# Patient Record
Sex: Male | Born: 1975 | Race: White | Hispanic: No | Smoking: Heavy tobacco smoker
Health system: Southern US, Community
[De-identification: ages and names within clinical notes are randomized; demographics above are authoritative.]

## PROBLEM LIST (undated history)

## (undated) DIAGNOSIS — M545 Low back pain, unspecified: Secondary | ICD-10-CM

## (undated) DIAGNOSIS — G43909 Migraine, unspecified, not intractable, without status migrainosus: Secondary | ICD-10-CM

## (undated) DIAGNOSIS — F419 Anxiety disorder, unspecified: Secondary | ICD-10-CM

## (undated) DIAGNOSIS — F329 Major depressive disorder, single episode, unspecified: Secondary | ICD-10-CM

## (undated) DIAGNOSIS — E785 Hyperlipidemia, unspecified: Secondary | ICD-10-CM

## (undated) DIAGNOSIS — F32A Depression, unspecified: Secondary | ICD-10-CM

## (undated) DIAGNOSIS — I10 Essential (primary) hypertension: Secondary | ICD-10-CM

## (undated) DIAGNOSIS — G8929 Other chronic pain: Secondary | ICD-10-CM

## (undated) DIAGNOSIS — Z720 Tobacco use: Secondary | ICD-10-CM

## (undated) DIAGNOSIS — I2510 Atherosclerotic heart disease of native coronary artery without angina pectoris: Secondary | ICD-10-CM

## (undated) DIAGNOSIS — K219 Gastro-esophageal reflux disease without esophagitis: Secondary | ICD-10-CM

## (undated) HISTORY — DX: Anxiety disorder, unspecified: F41.9

## (undated) HISTORY — PX: MYRINGOTOMY WITH TUBE PLACEMENT: SHX5663

---

## 1998-01-01 ENCOUNTER — Emergency Department (HOSPITAL_COMMUNITY): Admission: EM | Admit: 1998-01-01 | Discharge: 1998-01-01 | Payer: Self-pay | Admitting: Emergency Medicine

## 2000-04-03 ENCOUNTER — Emergency Department (HOSPITAL_COMMUNITY): Admission: EM | Admit: 2000-04-03 | Discharge: 2000-04-03 | Payer: Self-pay | Admitting: Emergency Medicine

## 2008-02-14 ENCOUNTER — Emergency Department (HOSPITAL_COMMUNITY): Admission: EM | Admit: 2008-02-14 | Discharge: 2008-02-14 | Payer: Self-pay | Admitting: Emergency Medicine

## 2010-04-25 LAB — DIFFERENTIAL
Basophils Absolute: 0 10*3/uL (ref 0.0–0.1)
Basophils Relative: 0 % (ref 0–1)
Eosinophils Absolute: 0.1 10*3/uL (ref 0.0–0.7)
Eosinophils Relative: 1 % (ref 0–5)
Lymphocytes Relative: 35 % (ref 12–46)

## 2010-04-25 LAB — CBC
HCT: 51.2 % (ref 39.0–52.0)
MCHC: 33.6 g/dL (ref 30.0–36.0)
Platelets: 210 10*3/uL (ref 150–400)
RDW: 12 % (ref 11.5–15.5)

## 2010-04-25 LAB — POCT CARDIAC MARKERS
CKMB, poc: <1 ng/mL — ABNORMAL LOW (ref 1.0–8.0)
Troponin i, poc: <0.05 ng/mL (ref 0.00–0.09)
Troponin i, poc: <0.05 ng/mL (ref 0.00–0.09)

## 2012-07-10 ENCOUNTER — Encounter (HOSPITAL_COMMUNITY): Payer: Self-pay

## 2012-07-10 ENCOUNTER — Emergency Department (HOSPITAL_COMMUNITY)
Admission: EM | Admit: 2012-07-10 | Discharge: 2012-07-10 | Disposition: A | Discharge status: Home / Self Care | Payer: Self-pay | Attending: Emergency Medicine | Admitting: Emergency Medicine

## 2012-07-10 ENCOUNTER — Emergency Department (HOSPITAL_COMMUNITY): Payer: Self-pay

## 2012-07-10 DIAGNOSIS — G8929 Other chronic pain: Secondary | ICD-10-CM | POA: Insufficient documentation

## 2012-07-10 DIAGNOSIS — R002 Palpitations: Secondary | ICD-10-CM | POA: Insufficient documentation

## 2012-07-10 DIAGNOSIS — I10 Essential (primary) hypertension: Secondary | ICD-10-CM | POA: Insufficient documentation

## 2012-07-10 DIAGNOSIS — R51 Headache: Secondary | ICD-10-CM | POA: Insufficient documentation

## 2012-07-10 DIAGNOSIS — F17200 Nicotine dependence, unspecified, uncomplicated: Secondary | ICD-10-CM | POA: Insufficient documentation

## 2012-07-10 HISTORY — DX: Essential (primary) hypertension: I10

## 2012-07-10 LAB — CBC WITH DIFFERENTIAL/PLATELET
Basophils Absolute: 0 10*3/uL (ref 0.0–0.1)
Basophils Relative: 0 % (ref 0–1)
Eosinophils Absolute: 0.1 10*3/uL (ref 0.0–0.7)
HCT: 46.9 % (ref 39.0–52.0)
Hemoglobin: 16.8 g/dL (ref 13.0–17.0)
Lymphocytes Relative: 37 % (ref 12–46)
Lymphs Abs: 2.9 10*3/uL (ref 0.7–4.0)
Monocytes Absolute: 0.8 10*3/uL (ref 0.1–1.0)
Monocytes Relative: 11 % (ref 3–12)
Neutro Abs: 4 10*3/uL (ref 1.7–7.7)
Neutrophils Relative %: 51 % (ref 43–77)
RBC: 5.44 MIL/uL (ref 4.22–5.81)
WBC: 7.9 10*3/uL (ref 4.0–10.5)

## 2012-07-10 LAB — URINALYSIS, ROUTINE W REFLEX MICROSCOPIC
Glucose, UA: NEGATIVE mg/dL
Ketones, ur: NEGATIVE mg/dL
Leukocytes, UA: NEGATIVE
Protein, ur: NEGATIVE mg/dL

## 2012-07-10 LAB — BASIC METABOLIC PANEL
Calcium: 9.6 mg/dL (ref 8.4–10.5)
GFR calc non Af Amer: >90 mL/min (ref >90)
Glucose, Bld: 118 mg/dL — ABNORMAL HIGH (ref 70–99)
Sodium: 136 mEq/L (ref 135–145)

## 2012-07-10 LAB — TROPONIN I: Troponin I: <0.3 ng/mL (ref <0.30)

## 2012-07-10 MED ORDER — ACETAMINOPHEN 325 MG PO TABS
650.0000 mg | ORAL_TABLET | Freq: Once | ORAL | Status: AC
  Administered 2012-07-10: 650 mg via ORAL
  Filled 2012-07-10: qty 2

## 2012-07-10 MED ORDER — METOPROLOL TARTRATE 25 MG PO TABS
25.0000 mg | ORAL_TABLET | Freq: Two times a day (BID) | ORAL | Status: DC
  Administered 2012-07-10: 25 mg via ORAL
  Filled 2012-07-10: qty 1

## 2012-07-10 MED ORDER — LORAZEPAM 2 MG/ML IJ SOLN
0.5000 mg | Freq: Once | INTRAMUSCULAR | Status: AC
  Administered 2012-07-10: 0.5 mg via INTRAVENOUS
  Filled 2012-07-10: qty 1

## 2012-07-10 MED ORDER — METOPROLOL TARTRATE 25 MG PO TABS: 25.0000 mg | ORAL_TABLET | Freq: Two times a day (BID) | ORAL | Status: DC

## 2012-07-10 NOTE — ED Provider Notes (Signed)
History    CSN: [id number] Arrival date & time 07/10/12  0428  First MD Initiated Contact with Patient 07/10/12 [phone]     Chief Complaint  Patient presents with  . Hypertension  . Headache   (Consider location/radiation/quality/duration/timing/severity/associated sxs/prior Treatment) HPI 37 yo male presents to the ER with complaint of headache, palpitations.  Pt has had chronic headaches over the last several months, current headache ongoing for 1 week.  Headache is posterior occiput and upper neck.  Pt has been taking 1-3 goody powders and/or tylenol, ibuprofen daily for months for the headache.  Tonight as he was getting ready for bed he felt his heart racing and could not get comfortable in the bed due to headache.  Pt began to google his symptoms and became concerned that he may have an aneurysm.  Pt has h/o HTN, does not take lisinopril as prescribed.  Took his blood pressure on his home machine but it read EEEE.  Pt called 911 due to feeling dizzy, sob.  He received 324 aspirin from EMS, declined ntg.  Pt denies chest pain, only some arm tingling, dizziness, and palpitations.  Pt smokes 3 ppd, had had 2 cigarettes tonight due to stress/anxiety.  Pt reports his father died at [facility]: [facility] due to heart issues, feels it was due to medical error.  Pt reports his headache for a week has completely resolved.  He is still feeling palpitations.  Left arm and chest numbness comes and goes with position on the bed.  Pt seems very anxious.  Past Medical History  Diagnosis Date  . Hypertension    History reviewed. No pertinent past surgical history. No family history on file. History  Substance Use Topics  . Smoking status: Heavy Tobacco Smoker -- 3.00 packs/day    Types: Cigarettes  . Smokeless tobacco: Not on file  . Alcohol Use: 0.6 oz/week    1 Shots of liquor per week     Comment: tonight    Review of Systems  All other systems reviewed and are negative.    Allergies  Review of patient's  allergies indicates no known allergies.  Home Medications   Current Outpatient Rx  Name  Route  Sig  Dispense  Refill  . Aspirin-Acetaminophen-Caffeine (GOODY HEADACHE PO)   Oral   Take 1 Package by mouth every 8 (eight) hours as needed (headache or pain).          BP 165/111  Pulse 107  Temp(Src) 99.4 F (37.4 C) (Oral)  Resp 18  SpO2 98% Physical Exam  Nursing note and vitals reviewed. Constitutional: He is oriented to person, place, and time. He appears well-developed and well-nourished. He appears distressed.  HENT:  Head: Normocephalic and atraumatic.  Nose: Nose normal.  Mouth/Throat: Oropharynx is clear and moist.  Eyes: Conjunctivae and EOM are normal. Pupils are equal, round, and reactive to light.  Neck: Normal range of motion. Neck supple. No JVD present. No tracheal deviation present. No thyromegaly present.  Cardiovascular: Normal rate, regular rhythm, normal heart sounds and intact distal pulses.  Exam reveals no gallop and no friction rub.   No murmur heard. Pulmonary/Chest: Effort normal. No stridor. No respiratory distress. He has wheezes (very slight end expiratory wheeze). He has no rales. He exhibits no tenderness.  Abdominal: Soft. Bowel sounds are normal. He exhibits no distension and no mass. There is no tenderness. There is no rebound and no guarding.  Musculoskeletal: Normal range of motion. He exhibits no edema and no tenderness.  Lymphadenopathy:    He has no cervical adenopathy.  Neurological: He is alert and oriented to person, place, and time. He has normal reflexes. No cranial nerve deficit. He exhibits normal muscle tone. Coordination normal.  Skin: Skin is dry. No rash noted. No erythema. No pallor.  Psychiatric: His behavior is normal. Judgment and thought content normal.  anxious    ED Course  Procedures (including critical care time) Labs Reviewed  BASIC METABOLIC PANEL - Abnormal; Notable for the following:    Glucose, Bld 118 (*)     All other components within normal limits  CBC WITH DIFFERENTIAL  TROPONIN I  URINALYSIS, ROUTINE W REFLEX MICROSCOPIC  D-DIMER, QUANTITATIVE    Date: 07/10/2012  Rate: 106  Rhythm: sinus tachycardia  QRS Axis: normal  Intervals: normal  ST/T Wave abnormalities: normal  Conduction Disutrbances:none  Narrative Interpretation:   Old EKG Reviewed: none available   Dg Chest 2 View  07/10/2012   *RADIOLOGY REPORT*  Clinical Data: Chest pain.  Left arm pain.  Numbness.  Headache.  CHEST - 2 VIEW  Comparison: None.  Findings: Basilar atelectasis.  No airspace disease.  No effusion. There is no focal consolidation.  Trachea midline.  Mediastinal contours are within normal limits. Monitoring leads are projected over the chest.  Monitoring buttons projects over the chest as well.  IMPRESSION: No acute cardiopulmonary disease.   Original Report Authenticated By: Andreas Newport, M.D.   1. Chronic headache   2. Hypertension     MDM  37 yo male with chronic headaches, tension vs rebound.  Do not feel sxs are c/w aneurysmal type headache.  Palpitations tonight, anxiety most likely.  Will add on ddimer, but feel pt is low risk for PE.  Do not feel sxs are due to atypical ACS.     Olivia Mackie, MD 07/10/12 1800

## 2012-07-10 NOTE — ED Notes (Signed)
Pt st's he is feeling much better 

## 2012-07-10 NOTE — ED Notes (Signed)
D/c I/v 

## 2012-07-10 NOTE — ED Notes (Signed)
Pt to ed from home. Pt sts he was getting ready for bed and felt like his hr was increased.  C/o left arm numbness, headache.  enroute hr 110-114.  Declined nitro but did receive 324mg  asa.

## 2012-07-18 ENCOUNTER — Ambulatory Visit: Payer: Self-pay | Attending: Family Medicine | Admitting: Family Medicine

## 2012-07-18 VITALS — BP 146/101 | HR 105 | Temp 98.8°F | Resp 18 | Ht 69.5 in | Wt 278.0 lb

## 2012-07-18 DIAGNOSIS — K219 Gastro-esophageal reflux disease without esophagitis: Secondary | ICD-10-CM

## 2012-07-18 DIAGNOSIS — I10 Essential (primary) hypertension: Secondary | ICD-10-CM | POA: Insufficient documentation

## 2012-07-18 DIAGNOSIS — F411 Generalized anxiety disorder: Secondary | ICD-10-CM | POA: Insufficient documentation

## 2012-07-18 DIAGNOSIS — G4700 Insomnia, unspecified: Secondary | ICD-10-CM

## 2012-07-18 MED ORDER — LORAZEPAM 0.5 MG PO TABS: 0.5000 mg | ORAL_TABLET | Freq: Three times a day (TID) | ORAL | Status: DC | PRN

## 2012-07-18 MED ORDER — OMEPRAZOLE 20 MG PO CPDR: 20.0000 mg | DELAYED_RELEASE_CAPSULE | Freq: Every day | ORAL | Status: DC

## 2012-07-18 MED ORDER — LISINOPRIL-HYDROCHLOROTHIAZIDE 20-12.5 MG PO TABS: 1.0000 | ORAL_TABLET | Freq: Every day | ORAL | Status: DC

## 2012-07-18 MED ORDER — SERTRALINE HCL 50 MG PO TABS: 50.0000 mg | ORAL_TABLET | Freq: Every day | ORAL | Status: DC

## 2012-07-18 MED ORDER — BUSPIRONE HCL 7.5 MG PO TABS: 7.5000 mg | ORAL_TABLET | Freq: Two times a day (BID) | ORAL | Status: DC

## 2012-07-18 NOTE — Progress Notes (Signed)
Patient presents for hospital follow up for hypertension and anxiety; states he was at [facility] ED 1 week ago after panic attack that woke him up from his sleep; states he had another panic attack 4 days ago and went to [facility] ED with same symptoms. Patient states that he can't sleep and feels guilty about his father dying a few years ago and thinks he is going to die just like his dad.  Patient was given lopressor at [facility] ED; pt states he stopped taking lopressor because it made him feel like he was going to die with headaches.

## 2012-07-18 NOTE — Progress Notes (Signed)
Patient ID: Jason Morris, male   DOB: 11-Jun-1975, 37 y.o.   MRN: [id number]  CC: ER follow up   HPI: Pt reports panic and anxiety.  The patient has had panic episodes in the past.  He had one recently where he was started on medication for palpitations.  He's been taking metoprolol 25 mg twice a day.  He stopped taking the medication because he said it gave him significant side effects and worsening anxiety.  He stopped taking the medication yesterday.  He is now reporting that he is having palpitations.  He reports that his anxiety has been severe.  He also reports that he's having acid reflux symptoms.  He has burning in the center of his chest.  This is usually related to spicy foods.  No Known Allergies Past Medical History  Diagnosis Date  . Hypertension   . Anxiety    Current Outpatient Prescriptions on File Prior to Visit  Medication Sig Dispense Refill  . Aspirin-Acetaminophen-Caffeine (GOODY HEADACHE PO) Take 1 Package by mouth every 8 (eight) hours as needed (headache or pain).       No current facility-administered medications on file prior to visit.   History reviewed. No pertinent family history. History   Social History  . Marital Status: Married    Spouse Name: N/A    Number of Children: N/A  . Years of Education: N/A   Occupational History  . Not on file.   Social History Main Topics  . Smoking status: Heavy Tobacco Smoker -- 3.00 packs/day    Types: Cigarettes  . Smokeless tobacco: Not on file  . Alcohol Use: 0.6 oz/week    1 Shots of liquor per week     Comment: tonight  . Drug Use: Not on file  . Sexually Active: Not on file   Other Topics Concern  . Not on file   Social History Narrative  . No narrative on file    Review of Systems  Constitutional: Negative for fever, chills, diaphoresis, activity change, appetite change and fatigue.  HENT: Negative for ear pain, nosebleeds, congestion, facial swelling, rhinorrhea, neck pain, neck stiffness and ear  discharge.   Eyes: Negative for pain, discharge, redness, itching and visual disturbance.  Respiratory: Negative for cough, choking, chest tightness, shortness of breath, wheezing and stridor.   Cardiovascular: Negative for chest pain, palpitations and leg swelling.  Gastrointestinal: Negative for abdominal distention.  Genitourinary: Negative for dysuria, urgency, frequency, hematuria, flank pain, decreased urine volume, difficulty urinating and dyspareunia.  Musculoskeletal: Negative for back pain, joint swelling, arthralgias and gait problem.  Neurological: Negative for dizziness, tremors, seizures, syncope, facial asymmetry, speech difficulty, weakness, light-headedness, numbness and headaches.  Hematological: Negative for adenopathy. Does not bruise/bleed easily.  Psychiatric/Behavioral: significant anxiety noted  Objective:   Filed Vitals:   07/18/12 1531  BP: 146/101  Pulse: 105  Temp: 98.8 F (37.1 C)  Resp: 18    Physical Exam  Constitutional: Appears anxiouswell-developed and well-nourished. No distress.  HENT: Normocephalic. External right and left ear normal. Oropharynx is clear and moist.  Eyes: Conjunctivae and EOM are normal. PERRLA, no scleral icterus.  Neck: Normal ROM. Neck supple. No JVD. No tracheal deviation. No thyromegaly.  CVS: RRR, S1/S2 +, no murmurs, no gallops, no carotid bruit.  Pulmonary: Effort and breath sounds normal, no stridor, rhonchi, wheezes, rales.  Abdominal: Soft. BS +,  no distension, tenderness, rebound or guarding.  Musculoskeletal: Normal range of motion. No edema and no tenderness.  Lymphadenopathy: No lymphadenopathy  noted, cervical, inguinal. Neuro: Alert. Normal reflexes, muscle tone coordination. No cranial nerve deficit. Skin: Skin is warm and dry. No rash noted. Not diaphoretic. No erythema. No pallor.  Psychiatric: anxious young male, judgment, thought content normal.   Lab Results  Component Value Date   WBC 7.9 07/10/2012    HGB 16.8 07/10/2012   HCT 46.9 07/10/2012   MCV 86.2 07/10/2012   PLT 193 07/10/2012   Lab Results  Component Value Date   CREATININE 0.96 07/10/2012   BUN 7 07/10/2012   NA 136 07/10/2012   K 4.0 07/10/2012   CL 102 07/10/2012   CO2 23 07/10/2012    No results found for this basename: HGBA1C   Lipid Panel  No results found for this basename: chol, trig, hdl, cholhdl, vldl, ldlcalc     Assessment and plan:   Patient Active Problem List   Diagnosis Date Noted  . GERD (gastroesophageal reflux disease) 07/18/2012  . Unspecified essential hypertension 07/18/2012  . GAD (generalized anxiety disorder) 07/18/2012  . Insomnia 07/18/2012   This patient has untreated anxiety disorder.  He is very skeptical about treating his anxiety disorder at this time.  He is willing to try something to get some relief.  He has cut down on smoking cigarettes significantly.  We'll try sertraline 25 mg by mouth daily for 3 days increasing to 50 mg by mouth daily.  In addition have added buspirone 7.5 mg by mouth twice a day.  I also gave him a prescription for emergency lorazepam to use as needed for severe anxiety.  I gave him 20 tablets to use to try and avoid him having to go into the hospital for a panic episode.  In addition, I added omeprazole 20 mg by mouth daily for acid reflux treatment.  We also gave him a different blood pressure medication.  He will try Zestoretic 20/12.5 one by mouth daily.  He reports  he is taking this in the past and tolerated it for his hypertension.  Close followup with the patient recommended.I would like for him to return to clinic in the next few weeks for recheck.  the patient verbalized understanding  RTC scheduled  The patient was given clear instructions to go to ER or return to medical center if symptoms don't improve, worsen or new problems develop.  The patient verbalized understanding.  The patient was told to call to get any lab results if not heard anything in the next week.     Rodney Langton, MD, CDE, FAAFP [facility] [place], [place]

## 2012-07-18 NOTE — Patient Instructions (Addendum)
Diet for Gastroesophageal Reflux Disease, Adult Reflux (acid reflux) is when acid from your stomach flows up into the esophagus. When acid comes in contact with the esophagus, the acid causes irritation and soreness (inflammation) in the esophagus. When reflux happens often or so severely that it causes damage to the esophagus, it is called gastroesophageal reflux disease (GERD). Nutrition therapy can help ease the discomfort of GERD. FOODS OR DRINKS TO AVOID OR LIMIT  Smoking or chewing tobacco. Nicotine is one of the most potent stimulants to acid production in the gastrointestinal tract.  Caffeinated and decaffeinated coffee and black tea.  Regular or low-calorie carbonated beverages or energy drinks (caffeine-free carbonated beverages are allowed).   Strong spices, such as black pepper, white pepper, red pepper, cayenne, curry powder, and chili powder.  Peppermint or spearmint.  Chocolate.  High-fat foods, including meats and fried foods. Extra added fats including oils, butter, salad dressings, and nuts. Limit these to less than 8 tsp per day.  Fruits and vegetables if they are not tolerated, such as citrus fruits or tomatoes.  Alcohol.  Any food that seems to aggravate your condition. If you have questions regarding your diet, call your caregiver or a registered dietitian. OTHER THINGS THAT MAY HELP GERD INCLUDE:   Eating your meals slowly, in a relaxed setting.  Eating 5 to 6 small meals per day instead of 3 large meals.  Eliminating food for a period of time if it causes distress.  Not lying down until 3 hours after eating a meal.  Keeping the head of your bed raised 6 to 9 inches (15 to 23 cm) by using a foam wedge or blocks under the legs of the bed. Lying flat may make symptoms worse.  Being physically active. Weight loss may be helpful in reducing reflux in overweight or obese adults.  Wear loose fitting clothing EXAMPLE MEAL PLAN This meal plan is approximately  2,000 calories based on https://www.bernard.org/ meal planning guidelines. Breakfast   cup cooked oatmeal.  1 cup strawberries.  1 cup low-fat milk.  1 oz almonds. Snack  1 cup cucumber slices.  6 oz yogurt (made from low-fat or fat-free milk). Lunch  2 slice whole-wheat bread.  2 oz sliced [place].  2 tsp mayonnaise.  1 cup blueberries.  1 cup snap peas. Snack  6 whole-wheat crackers.  1 oz string cheese. Dinner   cup brown rice.  1 cup mixed veggies.  1 tsp olive oil.  3 oz grilled fish. Document Released: 12/25/2004 Document Revised: 03/19/2011 Document Reviewed: 11/10/2010 [facility] Patient Information 2014 [place], [place]. Hypertension As your heart beats, it forces blood through your arteries. This force is your blood pressure. If the pressure is too high, it is called hypertension (HTN) or high blood pressure. HTN is dangerous because you may have it and not know it. High blood pressure may mean that your heart has to work harder to pump blood. Your arteries may be narrow or stiff. The extra work puts you at risk for heart disease, stroke, and other problems.  Blood pressure consists of two numbers, a higher number over a lower, 110/72, for example. It is stated as "110 over 72." The ideal is below 120 for the top number (systolic) and under 80 for the bottom (diastolic). Write down your blood pressure today. You should pay close attention to your blood pressure if you have certain conditions such as:  Heart failure.  Prior heart attack.  Diabetes  Chronic kidney disease.  Prior stroke.  Multiple risk factors for heart disease. To see if you have HTN, your blood pressure should be measured while you are seated with your arm held at the level of the heart. It should be measured at least twice. A one-time elevated blood pressure reading (especially in the Emergency Department) does not mean that you need treatment. There may be conditions in which the blood  pressure is different between your right and left arms. It is important to see your caregiver soon for a recheck. Most people have essential hypertension which means that there is not a specific cause. This type of high blood pressure may be lowered by changing lifestyle factors such as:  Stress.  Smoking.  Lack of exercise.  Excessive weight.  Drug/tobacco/alcohol use.  Eating less salt. Most people do not have symptoms from high blood pressure until it has caused damage to the body. Effective treatment can often prevent, delay or reduce that damage. TREATMENT  When a cause has been identified, treatment for high blood pressure is directed at the cause. There are a large number of medications to treat HTN. These fall into several categories, and your caregiver will help you select the medicines that are best for you. Medications may have side effects. You should review side effects with your caregiver. If your blood pressure stays high after you have made lifestyle changes or started on medicines,   Your medication(s) may need to be changed.  Other problems may need to be addressed.  Be certain you understand your prescriptions, and know how and when to take your medicine.  Be sure to follow up with your caregiver within the time frame advised (usually within two weeks) to have your blood pressure rechecked and to review your medications.  If you are taking more than one medicine to lower your blood pressure, make sure you know how and at what times they should be taken. Taking two medicines at the same time can result in blood pressure that is too low. SEEK IMMEDIATE MEDICAL CARE IF:  You develop a severe headache, blurred or changing vision, or confusion.  You have unusual weakness or numbness, or a faint feeling.  You have severe chest or abdominal pain, vomiting, or breathing problems. MAKE SURE YOU:   Understand these instructions.  Will watch your condition.  Will get  help right away if you are not doing well or get worse. Document Released: 12/25/2004 Document Revised: 03/19/2011 Document Reviewed: 08/15/2007 [facility] Patient Information 2014 [place], [place]. Anxiety and Panic Attacks Your caregiver has informed you that you are having an anxiety or panic attack. There may be many forms of this. Most of the time these attacks come suddenly and without warning. They come at any time of day, including periods of sleep, and at any time of life. They may be strong and unexplained. Although panic attacks are very scary, they are physically harmless. Sometimes the cause of your anxiety is not known. Anxiety is a protective mechanism of the body in its fight or flight mechanism. Most of these perceived danger situations are actually nonphysical situations (such as anxiety over losing a job). CAUSES  The causes of an anxiety or panic attack are many. Panic attacks may occur in otherwise healthy people given a certain set of circumstances. There may be a genetic cause for panic attacks. Some medications may also have anxiety as a side effect. SYMPTOMS  Some of the most common feelings are:  Intense terror.  Dizziness, feeling faint.  Hot and cold  flashes.  Fear of going crazy.  Feelings that nothing is real.  Sweating.  Shaking.  Chest pain or a fast heartbeat (palpitations).  Smothering, choking sensations.  Feelings of impending doom and that death is near.  Tingling of extremities, this may be from over-breathing.  Altered reality (derealization).  Being detached from yourself (depersonalization). Several symptoms can be present to make up anxiety or panic attacks. DIAGNOSIS  The evaluation by your caregiver will depend on the type of symptoms you are experiencing. The diagnosis of anxiety or panic attack is made when no physical illness can be determined to be a cause of the symptoms. TREATMENT  Treatment to prevent anxiety and panic attacks may  include:  Avoidance of circumstances that cause anxiety.  Reassurance and relaxation.  Regular exercise.  Relaxation therapies, such as yoga.  Psychotherapy with a psychiatrist or therapist.  Avoidance of caffeine, alcohol and illegal drugs.  Prescribed medication. SEEK IMMEDIATE MEDICAL CARE IF:   You experience panic attack symptoms that are different than your usual symptoms.  You have any worsening or concerning symptoms. Document Released: 12/25/2004 Document Revised: 03/19/2011 Document Reviewed: 04/28/2009 [facility] Patient Information 2014 [place], [place].

## 2012-07-23 ENCOUNTER — Ambulatory Visit: Payer: Self-pay

## 2012-07-25 ENCOUNTER — Emergency Department (HOSPITAL_COMMUNITY)
Admission: EM | Admit: 2012-07-25 | Discharge: 2012-07-25 | Disposition: A | Discharge status: Home / Self Care | Payer: Self-pay | Attending: Emergency Medicine | Admitting: Emergency Medicine

## 2012-07-25 ENCOUNTER — Emergency Department (HOSPITAL_COMMUNITY): Payer: Self-pay

## 2012-07-25 ENCOUNTER — Encounter (HOSPITAL_COMMUNITY): Payer: Self-pay | Admitting: Family Medicine

## 2012-07-25 DIAGNOSIS — I10 Essential (primary) hypertension: Secondary | ICD-10-CM | POA: Insufficient documentation

## 2012-07-25 DIAGNOSIS — R079 Chest pain, unspecified: Secondary | ICD-10-CM

## 2012-07-25 DIAGNOSIS — F411 Generalized anxiety disorder: Secondary | ICD-10-CM | POA: Insufficient documentation

## 2012-07-25 DIAGNOSIS — R0789 Other chest pain: Secondary | ICD-10-CM | POA: Insufficient documentation

## 2012-07-25 DIAGNOSIS — Z79899 Other long term (current) drug therapy: Secondary | ICD-10-CM | POA: Insufficient documentation

## 2012-07-25 DIAGNOSIS — R51 Headache: Secondary | ICD-10-CM | POA: Insufficient documentation

## 2012-07-25 DIAGNOSIS — F419 Anxiety disorder, unspecified: Secondary | ICD-10-CM

## 2012-07-25 DIAGNOSIS — R110 Nausea: Secondary | ICD-10-CM | POA: Insufficient documentation

## 2012-07-25 DIAGNOSIS — F17200 Nicotine dependence, unspecified, uncomplicated: Secondary | ICD-10-CM | POA: Insufficient documentation

## 2012-07-25 LAB — BASIC METABOLIC PANEL
BUN: 8 mg/dL (ref 6–23)
Calcium: 9.8 mg/dL (ref 8.4–10.5)
Chloride: 97 mEq/L (ref 96–112)
Creatinine, Ser: 0.98 mg/dL (ref 0.50–1.35)
GFR calc Af Amer: >90 mL/min (ref >90)
GFR calc non Af Amer: >90 mL/min (ref >90)

## 2012-07-25 LAB — CBC
HCT: 46 % (ref 39.0–52.0)
MCHC: 36.5 g/dL — ABNORMAL HIGH (ref 30.0–36.0)
MCV: 85.2 fL (ref 78.0–100.0)
Platelets: 242 10*3/uL (ref 150–400)
RDW: 11.8 % (ref 11.5–15.5)
WBC: 10.9 10*3/uL — ABNORMAL HIGH (ref 4.0–10.5)

## 2012-07-25 LAB — POCT I-STAT TROPONIN I: Troponin i, poc: 0.01 ng/mL (ref 0.00–0.08)

## 2012-07-25 MED ORDER — GI COCKTAIL ~~LOC~~
30.0000 mL | Freq: Once | ORAL | Status: AC
  Administered 2012-07-25: 30 mL via ORAL
  Filled 2012-07-25: qty 30

## 2012-07-25 NOTE — ED Notes (Signed)
Pt c/o weakness x1 day, pt reports the weakness started after becoming "anxious" while inside a crowded [employer], pt states he has had several similar episodes over the last 2 weeks and symptoms resolved with Ativan. Pt also c/o intermittent chest wall pain x2 weeks and a posterior headache x 1 month. Pt reports his chest pain resolves w/antacid medications

## 2012-07-25 NOTE — ED Provider Notes (Signed)
History    CSN: [id number] Arrival date & time 07/25/12  1251  First MD Initiated Contact with Patient 07/25/12 1756     Chief Complaint  Patient presents with  . Weakness   (Consider location/radiation/quality/duration/timing/severity/associated sxs/prior Treatment) HPI Comments: Patient presents with multiple complaints. Patient complains of occipital headache that he has had intermittently for the past several weeks. This has not been associated with vision change but the patient has had intermittent left-sided upper extremity and lower extremity weakness that has been improved with Ativan. He is uncertain but thinks it may be related to panic attacks. Patient also complains of intermittent, left-sided, burning chest pain that does not radiate. Patient has a history of hypertension and strong family history of coronary artery disease with several first and second degree male relatives who had heart attacks in their 15s. He denies nausea or shortness of breath. Patient has had recent ED visit for panic attack and palpitations. Patient saw internal medicine physician for followup and was started on Zoloft and that his blood pressure medication changed. He is afraid that he has a brain tumor, brain aneurysm, and is having a heart attack. The onset of this condition was acute. The course is intermittent. Aggravating factors: none. Alleviating factors: benzo.    Patient is a 37 y.o. male presenting with weakness. The history is provided by the patient and medical records.  Weakness Associated symptoms include chest pain, headaches, nausea and weakness. Pertinent negatives include no abdominal pain, congestion, coughing, diaphoresis, fever, neck pain, numbness, rash or vomiting.   Past Medical History  Diagnosis Date  . Hypertension   . Anxiety    History reviewed. No pertinent past surgical history. History reviewed. No pertinent family history. History  Substance Use Topics  . Smoking  status: Heavy Tobacco Smoker -- 3.00 packs/day    Types: Cigarettes  . Smokeless tobacco: Not on file  . Alcohol Use: 0.6 oz/week    1 Shots of liquor per week     Comment: tonight    Review of Systems  Constitutional: Negative for fever and diaphoresis.  HENT: Negative for congestion, rhinorrhea, neck pain, neck stiffness, dental problem and sinus pressure.   Eyes: Negative for photophobia, discharge, redness and visual disturbance.  Respiratory: Negative for cough and shortness of breath.   Cardiovascular: Positive for chest pain. Negative for palpitations and leg swelling.  Gastrointestinal: Positive for nausea. Negative for vomiting and abdominal pain.  Genitourinary: Negative for dysuria.  Musculoskeletal: Negative for back pain and gait problem.  Skin: Negative for rash.  Neurological: Positive for weakness and headaches. Negative for syncope, speech difficulty, light-headedness and numbness.  Psychiatric/Behavioral: Negative for confusion. The patient is nervous/anxious.     Allergies  Lopressor  Home Medications   Current Outpatient Rx  Name  Route  Sig  Dispense  Refill  . Aspirin-Acetaminophen-Caffeine (GOODY HEADACHE PO)   Oral   Take 1 Package by mouth every 8 (eight) hours as needed (headache or pain).         . busPIRone (BUSPAR) 7.5 MG tablet   Oral   Take 1 tablet (7.5 mg total) by mouth 2 (two) times daily.   60 tablet   1   . lisinopril-hydrochlorothiazide (ZESTORETIC) 20-12.5 MG per tablet   Oral   Take 1 tablet by mouth daily.   30 tablet   3   . LORazepam (ATIVAN) 0.5 MG tablet   Oral   Take 1 tablet (0.5 mg total) by mouth every 8 (eight) hours  as needed for anxiety.   20 tablet   0   . omeprazole (PRILOSEC) 20 MG capsule   Oral   Take 1 capsule (20 mg total) by mouth daily.   30 capsule   3   . ranitidine (ZANTAC) 150 MG tablet   Oral   Take 150 mg by mouth 2 (two) times daily as needed for heartburn.         . sertraline  (ZOLOFT) 50 MG tablet   Oral   Take 1 tablet (50 mg total) by mouth daily. Take 1/2 tab po daily for 3 days, then take 1 tab po daily   30 tablet   2    BP 136/84  Pulse 95  Temp(Src) 98.5 F (36.9 C) (Oral)  Resp 18  SpO2 97% Physical Exam  Nursing note and vitals reviewed. Constitutional: He is oriented to person, place, and time. He appears well-developed and well-nourished.  HENT:  Head: Normocephalic and atraumatic.  Right Ear: Tympanic membrane, external ear and ear canal normal.  Left Ear: Tympanic membrane, external ear and ear canal normal.  Nose: Nose normal.  Mouth/Throat: Uvula is midline, oropharynx is clear and moist and mucous membranes are normal. Mucous membranes are not dry.  Eyes: Conjunctivae, EOM and lids are normal. Pupils are equal, round, and reactive to light.  Neck: Trachea normal and normal range of motion. Neck supple. Normal carotid pulses and no JVD present. No muscular tenderness present. Carotid bruit is not present. No tracheal deviation present.  Cardiovascular: Normal rate, regular rhythm, S1 normal, S2 normal, normal heart sounds and intact distal pulses.  Exam reveals no distant heart sounds and no decreased pulses.   No murmur heard. Pulmonary/Chest: Effort normal and breath sounds normal. No respiratory distress. He has no wheezes. He exhibits no tenderness.  Abdominal: Soft. Normal aorta and bowel sounds are normal. There is no tenderness. There is no rebound and no guarding.  Musculoskeletal: Normal range of motion. He exhibits no edema.       Cervical back: He exhibits normal range of motion, no tenderness and no bony tenderness.  Neurological: He is alert and oriented to person, place, and time. He has normal strength and normal reflexes. No cranial nerve deficit or sensory deficit. He exhibits normal muscle tone. He displays a negative Romberg sign. Coordination and gait normal. GCS eye subscore is 4. GCS verbal subscore is 5. GCS motor  subscore is 6.  Skin: Skin is warm and dry. He is not diaphoretic. No cyanosis. No pallor.  Psychiatric: His mood appears anxious.    ED Course  Procedures (including critical care time) Labs Reviewed  CBC  BASIC METABOLIC PANEL   Ct Head Wo Contrast  07/25/2012   *RADIOLOGY REPORT*  Clinical Data: Headache, weakness, anxious  CT HEAD WITHOUT CONTRAST  Technique:  Contiguous axial images were obtained from the base of the skull through the vertex without contrast.  Comparison: None.  Findings: No evidence of parenchymal hemorrhage or extra-axial fluid collection. No mass lesion, mass effect, or midline shift.  No CT evidence of acute infarction.  Cerebral volume is age appropriate.  No ventriculomegaly.  The visualized paranasal sinuses are essentially clear. The mastoid air cells are unopacified.  No evidence of calvarial fracture.  IMPRESSION: Normal head CT.   Original Report Authenticated By: Charline Bills, M.D.   1. Anxiety   2. Chest pain   3. Headache     7:54 PM Patient seen and examined. Work-up initiated. Medications ordered. Patient  is very anxious. Discussed risks and benefits of head CT including radiation, possible need for further testing if negative. He wants a head CT.    Vital signs reviewed and are as follows: Filed Vitals:   07/25/12 1805  BP: 136/84  Pulse: 95  Temp:   Resp: 18    Date: 07/25/2012  Rate: 99  Rhythm: normal sinus rhythm  QRS Axis: normal  Intervals: normal  ST/T Wave abnormalities: nonspecific T wave changes  Conduction Disutrbances:none  Narrative Interpretation:   Old EKG Reviewed: changes noted, new borderline t-wave changes since 07/10/12  EKG d/w Dr. Rosalia Hammers.   8:19 PM CT negative, patient informed. He appears relieved. Awaiting blood test results. Patient reports much improvement in chest pain no GI cocktail.  Handoff to Dammen PA-C at shift change.   Plan: F/u on blood tests. If neg, d/c to home with PCP f/u.    MDM  Patient  with multiple complaints. Patient does have atypical chest pain and I suspect GERD/anxiety. Borderline EKG changes today. Chest pain was improved in emergency department with GI cocktail. Given family history, outpatient PCP followup is indicated. Patient does have PCP to follow up with. I do not suspect ACS as cause of his atypical pain. He is to continue Prilosec.  Patient previously had a d-dimer which was negative. Troponin today is pending.  Headache and weakness. CT was performed at patient request. This was negative the patient seems relieved. Do not suspect serious intracranial etiology such as stroke as symptoms were relieved with Ativan. Suspect anxiety as underlying cause. Again, agree with Zoloft and PCP followup. He may need MRI or neurology followup if headaches persist. Do not suspect meningitis.   Renne Crigler, PA-C 07/25/12 2024

## 2012-07-25 NOTE — ED Notes (Signed)
Pt dc'd home w/all belongings, alert and ambulatory upon dc, no new rx prescribed, pt driven home by family, pt verbalizes understanding of dc instructions

## 2012-07-25 NOTE — ED Notes (Signed)
Pt states he is scared to stay at home alone, states "I don't want to die in my sleep."

## 2012-07-25 NOTE — ED Provider Notes (Signed)
Jason Morris 8:00PM Pt discussed in sign out.  Pt with multiple complaints.  Unremarkable vital signs.  No concerning exam findings.  Head CT and CXR unremarkable.  Basic labs pending.  Labs unremarkable. Patient is reassured with the findings. He will plan to followup with PCP for continued evaluation and treatment.  Angus Seller, PA-C 07/26/12 [phone]

## 2012-07-25 NOTE — ED Notes (Addendum)
Per pt sts yesterday he was at [facility] and there were a lot of people and it made him anxious. sts since he has had left sided weakness. sts the weakness has some what resolved and when he walks he is okay. sts hasn't taken BP meds today. sts Ativan helps. sts he has also been having a lot of HA.

## 2012-07-26 NOTE — ED Provider Notes (Signed)
I have reviewed the report and personally reviewed the above radiology studies.    Rihana Kiddy S Lillyonna Armstead, MD 07/26/12 1508 

## 2012-07-26 NOTE — ED Provider Notes (Signed)
Medical screening examination/treatment/procedure(s) were performed by non-physician practitioner and as supervising physician I was immediately available for consultation/collaboration.    Nelia Shi, MD 07/26/12 1520

## 2012-07-28 ENCOUNTER — Inpatient Hospital Stay: Payer: Self-pay

## 2012-08-08 ENCOUNTER — Encounter (HOSPITAL_COMMUNITY): Payer: Self-pay | Admitting: Radiology

## 2012-08-08 ENCOUNTER — Ambulatory Visit: Payer: Self-pay | Attending: Family Medicine | Admitting: Internal Medicine

## 2012-08-08 ENCOUNTER — Ambulatory Visit: Payer: Self-pay | Attending: Family Medicine

## 2012-08-08 ENCOUNTER — Emergency Department (HOSPITAL_COMMUNITY): Payer: Self-pay

## 2012-08-08 ENCOUNTER — Encounter: Payer: Self-pay | Admitting: Family Medicine

## 2012-08-08 ENCOUNTER — Observation Stay (HOSPITAL_COMMUNITY)
Admission: EM | Admit: 2012-08-08 | Discharge: 2012-08-09 | Disposition: A | Discharge status: Home / Self Care | Payer: MEDICAID | Attending: Internal Medicine | Admitting: Internal Medicine

## 2012-08-08 VITALS — BP 141/88 | HR 88 | Temp 97.6°F | Resp 16 | Wt 265.6 lb

## 2012-08-08 DIAGNOSIS — F411 Generalized anxiety disorder: Secondary | ICD-10-CM | POA: Insufficient documentation

## 2012-08-08 DIAGNOSIS — I2584 Coronary atherosclerosis due to calcified coronary lesion: Secondary | ICD-10-CM | POA: Insufficient documentation

## 2012-08-08 DIAGNOSIS — R079 Chest pain, unspecified: Secondary | ICD-10-CM

## 2012-08-08 DIAGNOSIS — Z8249 Family history of ischemic heart disease and other diseases of the circulatory system: Secondary | ICD-10-CM | POA: Insufficient documentation

## 2012-08-08 DIAGNOSIS — I2510 Atherosclerotic heart disease of native coronary artery without angina pectoris: Secondary | ICD-10-CM | POA: Insufficient documentation

## 2012-08-08 DIAGNOSIS — M549 Dorsalgia, unspecified: Secondary | ICD-10-CM | POA: Insufficient documentation

## 2012-08-08 DIAGNOSIS — G8929 Other chronic pain: Secondary | ICD-10-CM | POA: Insufficient documentation

## 2012-08-08 DIAGNOSIS — Z23 Encounter for immunization: Secondary | ICD-10-CM | POA: Insufficient documentation

## 2012-08-08 DIAGNOSIS — F17200 Nicotine dependence, unspecified, uncomplicated: Secondary | ICD-10-CM | POA: Insufficient documentation

## 2012-08-08 DIAGNOSIS — I10 Essential (primary) hypertension: Secondary | ICD-10-CM | POA: Insufficient documentation

## 2012-08-08 DIAGNOSIS — I2541 Coronary artery aneurysm: Secondary | ICD-10-CM | POA: Insufficient documentation

## 2012-08-08 HISTORY — DX: Depression, unspecified: F32.A

## 2012-08-08 HISTORY — DX: Migraine, unspecified, not intractable, without status migrainosus: G43.909

## 2012-08-08 HISTORY — DX: Gastro-esophageal reflux disease without esophagitis: K21.9

## 2012-08-08 HISTORY — DX: Low back pain, unspecified: M54.50

## 2012-08-08 HISTORY — DX: Hyperlipidemia, unspecified: E78.5

## 2012-08-08 HISTORY — DX: Other chronic pain: G89.29

## 2012-08-08 HISTORY — DX: Major depressive disorder, single episode, unspecified: F32.9

## 2012-08-08 HISTORY — DX: Low back pain: M54.5

## 2012-08-08 HISTORY — DX: Atherosclerotic heart disease of native coronary artery without angina pectoris: I25.10

## 2012-08-08 LAB — POCT I-STAT TROPONIN I: Troponin i, poc: 0 ng/mL (ref 0.00–0.08)

## 2012-08-08 LAB — RAPID URINE DRUG SCREEN, HOSP PERFORMED
Barbiturates: POSITIVE — AB
Benzodiazepines: NOT DETECTED

## 2012-08-08 LAB — CBC
MCH: 30.8 pg (ref 26.0–34.0)
MCHC: 35.5 g/dL (ref 30.0–36.0)
MCV: 86.6 fL (ref 78.0–100.0)
Platelets: 213 10*3/uL (ref 150–400)
RBC: 4.91 MIL/uL (ref 4.22–5.81)
RDW: 12.1 % (ref 11.5–15.5)

## 2012-08-08 LAB — BASIC METABOLIC PANEL
CO2: 27 mEq/L (ref 19–32)
Calcium: 9.9 mg/dL (ref 8.4–10.5)
Creatinine, Ser: 1.02 mg/dL (ref 0.50–1.35)
GFR calc non Af Amer: >90 mL/min (ref >90)
Sodium: 135 mEq/L (ref 135–145)

## 2012-08-08 LAB — TROPONIN I: Troponin I: <0.3 ng/mL (ref <0.30)

## 2012-08-08 MED ORDER — IOHEXOL 350 MG/ML SOLN: 100.0000 mL | Freq: Once | INTRAVENOUS | Status: AC | PRN

## 2012-08-08 MED ORDER — LORAZEPAM 1 MG PO TABS: 1.0000 mg | ORAL_TABLET | Freq: Two times a day (BID) | ORAL | Status: DC

## 2012-08-08 MED ORDER — LORAZEPAM 1 MG PO TABS
1.0000 mg | ORAL_TABLET | Freq: Two times a day (BID) | ORAL | Status: DC
  Administered 2012-08-08: 1 mg via ORAL
  Filled 2012-08-08: qty 1

## 2012-08-08 MED ORDER — GI COCKTAIL ~~LOC~~
30.0000 mL | Freq: Once | ORAL | Status: AC
  Administered 2012-08-08: 30 mL via ORAL
  Filled 2012-08-08: qty 30

## 2012-08-08 MED ORDER — NICOTINE 21 MG/24HR TD PT24
21.0000 mg | MEDICATED_PATCH | Freq: Once | TRANSDERMAL | Status: DC
  Administered 2012-08-08: 21 mg via TRANSDERMAL
  Filled 2012-08-08 (×2): qty 1

## 2012-08-08 MED ORDER — LORAZEPAM 1 MG PO TABS
0.5000 mg | ORAL_TABLET | Freq: Once | ORAL | Status: AC
  Administered 2012-08-08: 0.5 mg via ORAL
  Filled 2012-08-08 (×2): qty 1

## 2012-08-08 MED ORDER — IOHEXOL 350 MG/ML SOLN
100.0000 mL | Freq: Once | INTRAVENOUS | Status: AC | PRN
  Administered 2012-08-08: 100 mL via INTRAVENOUS

## 2012-08-08 NOTE — Progress Notes (Signed)
Patient ID: Jason Morris, male   DOB: June 30, 1975, 37 y.o.   MRN: [id number]  CC:  HPI:  37 year old male who presents to the clinic for evaluation of his chest pain. He states that the chest pain has been there for 2 or 3 days, he describes as pain between her shoulder blades with radiation into the left arm, worse with exertion. The patient was recently in the ER on 7/18 and was diagnosed with panic attacks. The patient has several family members with coronary artery disease and heart attacks in their 38s. The patient has never had a stress test done. EKG in the clinic appears to be normal He denies to any ongoing drug use He states that he recently lost his job because of his health issues On 7/18 he had a normal EKG, negative d-dimer, negative troponin, CT of the head which was negative    Allergies  Allergen Reactions  . Lopressor (Metoprolol Tartrate) Other (See Comments)    Felt like he was going to die , could not breath   Past Medical History  Diagnosis Date  . Hypertension   . Anxiety    Current Outpatient Prescriptions on File Prior to Visit  Medication Sig Dispense Refill  . Aspirin-Acetaminophen-Caffeine (GOODY HEADACHE PO) Take 1 Package by mouth every 8 (eight) hours as needed (headache or pain).      . busPIRone (BUSPAR) 7.5 MG tablet Take 1 tablet (7.5 mg total) by mouth 2 (two) times daily.  60 tablet  1  . lisinopril-hydrochlorothiazide (ZESTORETIC) 20-12.5 MG per tablet Take 1 tablet by mouth daily.  30 tablet  3  . LORazepam (ATIVAN) 0.5 MG tablet Take 1 tablet (0.5 mg total) by mouth every 8 (eight) hours as needed for anxiety.  20 tablet  0  . omeprazole (PRILOSEC) 20 MG capsule Take 1 capsule (20 mg total) by mouth daily.  30 capsule  3  . ranitidine (ZANTAC) 150 MG tablet Take 150 mg by mouth 2 (two) times daily as needed for heartburn.      . sertraline (ZOLOFT) 50 MG tablet Take 1 tablet (50 mg total) by mouth daily. Take 1/2 tab po daily for 3 days, then take  1 tab po daily  30 tablet  2   No current facility-administered medications on file prior to visit.   History reviewed. No pertinent family history. History   Social History  . Marital Status: Divorced    Spouse Name: N/A    Number of Children: N/A  . Years of Education: N/A   Occupational History  . Not on file.   Social History Main Topics  . Smoking status: Heavy Tobacco Smoker -- 3.00 packs/day    Types: Cigarettes  . Smokeless tobacco: Not on file  . Alcohol Use: 0.6 oz/week    1 Shots of liquor per week     Comment: tonight  . Drug Use: Not on file  . Sexually Active: Not on file   Other Topics Concern  . Not on file   Social History Narrative  . No narrative on file    Review of Systems  Constitutional: Negative for fever, chills, diaphoresis, activity change, appetite change and fatigue.  HENT: Negative for ear pain, nosebleeds, congestion, facial swelling, rhinorrhea, neck pain, neck stiffness and ear discharge.   Eyes: Negative for pain, discharge, redness, itching and visual disturbance.  Respiratory: Negative for cough, choking, chest tightness, shortness of breath, wheezing and stridor.   Cardiovascular: Negative for chest pain, palpitations and  leg swelling.  Gastrointestinal: Negative for abdominal distention.  Genitourinary: Negative for dysuria, urgency, frequency, hematuria, flank pain, decreased urine volume, difficulty urinating and dyspareunia.  Musculoskeletal: Negative for back pain, joint swelling, arthralgias and gait problem.  Neurological: Negative for dizziness, tremors, seizures, syncope, facial asymmetry, speech difficulty, weakness, light-headedness, numbness and headaches.  Hematological: Negative for adenopathy. Does not bruise/bleed easily.  Psychiatric/Behavioral: Negative for hallucinations, behavioral problems, confusion, dysphoric mood, decreased concentration and agitation.    Objective:   Filed Vitals:   08/08/12 1325  BP:  141/88  Pulse: 88  Temp: 97.6 F (36.4 C)  Resp: 16    Physical Exam  Constitutional: Appears well-developed and well-nourished. No distress.  HENT: Normocephalic. External right and left ear normal. Oropharynx is clear and moist.  Eyes: Conjunctivae and EOM are normal. PERRLA, no scleral icterus.  Neck: Normal ROM. Neck supple. No JVD. No tracheal deviation. No thyromegaly.  CVS: RRR, S1/S2 +, no murmurs, no gallops, no carotid bruit.  Pulmonary: Effort and breath sounds normal, no stridor, rhonchi, wheezes, rales.  Abdominal: Soft. BS +,  no distension, tenderness, rebound or guarding.  Musculoskeletal: Normal range of motion. No edema and no tenderness.  Lymphadenopathy: No lymphadenopathy noted, cervical, inguinal. Neuro: Alert. Normal reflexes, muscle tone coordination. No cranial nerve deficit. Skin: Skin is warm and dry. No rash noted. Not diaphoretic. No erythema. No pallor.  Psychiatric: Normal mood and affect. Behavior, judgment, thought content normal.   Lab Results  Component Value Date   WBC 10.9* 07/25/2012   HGB 16.8 07/25/2012   HCT 46.0 07/25/2012   MCV 85.2 07/25/2012   PLT 242 07/25/2012   Lab Results  Component Value Date   CREATININE 0.98 07/25/2012   BUN 8 07/25/2012   NA 134* 07/25/2012   K 3.5 07/25/2012   CL 97 07/25/2012   CO2 27 07/25/2012    No results found for this basename: HGBA1C   Lipid Panel  No results found for this basename: chol, trig, hdl, cholhdl, vldl, ldlcalc       Assessment and plan:   Patient Active Problem List   Diagnosis Date Noted  . GERD (gastroesophageal reflux disease) 07/18/2012  . Unspecified essential hypertension 07/18/2012  . GAD (generalized anxiety disorder) 07/18/2012  . 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

## 2012-08-08 NOTE — ED Notes (Signed)
Pt states that he was diagnosed with HTN and told "not to quit smoking yet because they thought I was having a nervous breakdown.

## 2012-08-08 NOTE — ED Provider Notes (Signed)
CSN: [id number]     Arrival date & time 08/08/12  1416 History     First MD Initiated Contact with Patient 08/08/12 1503     Chief Complaint  Patient presents with  . Chest Pain   (Consider location/radiation/quality/duration/timing/severity/associated sxs/prior Treatment) HPI Complaints of chest pain present for the past 5 days, increasing in intensity, radiates to the back, describes it as a pole shooting straight through to his back, and constricting, worse on sitting and standing, better on lying flat on his back, assoc with palp, and diaphoresis, sense of something bad about to happen. Two days ago, he had an episode of dizziness with the chest pain. Symptoms feel better when he takes ativan. Px also endoses having panic attacks and anxiety, spasms on the left and right part of his temples, with pain near the angle of his left jaw. He previously smoked 3-4 packs a day, now down to 1/2 a pack, hx of HTN takes- HCT, lopressor and lisinopril, denies use of drugs. No cough or leg swelling, no fever, no hx of trauma to chest region. He said his father started having heart problems when he was about 38 years, and finally passed away at 30, also paternal grandfather and greatgrandfather had passed away at the same age. He is very scared he has started having the same cardiac issues as he is about the same age his father started having complaints and he thinks about it all the time.   Past Medical History  Diagnosis Date  . Hypertension   . Anxiety    No past surgical history on file. No family history on file. History  Substance Use Topics  . Smoking status: Heavy Tobacco Smoker -- 3.00 packs/day    Types: Cigarettes  . Smokeless tobacco: Not on file  . Alcohol Use: 0.6 oz/week    1 Shots of liquor per week     Comment: tonight    Review of Systems No pertinent findings on ROS.   Allergies  Lopressor  Home Medications   Current Outpatient Rx  Name  Route  Sig  Dispense  Refill   . lisinopril-hydrochlorothiazide (PRINZIDE,ZESTORETIC) 20-12.5 MG per tablet   Oral   Take 0.5 tablets by mouth 2 (two) times daily.         Marland Kitchen LORazepam (ATIVAN) 0.5 MG tablet   Oral   Take 1 tablet (0.5 mg total) by mouth every 8 (eight) hours as needed for anxiety.   20 tablet   0   . omeprazole (PRILOSEC) 20 MG capsule   Oral   Take 1 capsule (20 mg total) by mouth daily.   30 capsule   3   . sertraline (ZOLOFT) 50 MG tablet   Oral   Take 12.5 mg by mouth daily.          BP 107/69  Pulse 76  Temp(Src) 98.9 F (37.2 C) (Oral)  Resp 20  SpO2 100% Physical Exam Gen- alert, co-op, not in any distress,well built. Cardiac- RRR, no Murmurs, rubs or gallops, chest pain not reproducible on palpation. Resp -Clear to auscultation.  Abd- soft, non tender, no masses or organomegaly, NL bowel sounds Extremities- pulse 2+ symm, normal strenght. Neuro- OTPP, no obvious Cr N abnormalities.  ED Course   Procedures (including critical care time)  Labs Reviewed  CBC  BASIC METABOLIC PANEL  POCT I-STAT TROPONIN I   Dg Chest Port 1 View  08/08/2012   *RADIOLOGY REPORT*  Clinical Data: Chest pain  CHEST - 2  VIEW  Comparison:  07/25/2012  Findings:  The heart size and mediastinal contours are within normal limits.  Both lungs are clear.  The visualized skeletal structures are unremarkable.  IMPRESSION: No active cardiopulmonary disease.   Original Report Authenticated By: Judie Petit. Miles Costain, M.D.   No diagnosis found.  MDM   Chest pain- R/o ACS, aortic dissection- as he says the his pain radiates to his back and he has a hx of hypertension. Chest pain is most likely due to anxiety, px has a hx of GAD, panic attacks. - POC troponin- 1 Negative- 0.00. - EKG stat- rate- 84 bpm, regular, sinus rhythm. - BMP -Oral ativan- 1mg  BID. - chest Xray- no acute cardiopulmonary process, not concerning for aortic dissection. - Chest CT angiogram, R/o PE and Aortic dissection. - Urine drug screen -  Follow up with his PCP, and Psych for treatment treatment of anxiety symptoms. - Follow up stress test.  Kennis Carina, MD 08/08/12 2016

## 2012-08-08 NOTE — ED Notes (Signed)
[facility] with [facility] pain mid shoulder radiates to chest X 4 days. Pt denies SHOB, N, V dizziness, pt states that it "feels like gerd"  Pt has 3254 mg of asa.  20lock in lac. Better when lays down.

## 2012-08-08 NOTE — Progress Notes (Signed)
Patient presents with pain between his shoulder blades that radiates Down his left arm Has pain in the center of his chest-past 4 days Denies any injury

## 2012-08-08 NOTE — ED Provider Notes (Signed)
Jason Morris S 8:00 PM patient discussed in sign out with Jason Morris. Patient presenting from PCP office with continued chest pain for past 5 days. Patient does have significant family history for CAD. PCP note had recommended lab testing, CT scan to rule out PE and admission for further chest pain rule out. Patient currently awaiting a CT scan. Initial troponin negative. Abnormal EKG without any acute changes.  CT results are back and I spoke with the radiologist. No signs for PE however patient has very significant CAD throughout. No signs of complete blockage. We'll plan for hospital admission for continued chest pain and evaluation.  Spoke with Jason Morris with [facility] hospitalist.  He request I consult cardiology for admission.  Spoke Jason Morris with cardiology.  He will see pt and admit under Jason Morris to tele bed.     Jason Seller, PA-C 08/08/12 2246

## 2012-08-08 NOTE — ED Notes (Signed)
Spoke to mini lab regarding status of troponin. Per mini lab. Original troponin was collected and sent to main lab but not processed. Mini lab will recollect specimen d/t collection times.

## 2012-08-09 ENCOUNTER — Encounter (HOSPITAL_COMMUNITY): Payer: Self-pay | Admitting: General Practice

## 2012-08-09 ENCOUNTER — Other Ambulatory Visit: Payer: Self-pay | Admitting: Physician Assistant

## 2012-08-09 DIAGNOSIS — I2510 Atherosclerotic heart disease of native coronary artery without angina pectoris: Secondary | ICD-10-CM

## 2012-08-09 DIAGNOSIS — R079 Chest pain, unspecified: Secondary | ICD-10-CM

## 2012-08-09 DIAGNOSIS — I519 Heart disease, unspecified: Secondary | ICD-10-CM

## 2012-08-09 LAB — LIPID PANEL
Cholesterol: 222 mg/dL — ABNORMAL HIGH (ref 0–200)
Total CHOL/HDL Ratio: 9.3 RATIO
Triglycerides: 172 mg/dL — ABNORMAL HIGH (ref <150)
VLDL: 34 mg/dL (ref 0–40)

## 2012-08-09 LAB — HEMOGLOBIN A1C: Mean Plasma Glucose: 123 mg/dL — ABNORMAL HIGH (ref <117)

## 2012-08-09 MED ORDER — ATORVASTATIN CALCIUM 80 MG PO TABS: 80.0000 mg | ORAL_TABLET | Freq: Every day | ORAL | Status: DC

## 2012-08-09 MED ORDER — ASPIRIN 81 MG PO CHEW: 81.0000 mg | CHEWABLE_TABLET | Freq: Every day | ORAL | Status: DC

## 2012-08-09 MED ORDER — ASPIRIN 81 MG PO CHEW
81.0000 mg | CHEWABLE_TABLET | Freq: Every day | ORAL | Status: DC
  Administered 2012-08-09: 81 mg via ORAL
  Filled 2012-08-09: qty 1

## 2012-08-09 MED ORDER — ACETAMINOPHEN 325 MG PO TABS: 650.0000 mg | ORAL_TABLET | Freq: Four times a day (QID) | ORAL | Status: DC | PRN

## 2012-08-09 MED ORDER — ONDANSETRON HCL 4 MG/2ML IJ SOLN: 4.0000 mg | Freq: Four times a day (QID) | INTRAMUSCULAR | Status: DC | PRN

## 2012-08-09 MED ORDER — PNEUMOCOCCAL VAC POLYVALENT 25 MCG/0.5ML IJ INJ
0.5000 mL | INJECTION | Freq: Once | INTRAMUSCULAR | Status: AC
  Administered 2012-08-09: 0.5 mL via INTRAMUSCULAR
  Filled 2012-08-09: qty 0.5

## 2012-08-09 MED ORDER — ATORVASTATIN CALCIUM 80 MG PO TABS
80.0000 mg | ORAL_TABLET | Freq: Every day | ORAL | Status: DC
  Filled 2012-08-09: qty 1

## 2012-08-09 MED ORDER — NICOTINE 21 MG/24HR TD PT24: 1.0000 | MEDICATED_PATCH | TRANSDERMAL | Status: DC

## 2012-08-09 MED ORDER — PANTOPRAZOLE SODIUM 40 MG PO TBEC
40.0000 mg | DELAYED_RELEASE_TABLET | Freq: Two times a day (BID) | ORAL | Status: DC
  Administered 2012-08-09 (×2): 40 mg via ORAL
  Filled 2012-08-09 (×2): qty 1

## 2012-08-09 MED ORDER — GI COCKTAIL ~~LOC~~
30.0000 mL | Freq: Four times a day (QID) | ORAL | Status: DC | PRN
  Filled 2012-08-09: qty 30

## 2012-08-09 MED ORDER — KETOROLAC TROMETHAMINE 30 MG/ML IJ SOLN
30.0000 mg | Freq: Once | INTRAMUSCULAR | Status: DC
  Filled 2012-08-09: qty 1

## 2012-08-09 MED ORDER — TRAMADOL HCL 50 MG PO TABS: 50.0000 mg | ORAL_TABLET | Freq: Four times a day (QID) | ORAL | Status: DC | PRN

## 2012-08-09 MED ORDER — KETOROLAC TROMETHAMINE 30 MG/ML IJ SOLN
30.0000 mg | Freq: Four times a day (QID) | INTRAMUSCULAR | Status: DC | PRN
  Filled 2012-08-09: qty 1

## 2012-08-09 MED ORDER — ACETAMINOPHEN 325 MG PO TABS: 650.0000 mg | ORAL_TABLET | ORAL | Status: DC | PRN

## 2012-08-09 MED ORDER — LACTATED RINGERS IV SOLN
INTRAVENOUS | Status: DC
  Administered 2012-08-09 (×2): via INTRAVENOUS

## 2012-08-09 MED ORDER — NITROGLYCERIN 0.4 MG SL SUBL: 0.4000 mg | SUBLINGUAL_TABLET | SUBLINGUAL | Status: DC | PRN

## 2012-08-09 NOTE — ED Provider Notes (Signed)
Medical screening examination/treatment/procedure(s) were performed by non-physician practitioner and as supervising physician I was immediately available for consultation/collaboration.  Geoffery Lyons, MD 08/09/12 [phone]

## 2012-08-09 NOTE — ED Provider Notes (Signed)
I saw and evaluated the patient, reviewed the resident's note and I agree with the findings and plan.   .Face to face Exam:  General:  Awake HEENT:  Atraumatic Resp:  Normal effort Abd:  Nondistended Neuro:No focal weakness  Nelia Shi, MD 08/09/12 1120

## 2012-08-09 NOTE — Progress Notes (Signed)
  Echocardiogram 2D Echocardiogram has been performed.  Georgian Co 08/09/2012, 12:29 PM

## 2012-08-09 NOTE — Discharge Summary (Signed)
Discharge Summary   Patient ID: Jason Morris, MRN: [id number], DOB/AGE: [birth year] 37 y.o.  Admit date: 08/08/2012 Discharge date: 08/09/2012   Primary Care Physician:  No PCP Per Patient   Primary Cardiologist:  Dr. Olga Millers    Reason for Admission:  Chest Pain   Primary Discharge Diagnoses:  1. Chest Pain without objective evidence of ischemia 2. CAD - coronary Ca noted on Chest CTA 3. Hypertension 4. Anxiety  5. Tobacco Abuse 6. FHx of CAD   Wt Readings from Last 3 Encounters:  08/09/12 264 lb 12.4 oz (120.1 kg)  08/08/12 265 lb 9.6 oz (120.475 kg)  07/18/12 278 lb (126.1 kg)    Secondary Discharge Diagnoses:   Past Medical History  Diagnosis Date  . Hypertension   . Anxiety   . Chest pain     a. admx 8/14 - CEs neg but coronary Ca on chest CTA  . GERD (gastroesophageal reflux disease)   . Migraines     "get them if I lay on my left side or am active outside; pretty much qd til the last 5 days" (08/09/2012)  . Chronic lower back pain   . Depression   . CAD (coronary artery disease)     a. Chest CTA 8/14 with coronary Ca and RCA aneurysmal dilatation  . HLD (hyperlipidemia)       Allergies:    Allergies  Allergen Reactions  . Lopressor (Metoprolol Tartrate) Shortness Of Breath    Felt like he was going to die , could not breath & syncope      Procedures Performed This Admission:    None   Hospital Course:  Jason Morris is a 37 y.o. male with a history of anxiety, tobacco abuse and family history of CAD as well as a recent diagnosis of HTN who presented to the emergency room with complaints of chest and back pain. Symptoms were quite atypical and are extensively outlined in the H&P. Patient noted symptoms started in his thoracic area and were worse with neck movement and relieved with lying flat. He also notes a numbness in his elbow.  Chest CTA in the emergency room demonstrated no evidence of pulmonary embolism. However, there was diffuse  calcification noted in the RCA and LAD as well as RCA aneurysmal dilatation.  Patient was observed overnight. Cardiac markers remained normal. EKG was without acute change. Patient was evaluated on the morning of discharge but Crenshaw. He was felt stable for discharge to home. He will need outpatient coronary CTA for further evaluation. He was started on statin therapy and will need follow up in 6 weeks. He was counseled on tobacco cessation.   Discharge Vitals:   Blood pressure 139/81, pulse 73, temperature 97.8 F (36.6 C), temperature source Oral, resp. rate 18, height 5' 11.5" (1.816 m), weight 264 lb 12.4 oz (120.1 kg), SpO2 99.00%.   Labs:   Recent Labs  08/08/12 1455  WBC 9.2  HGB 15.1  HCT 42.5  MCV 86.6  PLT 213     Recent Labs  08/08/12 1455  NA 135  K 4.2  CL 99  CO2 27  BUN 11  CREATININE 1.02  CALCIUM 9.9     Recent Labs  08/08/12 2225 08/09/12 0129 08/09/12 1100  TROPONINI <0.30 <0.30 <0.30    Lab Results  Component Value Date   CHOL 222* 08/09/2012   HDL 24* 08/09/2012   LDLCALC 164* 08/09/2012   TRIG 172* 08/09/2012    Diagnostic Procedures and Studies:  Ct Angio Chest W/cm &/or Wo Cm  08/08/2012      IMPRESSION:  1.  Negative for pulmonary embolism. 2. Advanced coronary artery atherosclerosis and diffuse aneurysmal enlargement of the RCA (up to a centimeter diameter).  Findings suggest previous arteritis, such as Kawasaki's disease.  Gated cardiac CT recommended.  3. Bronchitic changes with air trapping.   Original Report Authenticated By: Tiburcio Pea   Dg Chest Port 1 View  08/08/2012    IMPRESSION: No active cardiopulmonary disease.   Original Report Authenticated By: Judie Petit. Miles Costain, M.D.    Disposition:   Pt is being discharged home today in good condition.  Follow-up Plans & Appointments      Follow-up Information   Follow up with No PCP Per Patient.   Contact information:   [address] [place] [address] [phone]       Follow  up with [facility] [address] (office will call to arrange a cardiac CT scan to look at your arteries in your heart)    Contact information:   [address] [place] [address] [phone]      Follow up with Olga Millers, MD In 6 weeks. (office will call to arrange followup with Dr. Jens Som)    Contact information:   [address] [place], [address]                         [address] [place] [address] [phone]       Discharge Medications    Medication List         aspirin 81 MG chewable tablet  Chew 1 tablet (81 mg total) by mouth daily.     atorvastatin 80 MG tablet  Commonly known as:  LIPITOR  Take 1 tablet (80 mg total) by mouth daily at 6 PM.     lisinopril-hydrochlorothiazide 20-12.5 MG per tablet  Commonly known as:  PRINZIDE,ZESTORETIC  Take 0.5 tablets by mouth 2 (two) times daily.     LORazepam 0.5 MG tablet  Commonly known as:  ATIVAN  Take 1 tablet (0.5 mg total) by mouth every 8 (eight) hours as needed for anxiety.     nicotine 21 mg/24hr patch  Commonly known as:  NICODERM CQ - dosed in mg/24 hours  Place 1 patch onto the skin daily.     nitroGLYCERIN 0.4 MG SL tablet  Commonly known as:  NITROSTAT  Place 1 tablet (0.4 mg total) under the tongue every 5 (five) minutes as needed for chest pain.     omeprazole 20 MG capsule  Commonly known as:  PRILOSEC  Take 1 capsule (20 mg total) by mouth daily.     sertraline 50 MG tablet  Commonly known as:  ZOLOFT  Take 12.5 mg by mouth daily.         Outstanding Labs/Studies  1. Coronary CTA Check Lipids and LFTs in 6 weeks.    Duration of Discharge Encounter: Greater than 30 minutes including physician and PA time.  Signed, Tereso Newcomer, PA-C   08/09/2012 2:50 PM    [address] [place], [place] [address] [phone]

## 2012-08-09 NOTE — Discharge Summary (Signed)
See progress notes Jason Morris  

## 2012-08-09 NOTE — Progress Notes (Signed)
Nutrition Brief Note  Patient identified on the Malnutrition Screening Tool (MST) Report  Body mass index is 36.42 kg/(m^2). Patient meets criteria for class II obesity based on current BMI.   Current diet order is heart healthy, patient is consuming approximately 75% of meals at this time. Labs and medications reviewed. Met with pt who reports following DASH diet at home and eating 2-3 meals/day. Pt reports 40 pound weight loss in the past lunch from eating healthier, avoiding truck stop food, and cutting back on smoking. Pt eating excellent during admission. Denies having any diet educational needs.   No nutrition interventions warranted at this time. If nutrition issues arise, please consult RD.   Levon Hedger MS, RD, LDN [phone] Weekend/After Hours Pager

## 2012-08-09 NOTE — H&P (Signed)
PCP:   No PCP Per Patient   Chief Complaint: Chest pain, back pain   HPI: This is a 37 year old Caucasian male with the past medical history of recently diagnosed hypertension and tobacco abuse as well as extensive family history of early cardiovascular disease who presented to the emergency room with the complaints of chest pain and back pain. Apparently patient was going to what he told "followup Dr." Is a followup for his recently diagnosed hypertension. That's was diagnosed about a month ago when he presented to the emergency department with blood pressure 200/100 mmHg, as well as symptoms that were perceived to be a panic attack. Given 4 patient reported chest pain and back pain and was sent to the emergency room for further evaluation. Per patient about 5 days prior to presentation she started having back pain in the thoracic region, this pain was 5/10 however it was exacerbated to 10 out of 10 in severity if he banded he is back. Pain was associated with numbness that was going down to his elbow. Pain was also worse with moving his neck. 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 Laying flat relieved the pain. Also started having pain in his substernal area also around 5 days ago. He felt that his chest pain was worth after her food intake and got better after drinking water. However GI cocktail given in the emergency department didn't quite relieved the chest pain. Interestingly patient reported that the chest pain was worse with rubbing his chest. As well as needed palpating his left parasternal area. Patient gave somewhat confusing history in terms of his current exercise tolerance, she reported that on one hand he can walk briskly and none of those chest or back pains that described above are bothering him. However he also mentioned that he is more out of shape and gets winded when he walks fast.   In the emergency department patient underwent CTA to rule out pulmonary  embolism which didn't show any active pulmonary embolism or lung abnormalities, however it showed significant coronary artery calcification for 37 year old male as well as what looked like aneurysmal dilation of the right coronary artery. The study was not gated for coronary artery assessment.  Giving multiple complaints and confusing history it was felt that patient would benefit from overnight observation with possible CTA of his coronary arteries in the morning after rehydration versus possible left heart catheterization as an outpatient.  Review of Systems:  The patient denies anorexia, fever, weight loss,, vision loss, decreased hearing, hoarseness, chest pain, syncope, peripheral edema, balance deficits, hemoptysis, abdominal pain, melena, hematochezia, hematuria, incontinence, genital sores, muscle weakness, suspicious skin lesions, transient blindness, difficulty walking, depression, unusual weight change, abnormal bleeding, enlarged lymph nodes, angioedema, and breast masses.  Past Medical History: Past Medical History  Diagnosis Date  . Hypertension   . Anxiety    Medications: Prior to Admission medications   Medication Sig Start Date End Date Taking? Authorizing Provider  lisinopril-hydrochlorothiazide (PRINZIDE,ZESTORETIC) 20-12.5 MG per tablet Take 0.5 tablets by mouth 2 (two) times daily. 07/18/12  Yes Cl[place]ford Cyndie Mull, MD  LORazepam (ATIVAN) 0.5 MG tablet Take 1 tablet (0.5 mg total) by mouth every 8 (eight) hours as needed for anxiety. 07/18/12  Yes Clanford Cyndie Mull, MD  omeprazole (PRILOSEC) 20 MG capsule Take 1 capsule (20 mg total) by mouth daily. 07/18/12  Yes Clanford Cyndie Mull, MD  sertraline (ZOLOFT) 50 MG tablet Take 12.5 mg by mouth daily. 07/18/12  Yes Clanford  Cyndie Mull, MD  LORazepam (ATIVAN) 1 MG tablet Take 1 tablet (1 mg total) by mouth 2 (two) times daily. 08/08/12   Kennis Carina, MD    Allergies:   Allergies  Allergen Reactions  . Lopressor (Metoprolol  Tartrate) Shortness Of Breath    Felt like he was going to die , could not breath & syncope    Social History:  reports that he has been smoking Cigarettes.  He has been smoking about 3.00 packs per day. He does not have any smokeless tobacco history on file. He reports that he drinks about 0.6 ounces of alcohol per week. Denied drug abuse. 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 Per patient's report Physical Exam: Filed Vitals:   08/08/12 2245 08/08/12 2300 08/08/12 2315 08/09/12 0000  BP: 120/61 110/66 121/71 133/84  Pulse: 74 76 68 79  Temp:    98.3 F (36.8 C)  TempSrc:    Oral  Resp: 16 12 14 20   Weight:    120.1 kg (264 lb 12.4 oz)  SpO2: 100% 97% 97% 100%   General:  Well appearing. No respiratory difficulty HEENT: normal Neck: supple. no JVD. Carotids 2+ bilat; no bruits. No lymphadenopathy or thryomegaly appreciated. Cor: PMI nondisplaced. Regular rate & rhythm. No rubs, gallops or murmurs. Lungs: clear Abdomen: soft, nontender, nondistended. No hepatosplenomegaly. No bruits or masses. Good bowel sounds. Extremities: no cyanosis, clubbing, rash, edema Neuro: alert & oriented x 3, cranial nerves grossly intact. moves all 4 extremities w/o difficulty. Affect pleasant.  Labs on Admission:   Recent Labs  08/08/12 1455  NA 135  K 4.2  CL 99  CO2 27  GLUCOSE 94  BUN 11  CREATININE 1.02  CALCIUM 9.9   Recent Labs  08/08/12 1455  WBC 9.2  HGB 15.1  HCT 42.5  MCV 86.6  PLT 213    Recent Labs  08/08/12 2225  TROPONINI <0.30    Radiological Exams on Admission: Dg Chest 2 View  07/25/2012   *RADIOLOGY REPORT*  Clinical Data: Chest pain  CHEST - 2 VIEW  Comparison: 07/10/2012  Findings: Low lung volumes.  No focal consolidation.  No pleural effusion or pneumothorax.  The heart is normal in size.  Mild degenerative changes of the visualized thoracolumbar spine.   IMPRESSION: No evidence of acute cardiopulmonary disease.   Original Report Authenticated By: Charline Bills, M.D.   Dg Chest 2 View  07/10/2012   *RADIOLOGY REPORT*  Clinical Data: Chest pain.  Left arm pain.  Numbness.  Headache.  CHEST - 2 VIEW  Comparison: None.  Findings: Basilar atelectasis.  No airspace disease.  No effusion. There is no focal consolidation.  Trachea midline.  Mediastinal contours are within normal limits. Monitoring leads are projected over the chest.  Monitoring buttons projects over the chest as well.  IMPRESSION: No acute cardiopulmonary disease.   Original Report Authenticated By: Andreas Newport, M.D.   Ct Head Wo Contrast  07/25/2012   *RADIOLOGY REPORT*  Clinical Data: Headache, weakness, anxious  CT HEAD WITHOUT CONTRAST  Technique:  Contiguous axial images were obtained from the base of the skull through the vertex without contrast.  Comparison: None.  Findings: No evidence of parenchymal hemorrhage or extra-axial fluid collection. No mass lesion, mass effect, or midline shift.  No CT evidence of acute infarction.  Cerebral volume is age appropriate.  No ventriculomegaly.  The visualized paranasal  sinuses are essentially clear. The mastoid air cells are unopacified.  No evidence of calvarial fracture.  IMPRESSION: Normal head CT.   Original Report Authenticated By: Charline Bills, M.D.   Ct Angio Chest W/cm &/or Wo Cm  08/08/2012   *RADIOLOGY REPORT*  Clinical Data: Chest pain for 5 days.  CT ANGIOGRAPHY CHEST  Technique:  Multidetector CT imaging of the chest using the standard protocol during bolus administration of intravenous contrast. Multiplanar reconstructed images including MIPs were obtained and reviewed to evaluate the vascular anatomy.  Contrast: OMNIPAQUE IOHEXOL 350 MG/ML SOLN  Comparison: None.  Findings:  THORACIC INLET/BODY WALL:  Minimal calcification in the lower right thyroid gland, considered incidental.  MEDIASTINUM:  Heart size is normal.  No  pericardial effusion.  Unexpected coronary arterial calcification, present throughout the RCA and the proximal LAD.  Although limited by motion, the RCA appears enlarged, particularly in the lower atrioventricular groove, measuring up to centimeter.  No pulmonary arterial filling defect identified.  No adenopathy.  LUNG WINDOWS:  Patchy ground-glass attenuation with notable lucency in the lateral segment right middle lobe, likely air trapping.  Mild scattered atelectasis and diffuse bronchial wall thick  UPPER ABDOMEN:  No acute findings.  OSSEOUS:  No acute fracture.  No suspicious lytic or blastic lesions.  These results were called by telephone on 08/08/2012 at 21:45 to [facility], who verbally acknowledged these results.  IMPRESSION:  1.  Negative for pulmonary embolism. 2. Advanced coronary artery atherosclerosis and diffuse aneurysmal enlargement of the RCA (up to a centimeter diameter).  Findings suggest previous arteritis, such as Kawasaki's disease.  Gated cardiac CT recommended.  3. Bronchitic changes with air trapping.   Original Report Authenticated By: Tiburcio Pea   Dg Chest Port 1 View  08/08/2012   *RADIOLOGY REPORT*  Clinical Data: Chest pain  CHEST - 2 VIEW  Comparison:  07/25/2012  Findings:  The heart size and mediastinal contours are within normal limits.  Both lungs are clear.  The visualized skeletal structures are unremarkable.  IMPRESSION: No active cardiopulmonary disease.   Original Report Authenticated By: Judie Petit. Shick, M.D.    EKG sinus rhythm, 80 beats per minute, normal QT and PR intervals, normal axis,    Assessment/Plan  this is a 37 year old Caucasian male who presented with multiple complaints including chest pain and back pain. He was accidentally found significant coronary artery calcification as well as RCA aneurysmal dilation on the CTA performed to rule out pulmonary embolism. Given conflicting story the patient was providing as well as clearly abnormal coronary  arteries 37 year old gentleman he was brought in for observation   Problem list: 1. coronary artery aneurysm and calcifications 2. chest pain:  3. back pain  Problem list and plan: 1. coronary artery aneurysm and calcifications Those are unlikely culprit for patient's chest pain however given his extensive family history this needs to be addressed with either coronary CT angiography or invasive left heart cath and coronary angiography. At this point of time the patient doesn't appear to be interested in invasive coronary evaluation.  We'll plan to rehydrate him and readdress the question in the morning with either unit CTA of the coronary arteries at best physical over the weekend were discharging him if his troponins are negative and bring him back for possible left heart catheterization versus CTA - aspirin 81 mg daily and Lipitor 80 mg daily - We'll test hemoglobin A1c, lipid profile and TSH  2. chest pain:  Will cycle troponins Since unlikely secondary to  myocardial ischemia, most likely musculoskeletal versus GERD We'll obtain echocardiogram given patient's history of hypertension Will provide PPI, GI cocktail, Toradol, Tylenol and tramadol for pain  3. back pain IV Toradol, Tylenol, tramadol as needed   Cuma Polyakov 08/09/2012, 12:28 AM

## 2012-08-09 NOTE — Progress Notes (Signed)
   Subjective:  Denies CP or dyspnea   Objective:  Filed Vitals:   08/08/12 2315 08/09/12 0000 08/09/12 0449 08/09/12 0914  BP: 121/71 133/84 108/56   Pulse: 68 79 65   Temp:  98.3 F (36.8 C) 97.9 F (36.6 C)   TempSrc:  Oral Oral   Resp: 14 20 18    Height:    5' 11.5" (1.816 m)  Weight:  264 lb 12.4 oz (120.1 kg)    SpO2: 97% 100% 100%     Intake/Output from previous day: No intake or output data in the 24 hours ending 08/09/12 1024  Physical Exam: Physical exam: Well-developed well-nourished in no acute distress.  Skin is warm and dry.  HEENT is normal.  Neck is supple.  Chest is clear to auscultation with normal expansion.  Cardiovascular exam is regular rate and rhythm.  Abdominal exam nontender or distended. No masses palpated. Extremities show no edema. neuro grossly intact    Lab Results: Basic Metabolic Panel:  Recent Labs  16/10/96 1455  NA 135  K 4.2  CL 99  CO2 27  GLUCOSE 94  BUN 11  CREATININE 1.02  CALCIUM 9.9   CBC:  Recent Labs  08/08/12 1455  WBC 9.2  HGB 15.1  HCT 42.5  MCV 86.6  PLT 213   Cardiac Enzymes:  Recent Labs  08/08/12 2225 08/09/12 0129  TROPONINI <0.30 <0.30     Assessment/Plan:  1 chest pain-symptoms extremely atypical. Initial electrocardiogram with no ST changes. Enzymes negative. Coronary calcium noted on CT scan with aneurysmal dilatation of RCA. Plan outpatient cardiac CT to further assess. 2 coronary calcium noted on CT-continue aspirin. Continue Lipitor. Check lipids and liver in 6 weeks. 3 hyperlipidemia-continue statin. 4 tobacco abuse-patient counseled on discontinuing. Plan discharge and followup enzymes negative with outpatient CT scan as described above and followup with me in 6 weeks. Jason Morris 08/09/2012, 10:24 AM

## 2012-08-13 ENCOUNTER — Ambulatory Visit (HOSPITAL_COMMUNITY)
Admission: RE | Admit: 2012-08-13 | Discharge: 2012-08-13 | Disposition: A | Discharge status: Home / Self Care | Payer: Self-pay | Source: Ambulatory Visit | Attending: Physician Assistant | Admitting: Physician Assistant

## 2012-08-13 ENCOUNTER — Telehealth: Payer: Self-pay | Admitting: Physician Assistant

## 2012-08-13 ENCOUNTER — Encounter (HOSPITAL_COMMUNITY): Payer: Self-pay

## 2012-08-13 DIAGNOSIS — R079 Chest pain, unspecified: Secondary | ICD-10-CM | POA: Insufficient documentation

## 2012-08-13 DIAGNOSIS — I2510 Atherosclerotic heart disease of native coronary artery without angina pectoris: Secondary | ICD-10-CM

## 2012-08-13 DIAGNOSIS — I6529 Occlusion and stenosis of unspecified carotid artery: Secondary | ICD-10-CM | POA: Insufficient documentation

## 2012-08-13 MED ORDER — NITROGLYCERIN 0.4 MG SL SUBL
0.4000 mg | SUBLINGUAL_TABLET | SUBLINGUAL | Status: DC | PRN
  Administered 2012-08-13: 0.4 mg via SUBLINGUAL
  Filled 2012-08-13: qty 25

## 2012-08-13 MED ORDER — METOPROLOL TARTRATE 1 MG/ML IV SOLN
INTRAVENOUS | Status: AC
  Filled 2012-08-13: qty 15

## 2012-08-13 MED ORDER — IOHEXOL 350 MG/ML SOLN
100.0000 mL | Freq: Once | INTRAVENOUS | Status: AC | PRN
  Administered 2012-08-13: 100 mL via INTRAVENOUS

## 2012-08-13 MED ORDER — METOPROLOL TARTRATE 1 MG/ML IV SOLN
5.0000 mg | INTRAVENOUS | Status: DC | PRN
  Administered 2012-08-13: 2.5 mg via INTRAVENOUS
  Administered 2012-08-13 (×2): 5 mg via INTRAVENOUS
  Administered 2012-08-13: 2.5 mg via INTRAVENOUS
  Filled 2012-08-13: qty 5

## 2012-08-13 MED ORDER — NITROGLYCERIN 0.4 MG SL SUBL
SUBLINGUAL_TABLET | SUBLINGUAL | Status: AC
  Filled 2012-08-13: qty 25

## 2012-08-13 NOTE — Telephone Encounter (Signed)
He will be seeing Dr. Jens Som it appears in followup.  He has an aneurysmal RCA with proximal moderate stenosis, up to 70% by CT.

## 2012-08-13 NOTE — Telephone Encounter (Signed)
Pt called after-hours for cardiac clearance based on his cardiac CT results - wanted to know if he could go back to work tonight. This has not been read yet. 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 I do not feel comfortable clearing him yet for work without having this result especially from DOT standpoint. Pt otherwise feels well. Advised him to contact office in AM to see if they could expedite result being called to him. Will forward to Nishan/[place]. Kinjal Neitzke PA-C

## 2012-08-14 NOTE — Telephone Encounter (Signed)
Ok for work. Make sure he has fuov Olga Millers

## 2012-08-14 NOTE — Telephone Encounter (Signed)
Spoke with pt, Aware of dr crenshaw's recommendations.  Follow up scheduled  

## 2012-08-16 ENCOUNTER — Telehealth: Payer: Self-pay | Admitting: Physician Assistant

## 2012-08-16 NOTE — Telephone Encounter (Signed)
Pt called over the weekend with diffuse itchy rash that developed overnight. He spent yesterday outdoors and went swimming. He also felt bad after eating chinese food last night. Had CT angio on 8/6 but d/w Dr. Johney Frame - we feel he is quite a few days out for the contrast to be the culprit. Has some head pressure but no headache, confusion, fever, throat tickling/swelling, lip swelling, SOB or difficulty breathing. Otherwise feels fine. Gave pt 2 options: 1) go to urgent care for eval in person to be on safe side or 2) more conservatively, treat with benadryl 25-50mg  q6hr prn rash, pepcid 40mg  daily until rash resolves, and hold lisinopril/HCTZ in case this is culprit. He elected #2. We discussed warning signs for him to call 911/medical attention and I also advised him to proceed to urgent care in AM if rash is not improved to eval for other causes in case this has to do with outdoor exposure rather than allergic reaction. I advised him to follow BP at home and call if greater than 130/80 given discontinuation of lisinopril/HCTZ. I also left message on scheduling voicemail for pt to seen in office by PA sooner than 09/10/12 appt in case med changes need to be discussed in person. The patient verbalized understanding and gratitude. Walburga Hudman PA-C

## 2012-08-17 ENCOUNTER — Emergency Department (HOSPITAL_COMMUNITY)
Admission: EM | Admit: 2012-08-17 | Discharge: 2012-08-17 | Disposition: A | Discharge status: Home / Self Care | Payer: Self-pay | Source: Home / Self Care | Attending: Emergency Medicine | Admitting: Emergency Medicine

## 2012-08-17 ENCOUNTER — Encounter (HOSPITAL_COMMUNITY): Payer: Self-pay | Admitting: *Deleted

## 2012-08-17 ENCOUNTER — Telehealth: Payer: Self-pay | Admitting: Physician Assistant

## 2012-08-17 DIAGNOSIS — L508 Other urticaria: Secondary | ICD-10-CM

## 2012-08-17 MED ORDER — PREDNISONE 20 MG PO TABS: ORAL_TABLET | ORAL | Status: DC

## 2012-08-17 MED ORDER — HYDROXYZINE HCL 10 MG PO TABS: 10.0000 mg | ORAL_TABLET | Freq: Three times a day (TID) | ORAL | Status: DC | PRN

## 2012-08-17 NOTE — ED Provider Notes (Signed)
CSN: [id number]     Arrival date & time 08/17/12  1423 History     None    Chief Complaint  Patient presents with  . Urticaria    HPI  Pt is a 37 yo M presenting with 2 day history of rash. Patient states that prior to 2 days ago he had no rash or concerns. He has been exposed to many new medications recently. Started Lipitor one week ago and rash started 3 days after that. Stopped taking Lipitor and Lisinopril yesterday. Also had CT scan with contrast last week. [place], no new sunscreens or lotions. No new food exposures Started on bottom of feet with the itching (no rash there) then woke up the next morning with diffuse rash on arms, neck, back and chest.  Never had hives before. No lip swelling, no problems breathing, no throat closing up. Tried Benadryl which helps some.    Past Medical History  Diagnosis Date  . Hypertension   . Anxiety   . Chest pain     a. admx 8/14 - CEs neg but coronary Ca on chest CTA  . GERD (gastroesophageal reflux disease)   . Migraines     "get them if I lay on my left side or am active outside; pretty much qd til the last 5 days" (08/09/2012)  . Chronic lower back pain   . Depression   . HLD (hyperlipidemia)   . CAD (coronary artery disease)     advanced; a. Chest CTA 8/14 with coronary Ca and RCA aneurysmal dilatation   Past Surgical History  Procedure Laterality Date  . Myringotomy with tube placement Bilateral     "4-5 times as a child" (08/09/2012)   No family history on file. History  Substance Use Topics  . Smoking status: Heavy Tobacco Smoker -- 0.50 packs/day for 17 years    Types: Cigarettes  . Smokeless tobacco: Never Used     Comment: 08/09/2012 "smoked 3 ppd til 1 month ago"   . Alcohol Use: 0.0 oz/week     Comment: 08/09/2012 "had a mixed drink once in the past year"    Review of Systems  Constitutional: Negative for fever, activity change and fatigue.  HENT: Negative for facial swelling.   Eyes: Negative for visual  disturbance.  Respiratory: Negative for cough, chest tightness and shortness of breath.   Cardiovascular: Negative for chest pain.  Gastrointestinal: Negative for abdominal pain.  Musculoskeletal: Positive for back pain. Negative for joint swelling and arthralgias.  Skin: Positive for rash.  Neurological: Negative for headaches.  All other systems reviewed and are negative.    Allergies  Lopressor  Home Medications   Current Outpatient Rx  Name  Route  Sig  Dispense  Refill  . aspirin 81 MG chewable tablet   Oral   Chew 1 tablet (81 mg total) by mouth daily.         Marland Kitchen lisinopril-hydrochlorothiazide (PRINZIDE,ZESTORETIC) 20-12.5 MG per tablet   Oral   Take 0.5 tablets by mouth 2 (two) times daily.         . hydrOXYzine (ATARAX/VISTARIL) 10 MG tablet   Oral   Take 1 tablet (10 mg total) by mouth 3 (three) times daily as needed for itching.   30 tablet   0   . LORazepam (ATIVAN) 0.5 MG tablet   Oral   Take 1 tablet (0.5 mg total) by mouth every 8 (eight) hours as needed for anxiety.   20 tablet   0   .  nicotine (NICODERM CQ - DOSED IN MG/24 HOURS) 21 mg/24hr patch   Transdermal   Place 1 patch onto the skin daily.   28 patch   0   . nitroGLYCERIN (NITROSTAT) 0.4 MG SL tablet   Sublingual   Place 1 tablet (0.4 mg total) under the tongue every 5 (five) minutes as needed for chest pain.   25 tablet   11   . omeprazole (PRILOSEC) 20 MG capsule   Oral   Take 1 capsule (20 mg total) by mouth daily.   30 capsule   3   . predniSONE (DELTASONE) 20 MG tablet      Take 3 tabs for 2 days, 2 tabs for 2 days, 1 tab for 2 days, then 1/2 tab for 4 days   14 tablet   0   . sertraline (ZOLOFT) 50 MG tablet   Oral   Take 12.5 mg by mouth daily.          BP 152/98  Pulse 112  Temp(Src) 98 F (36.7 C) (Oral)  Resp 20  SpO2 100% Physical Exam  Constitutional: He is oriented to person, place, and time. He appears well-developed and well-nourished. No distress.   HENT:  Head: Normocephalic and atraumatic.  Eyes: Pupils are equal, round, and reactive to light.  Neck: Normal range of motion.  Cardiovascular: Regular rhythm and normal heart sounds.   Tachycardic  Pulmonary/Chest: Effort normal and breath sounds normal.  Abdominal: Soft. There is no tenderness.  Musculoskeletal: Normal range of motion.  Neurological: He is alert and oriented to person, place, and time.  Skin: Skin is dry. Rash noted.  Diffuse erythematous raised macules of various sizes on arms, chest, neck and back. Very few on face. No rash on palms or soles. Patient is constantly scratching skin. No open or oozing lesions  Psychiatric: He has a normal mood and affect.    ED Course   Procedures (including critical care time)  Labs Reviewed - No data to display No results found. 1. Urticaria, acute     MDM  Patient with diffuse acute urticaria without signs of angioedema. Will treat as outpatient with steroid taper (Prednisone 60mg  x2d, 40mg  x2, 20mg  x2 then 10mg  x4days for total of 10 day) as well as Atarax prn itching. Given reasons to return to [facility] or ED including difficulty breathing, lip swelling, tongue swelling or problems swallowing.  Patient d/c in stable condition.  Hilarie Fredrickson, MD 08/17/12 [phone]

## 2012-08-17 NOTE — Telephone Encounter (Signed)
Pt called answering service back again this afternoon. Rash got worse last night. He's not sure if benadryl is helping. No SOB, swelling or chest tightness. I don't think there is much else I can offer by way of evaluation over the phone. I reiterated my advice given yesterday which was if rash does not improve, he should proceed to urgent care for further evaluation. He verbalized understanding and gratitude. Renatta Shrieves PA-C

## 2012-08-17 NOTE — ED Provider Notes (Signed)
Medical screening examination/treatment/procedure(s) were performed by non-physician practitioner and as supervising physician I was immediately available for consultation/collaboration.  Raynald Blend, MD 08/17/12 [phone]

## 2012-08-17 NOTE — ED Notes (Signed)
Took 50mg  Benadryl PO @ 0800.

## 2012-08-17 NOTE — Telephone Encounter (Addendum)
Pt called after hours again on Sunday 08/17/12. See prior 2 phone notes from this weekend. Pt went to urgent care at my advice. BP was up at 152/98 but usually runs 140/80, HR 112 but he feels jittery. Urgent care felt he was stable to go home but they wonder if lipitor is the cause. He will need an appointment to discuss these issues in person as this rash has been difficult to manage over the phone after-hours. He is allergic to metoprolol as well. I called office yesterday to leave message see if we could move appt up. I advised pt to avoid lipitor for tonight, continue to hold lisinopril and follow BP, and to call office tomorrow during office hours to further discuss plan for f/u. He verbalized understanding and gratitude.  Dayna Dunn PA-C

## 2012-08-17 NOTE — ED Notes (Signed)
Reports starting with pruritis to soles of feet on Friday, which progressed into urticaria.  Has taken Benadryl at home without significant relief.  Reports having had a CT scan 4 days ago for chest/back pain; had started Lipitor 1 wk ago and lisinopril-HCTZ 1 month ago.  Stopped taking both meds yesterday.

## 2012-08-18 ENCOUNTER — Telehealth: Payer: Self-pay | Admitting: Cardiology

## 2012-08-18 MED ORDER — AMLODIPINE BESYLATE 5 MG PO TABS: 5.0000 mg | ORAL_TABLET | Freq: Every day | ORAL | Status: DC

## 2012-08-18 NOTE — Telephone Encounter (Signed)
New Prob     Pt states he has broken out in hives lately, yet he has already been taken off all his medication. Pt is concerned and would like to speak to the nurse.

## 2012-08-18 NOTE — Telephone Encounter (Signed)
Spoke with pt, he is currently off his bp meds due to rash. His bp is running 140's/high 80's. He wants to know what else he can try for his bp. Unable to take lopressor. Will forward for dr Jens Som review

## 2012-08-18 NOTE — Telephone Encounter (Signed)
norvasc 5 mg daily Jason Morris  

## 2012-08-18 NOTE — Telephone Encounter (Signed)
Spoke with pt, Aware of dr crenshaw's recommendations.  °

## 2012-08-25 ENCOUNTER — Telehealth: Payer: Self-pay | Admitting: Cardiology

## 2012-08-25 NOTE — Telephone Encounter (Signed)
New Problem   Bp not coming down from 160/78.Marland Kitchen Pt states its been like that for 5 years. The question is ... Is this normal please advise.

## 2012-08-26 NOTE — Telephone Encounter (Signed)
Spoke with pt, he is concerned because he is having a tightness in his neck and throat. He reports the area is swollen and hurts worse when he turns his head. Pt is concerned it is related to his carotid artery. Reassurance given to the pt, if related to carotid the symptoms would be different. Also the pts bp is running 150's/80's, he has been on the amlodipine for 2 days. He is trying to watch his diet. He has a follow up scheduled next week. He will track his bp and bring those readings to his appt. He will call with further questions or concerns prior to being seen.

## 2012-08-26 NOTE — Telephone Encounter (Signed)
Follow up ° ° °Pt returning your call °

## 2012-08-28 ENCOUNTER — Telehealth: Payer: Self-pay | Admitting: Cardiology

## 2012-08-28 NOTE — Telephone Encounter (Signed)
New prob  Pt is needing some clarification regarding his records he got from ER and what he is being told when he comes into the office. He said he also has major problems in his lungs and he said nothing was ever mention regarding his lungs before.

## 2012-08-28 NOTE — Telephone Encounter (Addendum)
Spoke with pt, he had to go back to the ER yesterday. He was coughing up blood and his WBC's were high. They stated he had an infection. They also talked about his CT scan and that there was a problem with his lungs. Looking over the report I assured the pt there were no masses or suspicious nodules. He has changes in the lungs that are probably related to his smoking. All the other questions the pt had were answered. He is aware of his appt next week to discuss all his finding with dr Jens Som.

## 2012-09-03 ENCOUNTER — Encounter: Payer: Self-pay | Admitting: Cardiology

## 2012-09-03 ENCOUNTER — Ambulatory Visit (INDEPENDENT_AMBULATORY_CARE_PROVIDER_SITE_OTHER): Payer: Self-pay | Admitting: Cardiology

## 2012-09-03 VITALS — BP 160/102 | HR 100 | Ht 71.0 in | Wt 264.0 lb

## 2012-09-03 DIAGNOSIS — F419 Anxiety disorder, unspecified: Secondary | ICD-10-CM

## 2012-09-03 DIAGNOSIS — R079 Chest pain, unspecified: Secondary | ICD-10-CM

## 2012-09-03 DIAGNOSIS — F411 Generalized anxiety disorder: Secondary | ICD-10-CM

## 2012-09-03 DIAGNOSIS — I10 Essential (primary) hypertension: Secondary | ICD-10-CM

## 2012-09-03 DIAGNOSIS — E1159 Type 2 diabetes mellitus with other circulatory complications: Secondary | ICD-10-CM | POA: Insufficient documentation

## 2012-09-03 LAB — METANEPHRINES, URINE, 24 HOUR

## 2012-09-03 LAB — CATECHOLAMINES, FRACTIONATED, URINE, 24 HOUR

## 2012-09-03 MED ORDER — ROSUVASTATIN CALCIUM 40 MG PO TABS: 40.0000 mg | ORAL_TABLET | Freq: Every day | ORAL | Status: DC

## 2012-09-03 MED ORDER — AMLODIPINE BESYLATE 5 MG PO TABS: 5.0000 mg | ORAL_TABLET | Freq: Every day | ORAL | Status: DC

## 2012-09-03 MED ORDER — ASPIRIN EC 81 MG PO TBEC: 81.0000 mg | DELAYED_RELEASE_TABLET | Freq: Every day | ORAL | Status: DC

## 2012-09-03 NOTE — Assessment & Plan Note (Signed)
Patient's blood pressure is elevated. He is extremely anxious in the office today and this may be contributing. I will schedule renal Dopplers and screen for pheochromocytoma with urine for metanephrines and catecholamines. Check sedimentation rate. Add Norvasc 5 mg daily and increase as needed.

## 2012-09-03 NOTE — Progress Notes (Signed)
   HPI: 37 yo male for fu of CAD. Admitted to [facility] 8/14 with CP. Enzymes negative. 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 Cardiac CT 8/14 showed aneurysmal dilatation of RCA, 2 serial 70 percent lesions in prox RCA and 50 PDA. CA score 3557. Patient developed a rash following DC and lisinopril and lipitor DCed. He stopped Norvasc because he thought it was decreasing his heart rate. He has been treated with steroids and antibiotics for urticaria and rash. Patient does have intermittent chest pain and. Locations on his chest. The increase with bending over or moving his neck. He does not have exertional chest pain. He denies dyspnea on exertion, orthopnea, pedal edema or syncope. He describes intermittent pain in his neck and left upper extremity.   Current Outpatient Prescriptions  Medication Sig Dispense Refill  . doxycycline (VIBRAMYCIN) 100 MG capsule Take 100 mg by mouth 2 (two) times daily.      . [DISCONTINUED] atorvastatin (LIPITOR) 80 MG tablet Take 1 tablet (80 mg total) by mouth daily at 6 PM.  30 tablet  5   No current facility-administered medications for this visit.     Past Medical History  Diagnosis Date  . Hypertension   . Anxiety   . Chest pain     a. admx 8/14 - CEs neg but coronary Ca on chest CTA  . GERD (gastroesophageal reflux disease)   . Migraines     "get them if I lay on my left side or am active outside; pretty much qd til the last 5 days" (08/09/2012)  . Chronic lower back pain   . Depression   . HLD (hyperlipidemia)   . CAD (coronary artery disease)     advanced; a. Chest CTA 8/14 with coronary Ca and RCA aneurysmal dilatation    Past Surgical History  Procedure Laterality Date  . Myringotomy with tube placement Bilateral     "4-5 times as a child" (08/09/2012)    History   Social History  . Marital Status: Divorced    Spouse Name: N/A    Number of Children: N/A  . Years of Education:  N/A   Occupational History  . Not on file.   Social History Main Topics  . Smoking status: Heavy Tobacco Smoker -- 0.50 packs/day for 17 years    Types: Cigarettes  . Smokeless tobacco: Never Used     Comment: 08/09/2012 "smoked 3 ppd til 1 month ago"   . Alcohol Use: 0.0 oz/week     Comment: 08/09/2012 "had a mixed drink once in the past year"  . Drug Use: No  . Sexual Activity: Yes   Other Topics Concern  . Not on file   Social History Narrative  . No narrative on file    ROS: no fevers or chills, hemoptysis, dysphasia, odynophagia, melena, hematochezia, dysuria, hematuria, rash, seizure activity, orthopnea, PND, pedal edema, claudication. Remaining systems are negative.  Physical Exam: Well-developed well-nourished, extremely anxious in no acute distress.  Skin is warm and dry.  HEENT is normal.  Neck is supple.  Chest is clear to auscultation with normal expansion.  Cardiovascular exam is regular rate and rhythm.  Abdominal exam nontender or distended. No masses palpated. Extremities show no edema. neuro grossly intact

## 2012-09-03 NOTE — Assessment & Plan Note (Signed)
Add aspirin and Crestor 40 mg daily. Check lipids and liver in 6 weeks.

## 2012-09-03 NOTE — Patient Instructions (Addendum)
Your physician recommends that you schedule a follow-up appointment in: 6-8 WEEKS WITH DR CRENSHAW IN [place]  START ASPIRIN 81 MG ONCE DAILY  START CRESTOR 40 MG ONCE DAILY  Your physician recommends that you return for lab work in: 6 WEEKS IN [place]=DO NOT EAT PRIOR TO LABS  START AMLODIPINE 5 MG ONCE DAILY  Your physician has requested that you have en exercise stress myoview. For further information please visit [email or web address] Please follow instruction sheet, as given.   Your physician has requested that you have a renal artery duplex. During this test, an ultrasound is used to evaluate blood flow to the kidneys. Allow one hour for this exam. Do not eat after midnight the day before and avoid carbonated beverages. Take your medications as you usually do.   Your physician recommends that you HAVE LAB WORK TODAY

## 2012-09-03 NOTE — Assessment & Plan Note (Signed)
Will refer to primary care

## 2012-09-03 NOTE — Assessment & Plan Note (Signed)
Symptoms are not consistent with coronary disease. He does have moderate disease noted on his cardiac CT. Continue medications for coronary disease. Schedule Myoview to see if RCA disease is significant.

## 2012-09-10 ENCOUNTER — Other Ambulatory Visit: Payer: Self-pay | Admitting: Cardiology

## 2012-09-10 ENCOUNTER — Ambulatory Visit: Payer: Self-pay | Admitting: Cardiology

## 2012-09-13 LAB — CATECHOLAMINES, FRACTIONATED, URINE, 24 HOUR
Calculated Total (E+NE): 44 mcg/24 h (ref 26–121)
Creatinine, Urine mg/day-CATEUR: 1.04 g/(24.h) (ref 0.63–2.50)
Dopamine, 24 hr Urine: 186 mcg/24 h (ref 52–480)
Norepinephrine, 24 hr Ur: 44 mcg/24 h (ref 15–100)

## 2012-09-15 ENCOUNTER — Telehealth: Payer: Self-pay | Admitting: Cardiology

## 2012-09-15 NOTE — Telephone Encounter (Signed)
Pt read that pomegranate juice may lower cholesterol in 2 weeks.  He is asking Dr Ludwig Clarks opinion about using pomegranate juice to lower cholesterol. I will forward to Dr Jens Som.

## 2012-09-15 NOTE — Telephone Encounter (Signed)
Called patient, aware that there is no data supporting this juice to lower cholesterol levels per Dr. Jens Som.

## 2012-09-15 NOTE — Telephone Encounter (Signed)
No data for juice Jason Morris

## 2012-09-15 NOTE — Telephone Encounter (Signed)
New Problem  Pt calling about 24 hr urine results//

## 2012-09-15 NOTE — Telephone Encounter (Signed)
Spoke with patient about 24 hour urine results.  Pt states he does not have insurance right now but hopes to have it in about 1 month. Pt scheduled for myoview and renal artery doppler 09/22/12. Pt states he thinks he has figured out his chest pain, he feels it is related to disc problem in his neck or back. He states turning his head to the right seems to help. He states his BP has been in the 140s/80s range since starting amlodipine. Based on 24 hour urine results, BP readings and other symptoms pt asking if he can delay myoview and renal artery doppler scheduled for 09/22/12 until he has insurance. He said he has not started crestor yet because he cannot afford it. I will forward to Dr Jens Som for review.

## 2012-09-15 NOTE — Telephone Encounter (Signed)
Ok to delay renal dopplers and myoview; dc crestor; lipitor 80 mg daily; lipids and liver in six weeks Olga Millers

## 2012-09-15 NOTE — Telephone Encounter (Signed)
Pt aware OK to delay testing. Pt states he was on several medications, including lipitor, and developed hives 4-5 days after starting meds. Lipitor and some other meds were  stopped, he was given prednisone, and symptoms resolved. Pt asking if there is a different med he can take for cholesterol given that he may be allergic to lipitor.

## 2012-09-16 ENCOUNTER — Telehealth: Payer: Self-pay | Admitting: *Deleted

## 2012-09-16 NOTE — Telephone Encounter (Signed)
I called patient with urine study results. He is scheduled for a renal artery duplex and stress test on Monday 9/15. He is without insurance right now and would like to know if Dr. Jens Som is ok if he waits to have these test done in about a month. He reports that the symptoms he is having are essentially unchanged. Symptoms include tingling to the left arm that occurs from the shoulder to the wrist, but on the underside of the arm. His skin is tender and tingling usually occurs with bending his arm. He also notices some relief with turning his head from side to side. Some complaints of pain to the mid chest, but he thinks this may be more muscular in nature. Will forward to Dr. Jens Som for recommendations. We will need to call the patient back by the end of the week due to test set for Monday.

## 2012-09-17 NOTE — Telephone Encounter (Signed)
Left pt a detail message and to call back. 

## 2012-09-17 NOTE — Telephone Encounter (Signed)
Pt aware that Dr. Jens Som okay for pt to have Renal artery duplex and Stress test in a month.

## 2012-09-17 NOTE — Telephone Encounter (Signed)
Ok to have tests one month Jason Morris

## 2012-09-18 ENCOUNTER — Telehealth: Payer: Self-pay

## 2012-09-22 ENCOUNTER — Ambulatory Visit: Payer: Self-pay | Admitting: Family Medicine

## 2012-09-22 ENCOUNTER — Encounter (HOSPITAL_COMMUNITY): Payer: Self-pay

## 2012-10-22 ENCOUNTER — Encounter: Payer: Self-pay | Admitting: Cardiology

## 2012-10-22 NOTE — Progress Notes (Signed)
HPI: fu CAD. Admitted to [facility] 8/14 with CP. Enzymes negative. 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 Cardiac CT 8/14 showed aneurysmal dilatation of RCA, 2 serial 70 percent lesions in prox RCA and 50 PDA. CA score 3557. Patient developed a rash following DC and lisinopril and lipitor DCed. He stopped Norvasc because he thought it was decreasing his heart rate. Echo 8/14 revealed normal EF, mild LAE. Laboratories in September of 2014 showed a mildly elevated normetanephrine level at 508 but normal metanephrines. Urine catecholamines were normal. Sedimentation rate 17. When I last saw him we ordered renal Dopplers and a stress Myoview. Those have yet to be completed.   Current Outpatient Prescriptions  Medication Sig Dispense Refill  . amLODipine (NORVASC) 5 MG tablet Take 1 tablet (5 mg total) by mouth daily.  30 tablet  11  . aspirin EC 81 MG tablet Take 1 tablet (81 mg total) by mouth daily.  90 tablet  3  . doxycycline (VIBRAMYCIN) 100 MG capsule Take 100 mg by mouth 2 (two) times daily.      . rosuvastatin (CRESTOR) 40 MG tablet Take 1 tablet (40 mg total) by mouth daily.  30 tablet  12  . [DISCONTINUED] atorvastatin (LIPITOR) 80 MG tablet Take 1 tablet (80 mg total) by mouth daily at 6 PM.  30 tablet  5   No current facility-administered medications for this visit.     Past Medical History  Diagnosis Date  . Hypertension   . Anxiety   . Chest pain     a. admx 8/14 - CEs neg but coronary Ca on chest CTA  . GERD (gastroesophageal reflux disease)   . Migraines     "get them if I lay on my left side or am active outside; pretty much qd til the last 5 days" (08/09/2012)  . Chronic lower back pain   . Depression   . HLD (hyperlipidemia)   . CAD (coronary artery disease)     advanced; a. Chest CTA 8/14 with coronary Ca and RCA aneurysmal dilatation    Past Surgical History  Procedure Laterality Date  .  Myringotomy with tube placement Bilateral     "4-5 times as a child" (08/09/2012)    History   Social History  . Marital Status: Divorced    Spouse Name: N/A    Number of Children: N/A  . Years of Education: N/A   Occupational History  . Not on file.   Social History Main Topics  . Smoking status: Heavy Tobacco Smoker -- 0.50 packs/day for 17 years    Types: Cigarettes  . Smokeless tobacco: Never Used     Comment: 08/09/2012 "smoked 3 ppd til 1 month ago"   . Alcohol Use: 0.0 oz/week     Comment: 08/09/2012 "had a mixed drink once in the past year"  . Drug Use: No  . Sexual Activity: Yes   Other Topics Concern  . Not on file   Social History Narrative  . No narrative on file    ROS: no fevers or chills, productive cough, hemoptysis, dysphasia, odynophagia, melena, hematochezia, dysuria, hematuria, rash, seizure activity, orthopnea, PND, pedal edema, claudication. Remaining systems are negative.  Physical Exam: Well-developed well-nourished in no acute distress.  Skin is warm and dry.  HEENT is normal.  Neck is supple.  Chest is clear to auscultation with normal expansion.  Cardiovascular exam is regular rate and rhythm.  Abdominal exam nontender  or distended. No masses palpated. Extremities show no edema. neuro grossly intact  ECG     This encounter was created in error - please disregard.

## 2012-10-27 ENCOUNTER — Encounter (HOSPITAL_COMMUNITY): Payer: Self-pay

## 2012-10-29 ENCOUNTER — Encounter: Payer: Self-pay | Admitting: Cardiology

## 2012-11-13 ENCOUNTER — Other Ambulatory Visit: Payer: Self-pay

## 2013-01-27 ENCOUNTER — Telehealth: Payer: Self-pay | Admitting: Cardiology

## 2013-01-27 MED ORDER — DILTIAZEM HCL ER COATED BEADS 180 MG PO CP24: 180.0000 mg | ORAL_CAPSULE | Freq: Every day | ORAL | Status: DC

## 2013-01-27 NOTE — Telephone Encounter (Signed)
Spoke with pt, Aware of dr Ludwig Clarkscrenshaw's recommendations. He has taken lopressor in the past and had to take steroids due to hives. Will discuss with dr Jens Somcrenshaw

## 2013-01-27 NOTE — Telephone Encounter (Signed)
New message    [place] was in hosp in [place] for [place] bp.  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  He has been released and want to talk to a nurse regarding his bp.

## 2013-01-27 NOTE — Telephone Encounter (Signed)
Spoke with pt, [place] is in [place] at present, he went to the ER to be checked out because he felt very fatigued. His bp at the ER was as high as 180/120. The workup he had at that hosp was okay by his report. He was told his bp issues were related to anxiety and he was given a script for vistaril, he has not gotten it filled. He reports his amlodipine causes him to have a headache and after taking it his bp will go up. He has not taken his meds this am and his bp at present is 168/85. He reports he is under a lot of stress. He will not be back for two weeks. Will forward for dr Jens Somcrenshaw review

## 2013-01-27 NOTE — Telephone Encounter (Signed)
Add toprol 50 mg daily Olga MillersBrian Francesa Eugenio

## 2013-01-27 NOTE — Telephone Encounter (Signed)
Dr Jens Somcrenshaw aware, pt will try diltiazem cd 180 mg once daily. Pt made aware, script sent to the pharm. He will cont to track his bp.

## 2013-02-03 ENCOUNTER — Telehealth: Payer: Self-pay | Admitting: Cardiology

## 2013-02-03 NOTE — Telephone Encounter (Signed)
Will forward for dr crenshaw review  

## 2013-02-03 NOTE — Telephone Encounter (Signed)
New message   Has some questions . Blood pressure staying high. Today  159/89, 102.  Went to hospital  2 days  Ago . 210/120 .   Should medication be changed.

## 2013-02-04 MED ORDER — DILTIAZEM HCL ER COATED BEADS 360 MG PO CP24: 360.0000 mg | ORAL_CAPSULE | Freq: Every day | ORAL | Status: DC

## 2013-02-04 NOTE — Telephone Encounter (Signed)
Detailed message left on pt voice mail. Script sent to [facility]. He will call with questions.

## 2013-02-04 NOTE — Telephone Encounter (Signed)
Change cardizem to 360 mg daily Olga MillersBrian Jazzelle Zhang

## 2013-02-10 ENCOUNTER — Telehealth: Payer: Self-pay | Admitting: Cardiology

## 2013-02-10 NOTE — Telephone Encounter (Signed)
New message    C/O blood pressure issues today  159/91 . Heart rate 76 . No chest pain, no sob , only headache. Out of anxiety medication

## 2013-02-10 NOTE — Telephone Encounter (Addendum)
Called patient and discussed BP. He is currently taking Norvasc 5mg  every day. Advised that on 1/28 Cardizem CD 360mg  was sent to his pharmacy. He is not sure if he picked this up but will check when he goes home. Advised that per Dr.Crenshaw he is to be taking Cardizem CD 360mg  every day. He is aware that Dr.Crenshaw doe not order any tranquilizers and he would need to obtain this from PCP. Made him a ROV with [facility] for 2/17. He will record his BP 2 times per day and bring in BP log to the next appointment.

## 2013-02-11 ENCOUNTER — Telehealth: Payer: Self-pay | Admitting: Cardiology

## 2013-02-11 NOTE — Telephone Encounter (Signed)
New message     bp is high 189/110.  Feeling a little dizzy.

## 2013-02-11 NOTE — Telephone Encounter (Signed)
Spoke with pt, he is only taking the diltiazem, aware he does not need to take amlodipine and diltiazem together. He is aware to take 360 mg of diltiazem. He is out of ativan and is anxious that he has nothing to help with his anxiety. Reassurance given to the pt. He saw a primary care doctor last year and will find their number and get an appointment to discuss the anxiety issues. He has a follow up scheduled with dr Jens Somcrenshaw. The dizziness he is getting occurs anytime, usually after he has been up walking. He will keep himself hydrated and call with problems prior to appt. Patient voiced understanding

## 2013-02-11 NOTE — Telephone Encounter (Signed)
New Problem:  Pt states he is still having BP concerns

## 2013-02-11 NOTE — Telephone Encounter (Signed)
New Problem:  Pt is c/o high BP.Jason Morris. States his BP is 180/95.Jason Morris. Pt would like to speak to debra.

## 2013-02-11 NOTE — Telephone Encounter (Signed)
Left message for pt to call.

## 2013-02-17 MED ORDER — CLONIDINE HCL 0.1 MG PO TABS: 0.1000 mg | ORAL_TABLET | Freq: Two times a day (BID) | ORAL | Status: DC

## 2013-02-17 NOTE — Telephone Encounter (Signed)
Spoke with pt, he seems to be doing okay. He reports he is taking the diltiazem 180 mg twice daily and it seems to be helping some. His bp is still running 180's. His bp seems to be more elevated in the evenings. He is having some legs discomfort and chest discomfort but it is related to how he slept. Discussed with dr Jens Somcrenshaw, pt was given clonidine in the ER the last time. Will try clonidine 0.1 mg BID. Pt has an appt next week with dr Jens Somcrenshaw, he will call with problems prior to his appt. Pt agreed with this plan.

## 2013-02-24 ENCOUNTER — Ambulatory Visit: Payer: Self-pay | Admitting: Cardiology

## 2013-02-25 ENCOUNTER — Telehealth: Payer: Self-pay | Admitting: Cardiology

## 2013-02-25 NOTE — Telephone Encounter (Signed)
New message     Talk to a nurse regarding upcoming appt

## 2013-02-25 NOTE — Telephone Encounter (Signed)
Spoke with pt, he stopped the diltiazem because it was causing a lot of chest pain. Since starting the clonidine he reports his bp is 134/82. His appt was rescheduled.

## 2013-02-26 ENCOUNTER — Telehealth: Payer: Self-pay | Admitting: *Deleted

## 2013-02-26 NOTE — Telephone Encounter (Signed)
Received call from patient, he is OTR truck driver, currently out of town.  C/O diarrhea, abd cramping, mid upper epigastric and mid back pain since last night approx 10pm.  Thinks he was febrile but did not have Temp checked.  EMT/ambulance came last night.  Told him normal EKG. BP 160/84, HR 125 and possibly anxiety is the cause.  He chose not to seek further medical attention at that time.   Symptoms have continued throughout the night.  Currently no sign of fever, still has all other symptoms. Discussed with Dr. Jens Somrenshaw and his primary RN who know this patient.   They instruct that he needs to obtain PCP to manage his symptoms, anxiety in particular, as they are likely not cardiac. Discussed this at length with patient.  His BP is elevated this am 164/89 but he has not taken his meds yet today.  He has had ativan in past which was effective.   Instructed him to try tylenol or ibuprofen, replenish fluids and rest.  Pt verbalizes understanding.  Gave him # for [place]CP in [place] where he sees Dr. Jens Somrenshaw.  He is to call and schedule appointment.

## 2013-03-01 ENCOUNTER — Telehealth: Payer: Self-pay | Admitting: Physician Assistant

## 2013-03-01 NOTE — Telephone Encounter (Signed)
[name] called on 2/21 and 2/22. He has multiple somatic complaints. He describes headaches, left-sided body cramping, left-sided chest pain, weakness and admits to some anxiety. His blood pressure has been up and down some. He has a recent GI illness and got off schedule with his medications because of this. He is working and getting back on but has become a large several times when he thought he might be having side effects from the medications.  Total time spent discussing this with the patient over 2 days was greater than 45 minutes. I reviewed the results of all previous cardiology testing done and reviewed his symptoms with him extensively. I attempted to reassure him that there was no obvious cardiac cause for his symptoms but advised him that if he felt that further evaluation was needed he should come to the emergency room.  Advised him I would route this message to triage so he will get a phone call tomorrow and be offered the earliest available appointment with an extender or with Dr. Jens Somrenshaw.

## 2013-03-02 ENCOUNTER — Ambulatory Visit (INDEPENDENT_AMBULATORY_CARE_PROVIDER_SITE_OTHER): Payer: Self-pay | Admitting: Nurse Practitioner

## 2013-03-02 ENCOUNTER — Encounter: Payer: Self-pay | Admitting: Nurse Practitioner

## 2013-03-02 ENCOUNTER — Encounter: Payer: Self-pay | Admitting: *Deleted

## 2013-03-02 VITALS — BP 132/90 | HR 72 | Ht 71.5 in | Wt 287.4 lb

## 2013-03-02 DIAGNOSIS — M549 Dorsalgia, unspecified: Secondary | ICD-10-CM

## 2013-03-02 DIAGNOSIS — R079 Chest pain, unspecified: Secondary | ICD-10-CM

## 2013-03-02 DIAGNOSIS — R5381 Other malaise: Secondary | ICD-10-CM

## 2013-03-02 DIAGNOSIS — R300 Dysuria: Secondary | ICD-10-CM

## 2013-03-02 DIAGNOSIS — E785 Hyperlipidemia, unspecified: Secondary | ICD-10-CM

## 2013-03-02 DIAGNOSIS — I10 Essential (primary) hypertension: Secondary | ICD-10-CM

## 2013-03-02 DIAGNOSIS — R0600 Dyspnea, unspecified: Secondary | ICD-10-CM

## 2013-03-02 DIAGNOSIS — R0609 Other forms of dyspnea: Secondary | ICD-10-CM

## 2013-03-02 DIAGNOSIS — R5383 Other fatigue: Secondary | ICD-10-CM

## 2013-03-02 DIAGNOSIS — R0989 Other specified symptoms and signs involving the circulatory and respiratory systems: Secondary | ICD-10-CM

## 2013-03-02 LAB — CBC WITH DIFFERENTIAL/PLATELET
Basophils Absolute: 0 10*3/uL (ref 0.0–0.1)
Basophils Relative: 0.3 % (ref 0.0–3.0)
Eosinophils Absolute: 0.1 10*3/uL (ref 0.0–0.7)
Eosinophils Relative: 2 % (ref 0.0–5.0)
HCT: 46 % (ref 39.0–52.0)
Hemoglobin: 15 g/dL (ref 13.0–17.0)
Lymphocytes Relative: 45.4 % (ref 12.0–46.0)
Lymphs Abs: 2.8 10*3/uL (ref 0.7–4.0)
MCHC: 32.6 g/dL (ref 30.0–36.0)
MCV: 89.1 fl (ref 78.0–100.0)
Monocytes Absolute: 0.7 10*3/uL (ref 0.1–1.0)
Monocytes Relative: 10.5 % (ref 3.0–12.0)
Neutro Abs: 2.6 10*3/uL (ref 1.4–7.7)
Neutrophils Relative %: 41.8 % — ABNORMAL LOW (ref 43.0–77.0)
Platelets: 213 10*3/uL (ref 150.0–400.0)
RBC: 5.17 Mil/uL (ref 4.22–5.81)
RDW: 12.5 % (ref 11.5–14.6)
WBC: 6.3 10*3/uL (ref 4.5–10.5)

## 2013-03-02 LAB — HEPATIC FUNCTION PANEL
ALT: 58 U/L — ABNORMAL HIGH (ref 0–53)
AST: 32 U/L (ref 0–37)
Albumin: 4 g/dL (ref 3.5–5.2)
Alkaline Phosphatase: 52 U/L (ref 39–117)
Bilirubin, Direct: 0 mg/dL (ref 0.0–0.3)
Total Bilirubin: 0.7 mg/dL (ref 0.3–1.2)
Total Protein: 7.3 g/dL (ref 6.0–8.3)

## 2013-03-02 LAB — BASIC METABOLIC PANEL
BUN: 7 mg/dL (ref 6–23)
CO2: 27 mEq/L (ref 19–32)
Calcium: 9.4 mg/dL (ref 8.4–10.5)
Chloride: 103 mEq/L (ref 96–112)
Creatinine, Ser: 1 mg/dL (ref 0.4–1.5)
GFR: 87.88 mL/min (ref >60.00)
Glucose, Bld: 93 mg/dL (ref 70–99)
Potassium: 3.7 mEq/L (ref 3.5–5.1)
Sodium: 138 mEq/L (ref 135–145)

## 2013-03-02 LAB — URINALYSIS, ROUTINE W REFLEX MICROSCOPIC
Bilirubin Urine: NEGATIVE
Hgb urine dipstick: NEGATIVE
Ketones, ur: NEGATIVE
Leukocytes, UA: NEGATIVE
Nitrite: NEGATIVE
RBC / HPF: NONE SEEN (ref 0–?)
Specific Gravity, Urine: 1.015 (ref 1.000–1.030)
Total Protein, Urine: NEGATIVE
Urine Glucose: NEGATIVE
Urobilinogen, UA: 0.2 (ref 0.0–1.0)
pH: 6.5 (ref 5.0–8.0)

## 2013-03-02 LAB — BRAIN NATRIURETIC PEPTIDE: Pro B Natriuretic peptide (BNP): 14 pg/mL (ref 0.0–100.0)

## 2013-03-02 LAB — LIPID PANEL
Cholesterol: 206 mg/dL — ABNORMAL HIGH (ref 0–200)
HDL: 25 mg/dL — ABNORMAL LOW (ref >39.00)
Total CHOL/HDL Ratio: 8
Triglycerides: 220 mg/dL — ABNORMAL HIGH (ref 0.0–149.0)
VLDL: 44 mg/dL — ABNORMAL HIGH (ref 0.0–40.0)

## 2013-03-02 LAB — LDL CHOLESTEROL, DIRECT: Direct LDL: 151.2 mg/dL

## 2013-03-02 LAB — TROPONIN I: Troponin I: <0.3 ng/mL (ref <0.30)

## 2013-03-02 LAB — TSH: TSH: 1.36 u[IU]/mL (ref 0.35–5.50)

## 2013-03-02 LAB — HEMOGLOBIN A1C: Hgb A1c MFr Bld: 6.2 % (ref 4.6–6.5)

## 2013-03-02 MED ORDER — ROSUVASTATIN CALCIUM 40 MG PO TABS
20.0000 mg | ORAL_TABLET | Freq: Every day | ORAL | Status: DC
Start: 2013-03-02 — End: 2013-10-27

## 2013-03-02 NOTE — Patient Instructions (Addendum)
Stay on your current medicines for now  I am giving you samples of the Crestor 20 mg to try one a day  Try to stop smoking  We will get your stress test (stress Myoview) and renal doppler next week  See Dr. Jens Somrenshaw back for follow up and discussion of your tests.  Call the [facility] office at [phone] if you have any questions, problems or concerns.

## 2013-03-02 NOTE — Telephone Encounter (Signed)
Discussed weekend symptoms which have persisted and present this am.  Scheduled appointment today at 10:30am.

## 2013-03-02 NOTE — Progress Notes (Signed)
Jason Morris Date of Birth: [birth year] Medical Record #[id number]  History of Present Illness: Jason Morris is seen back today for a work in visit. Seen for Dr. Jens Morris. He is a 38 year old male with multiple issues. 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 Cardiac CT in 08/2012 with aneurysmal dilatation of the RCA, 2 serial 70% lesions in the proximal RCA and 50% in the PD. CA score of 3557. Other issues include HTN, ongoing tobacco abuse, HLD, anxiety, migraines, chronic low back pain, & depression.  He has stopped Lipitor and Lisinopril in the past due to rash. He has also stopped Norvasc due to thinking it was decreasing his heart rate. It is noted that he has not tolerated beta blocker.   Last seen here in August of 2014. Myoview ordered to see if the RCA disease was significant - looks like this was never done. Never got his renal doppler. Has had numerous phone calls with numerous medicine changes - very hard to follow what he has taken and not taken, what is tolerated and what is not.   Comes in today. Here with his mom today. He is here for multiple somatic complaints. He notes that he has had the stomach but last week with multiple bouts of diarrhea, vomiting x 1, with fever and feeling bloated. Felt a burning in his chest that got better with Zantac and gas pills. Called the EMS several times - negative EKGs. BP was up at that time as well. This resolved but he has pain that moves around in his chest. Feels a cramp in his chest if he bends over. Pain in his lower back - urine is dark despite trying to drink more water. More fatigued. Some shortness of breath. Used someone's ProAir and felt "like a new man". Feels his heart pounding. NO clonidine over the past few days - not clear as to why. He is taking his Diltiazem but has had no medicines today. He notes that his symptoms get better with walking and moving around. He admits to being very anxious. Mom  says he seems to have panic spells. No PCP. Will get his insurance as of March 1st. He works as a Naval architecttruck driver. Mom notes that his diet is bad - drinks lots of 2 liter drinks at a time. Still smoking but cutting down. Never picked up the Crestor due to cost. His weight continues to climb. Never got his Myoview or renal duplex due to no insurance/cost.   Current Outpatient Prescriptions  Medication Sig Dispense Refill  . aspirin EC 81 MG tablet Take 1 tablet (81 mg total) by mouth daily.  90 tablet  3  . cloNIDine (CATAPRES) 0.1 MG tablet Take 0.1 mg by mouth as needed.      . diltiazem (CARDIZEM CD) 360 MG 24 hr capsule Take 1 capsule (360 mg total) by mouth daily.  30 capsule  12  . Garlic 1000 MG CAPS Take 1,000 mg by mouth daily.      Marland Kitchen. LORazepam (ATIVAN) 0.5 MG tablet Take 0.5 mg by mouth as needed for anxiety.      . Omega-3 Fatty Acids (FISH OIL) 1000 MG CAPS Take 600 mg by mouth 3 (three) times daily.      . rosuvastatin (CRESTOR) 40 MG tablet Take 1 tablet (40 mg total) by mouth daily.  30 tablet  12  . [DISCONTINUED] atorvastatin (LIPITOR) 80 MG tablet Take 1 tablet (80 mg total)  by mouth daily at 6 PM.  30 tablet  5   No current facility-administered medications for this visit.    Allergies  Allergen Reactions  . Lipitor [Atorvastatin] Hives  . Lopressor [Metoprolol Tartrate] Shortness Of Breath    Felt like he was going to die , could not breath & syncope    Past Medical History  Diagnosis Date  . Hypertension   . Anxiety   . Chest pain     a. admx 8/14 - CEs neg but coronary Ca on chest CTA  . GERD (gastroesophageal reflux disease)   . Migraines     "get them if I lay on my left side or am active outside; pretty much qd til the last 5 days" (08/09/2012)  . Chronic lower back pain   . Depression   . HLD (hyperlipidemia)   . CAD (coronary artery disease)     advanced; a. Chest CTA 8/14 with coronary Ca and RCA aneurysmal dilatation    Past Surgical History  Procedure  Laterality Date  . Myringotomy with tube placement Bilateral     "4-5 times as a child" (08/09/2012)    History  Smoking status  . Heavy Tobacco Smoker -- 0.50 packs/day for 17 years  . Types: Cigarettes  Smokeless tobacco  . Never Used    Comment: 08/09/2012 "smoked 3 ppd til 1 month ago"     History  Alcohol Use  . 0.0 oz/week    Comment: 08/09/2012 "had a mixed drink once in the past year"    History reviewed. No pertinent family history.  Review of Systems: The review of systems is per the HPI.  All other systems were reviewed and are negative.  Physical Exam: BP 132/90  Pulse 72  Ht 5' 11.5" (1.816 m)  Wt 287 lb 6.4 oz (130.364 kg)  BMI 39.53 kg/m2  SpO2 96% Patient is alert and in no acute distress. He is obese. Quite anxious. Skin is warm and dry. Color is normal.  HEENT is unremarkable. Normocephalic/atraumatic. PERRL. Sclera are nonicteric. Neck is supple. No masses. No JVD. Lungs are clear. Cardiac exam shows a regular rate and rhythm. Abdomen is soft. Extremities are without edema. Gait and ROM are intact. No gross neurologic deficits noted.  Wt Readings from Last 3 Encounters:  03/02/13 287 lb 6.4 oz (130.364 kg)  09/03/12 264 lb (119.75 kg)  08/09/12 264 lb 12.4 oz (120.1 kg)    LABORATORY DATA: PENDING  EKG today shows sinus rhythm - no acute changes.   Lab Results  Component Value Date   WBC 9.2 08/08/2012   HGB 15.1 08/08/2012   HCT 42.5 08/08/2012   PLT 213 08/08/2012   GLUCOSE 94 08/08/2012   CHOL 222* 08/09/2012   TRIG 172* 08/09/2012   HDL 24* 08/09/2012   LDLCALC 164* 08/09/2012   NA 135 08/08/2012   K 4.2 08/08/2012   CL 99 08/08/2012   CREATININE 1.02 08/08/2012   BUN 11 08/08/2012   CO2 27 08/08/2012   TSH 1.420 08/09/2012   HGBA1C 5.9* 08/09/2012    Echo Study Conclusions from August 2014  - Left ventricle: The cavity size was normal. Wall thickness was normal. Systolic function was normal. The estimated ejection fraction was in the range of 55% to 60%.  Wall motion was normal; there were no regional wall motion abnormalities. Left ventricular diastolic function parameters were normal. - Left atrium: The atrium was mildly dilated.  CARDIAC CT DETAILED FINDINGS:  Quality of Study: Good  Left Main: No plaque or stenosis  Left Anterior Descending: The proximal LAD was ectatic. There was calcified plaque in the proximal LAD with no more than mild stenosis. There was a moderate-sized 1st diagonal with no significant disease.  Left Circumflex: Small system. There was a small OM1 and a small OM2. There was about 50% ostial stenosis in a small branch off OM2.  Right Coronary Artery: Dominant vessel. The right coronary artery was a large, aneurysmal vessel reaching 12 mm in largest diameter. There was extensive mixed plaque throughout the RCA. There were two serial moderate stenoses in the proximal RCA. There was up to 50% stenosis in the ostial PDA.  Coronary Calcium Score: 3557 Agatston units  Other: Noncardiac findings to be reported by [facility]  IMPRESSION: 1. Ectatic LAD without significant stenosis.  2. Small LCx system.  3. Large, aneurysmal RCA reaching 12 mm in diameter. There was extensive mixed plaque throughout the RCA. There were 2 serial moderate stenoses (70% range) in the proximal RCA and the ostial PDA had up to 50% stenosis.  4. Coronary artery calcium score of 3557 Agatston Units, placing the patient in the 100th %ile for his age and gender. This suggests high risk for future cardiac event.  **END ADDENDUM** SIGNED BY: Laurey Morale Signed by Laurey Morale, MD on 08/13/2012 10:08 PM  Assessment / Plan:  1. Chest pain with known CAD - he has lots of atypical symptoms in the setting of known CAD and multiple risk factors - I was fairly blunt with him in advising that he needs to be making his health a priority - with smoking cessation, weight loss, better dietary changes, etc. Will arrange for  his stress Myoview per Dr. Ludwig Clarks original plan.   2. HTN - BP fair - looks like he is only on the CCB - will check the renal duplex. I have left him on his current regimen.   3. Ongoing tobacco abuse - discussed in depth today  4. HLD - I have given him samples of Crestor 20 mg to start on.  Will get him back to see Dr. Jens Som for discussion.   Patient is agreeable to this plan and will call if any problems develop in the interim.   Jason Macadamia, RN, ANP-C [facility] [address] [place], [place]  [address] [phone]

## 2013-03-03 ENCOUNTER — Telehealth: Payer: Self-pay | Admitting: Cardiology

## 2013-03-03 NOTE — Telephone Encounter (Signed)
New message  ° ° °Patient calling for test results.   °

## 2013-03-03 NOTE — Telephone Encounter (Signed)
Pt aware of labs  

## 2013-03-12 ENCOUNTER — Encounter (HOSPITAL_COMMUNITY): Payer: Self-pay

## 2013-03-12 ENCOUNTER — Inpatient Hospital Stay (HOSPITAL_COMMUNITY)
Admission: RE | Admit: 2013-03-12 | Discharge: 2013-03-12 | Disposition: A | Discharge status: Home / Self Care | Payer: Self-pay | Source: Ambulatory Visit | Attending: Cardiology | Admitting: Cardiology

## 2013-03-12 DIAGNOSIS — I10 Essential (primary) hypertension: Secondary | ICD-10-CM

## 2013-03-16 ENCOUNTER — Encounter: Payer: Self-pay | Admitting: Cardiology

## 2013-03-16 ENCOUNTER — Telehealth: Payer: Self-pay | Admitting: Cardiology

## 2013-03-16 NOTE — Telephone Encounter (Signed)
New Problem:  Pt is requesting a call back from the nurse. Pt states he went to the [place] office this morning... Pt did not want to reschedule his appt for today in [place].Marland Kitchen. Pt wants to speak to Dr. Para Skeansrenshaw/nurse.

## 2013-03-16 NOTE — Telephone Encounter (Signed)
Spoke with pt, he went to the wrong office this morning. 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 His app[facility] was [facility]ched[facility] after his stress testing is complete. His bp this am was 162/98 and his pulse was 90. He cont to have left sided pain that is closer to the back. He is aware of the stress testing he has in April. Pt encouraged to start exercise slowly to build his endurance. Encouraged pt to walk. Patient voiced understanding. He will call with further problems.

## 2013-03-16 NOTE — Progress Notes (Signed)
HPI: FU hypertension and CAD. [facility]14 with CP. Enzymes negative. 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 Cardiac CT 8/14 showed aneurysmal dilatation of RCA, 2 serial 70 percent lesions in prox RCA and 50 PDA. CA score 3557. Patient developed a rash following DC and lisinopril and lipitor DCed. He stopped Norvasc because he thought it was decreasing his heart rate. He has been treated with steroids and antibiotics for urticaria and rash. Previous metanephrines and catecholamines negative. He did not have renal Dopplers performed. He also did not have his nuclear study performed.   Current Outpatient Prescriptions  Medication Sig Dispense Refill  . aspirin EC 81 MG tablet Take 1 tablet (81 mg total) by mouth daily.  90 tablet  3  . cloNIDine (CATAPRES) 0.1 MG tablet Take 0.1 mg by mouth daily.       Marland Kitchen. diltiazem (CARDIZEM CD) 360 MG 24 hr capsule Take 1 capsule (360 mg total) by mouth daily.  30 capsule  12  . Garlic 1000 MG CAPS Take 1,000 mg by mouth daily.      Marland Kitchen. LORazepam (ATIVAN) 0.5 MG tablet Take 0.5 mg by mouth as needed for anxiety.      . Omega-3 Fatty Acids (FISH OIL) 1000 MG CAPS Take 600 mg by mouth 3 (three) times daily.      . rosuvastatin (CRESTOR) 40 MG tablet Take 0.5 tablets (20 mg total) by mouth daily.  30 tablet  12  . [DISCONTINUED] atorvastatin (LIPITOR) 80 MG tablet Take 1 tablet (80 mg total) by mouth daily at 6 PM.  30 tablet  5   No current facility-administered medications for this visit.     Past Medical History  Diagnosis Date  . Hypertension   . Anxiety   . Chest pain     a. admx 8/14 - CEs neg but coronary Ca on chest CTA  . GERD (gastroesophageal reflux disease)   . Migraines     "get them if I lay on my left side or am active outside; pretty much qd til the last 5 days" (08/09/2012)  . Chronic lower back pain   . Depression   . HLD (hyperlipidemia)   . CAD (coronary  artery disease)     advanced; a. Chest CTA 8/14 with coronary Ca and RCA aneurysmal dilatation    Past Surgical History  Procedure Laterality Date  . Myringotomy with tube placement Bilateral     "4-5 times as a child" (08/09/2012)    History   Social History  . Marital Status: Divorced    Spouse Name: N/A    Number of Children: N/A  . Years of Education: N/A   Occupational History  . Not on file.   Social History Main Topics  . Smoking status: Heavy Tobacco Smoker -- 0.50 packs/day for 17 years    Types: Cigarettes  . Smokeless tobacco: Never Used     Comment: 08/09/2012 "smoked 3 ppd til 1 month ago"   . Alcohol Use: 0.0 oz/week     Comment: 08/09/2012 "had a mixed drink once in the past year"  . Drug Use: No  . Sexual Activity: Yes   Other Topics Concern  . Not on file   Social History Narrative  . No narrative on file    ROS: no fevers or chills, productive cough, hemoptysis, dysphasia, odynophagia, melena, hematochezia, dysuria, hematuria, rash, seizure activity, orthopnea, PND, pedal edema, claudication. Remaining systems  are negative.  Physical Exam: Well-developed well-nourished in no acute distress.  Skin is warm and dry.  HEENT is normal.  Neck is supple.  Chest is clear to auscultation with normal expansion.  Cardiovascular exam is regular rate and rhythm.  Abdominal exam nontender or distended. No masses palpated. Extremities show no edema. neuro grossly intact  ECG     This encounter was created in error - please disregard.

## 2013-03-23 ENCOUNTER — Encounter (HOSPITAL_COMMUNITY): Payer: Self-pay

## 2013-04-20 ENCOUNTER — Ambulatory Visit: Payer: Self-pay | Admitting: Cardiology

## 2013-04-23 ENCOUNTER — Telehealth (HOSPITAL_COMMUNITY): Payer: Self-pay

## 2013-04-28 ENCOUNTER — Encounter (HOSPITAL_COMMUNITY): Payer: Self-pay

## 2013-04-28 ENCOUNTER — Inpatient Hospital Stay (HOSPITAL_COMMUNITY): Admission: RE | Admit: 2013-04-28 | Payer: Self-pay | Source: Ambulatory Visit

## 2013-04-29 ENCOUNTER — Encounter: Payer: Self-pay | Admitting: Cardiology

## 2013-04-29 NOTE — Progress Notes (Signed)
HPI: FU hypertension and CAD. Ad[facility] to [facility] 8[facility]14 with CP. Enzymes negative. 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 Cardiac CT 8/14 showed aneurysmal dilatation of RCA, 2 serial 70 percent lesions in prox RCA and 50 PDA. CA score 3557. Patient developed a rash following DC and lisinopril and lipitor DCed. He stopped Norvasc because he thought it was decreasing his heart rate. He did not tolerate a beta blocker. Renal Dopplers and nuclear study previously recommended but patient has not followed up. He has severe anxiety. Laboratories in February 2015 showed a normal troponin, normal BNP, normal TSH, normal potassium and normal hemoglobin. SGPT 58.    Current Outpatient Prescriptions  Medication Sig Dispense Refill  . aspirin EC 81 MG tablet Take 1 tablet (81 mg total) by mouth daily.  90 tablet  3  . cloNIDine (CATAPRES) 0.1 MG tablet Take 0.1 mg by mouth daily.       Marland Kitchen. diltiazem (CARDIZEM CD) 360 MG 24 hr capsule Take 1 capsule (360 mg total) by mouth daily.  30 capsule  12  . Garlic 1000 MG CAPS Take 1,000 mg by mouth daily.      Marland Kitchen. LORazepam (ATIVAN) 0.5 MG tablet Take 0.5 mg by mouth as needed for anxiety.      . Omega-3 Fatty Acids (FISH OIL) 1000 MG CAPS Take 600 mg by mouth 3 (three) times daily.      . rosuvastatin (CRESTOR) 40 MG tablet Take 0.5 tablets (20 mg total) by mouth daily.  30 tablet  12  . [DISCONTINUED] atorvastatin (LIPITOR) 80 MG tablet Take 1 tablet (80 mg total) by mouth daily at 6 PM.  30 tablet  5   No current facility-administered medications for this visit.     Past Medical History  Diagnosis Date  . Hypertension   . Anxiety   . Chest pain     a. admx 8/14 - CEs neg but coronary Ca on chest CTA  . GERD (gastroesophageal reflux disease)   . Migraines     "get them if I lay on my left side or am active outside; pretty much qd til the last 5 days" (08/09/2012)  . Chronic lower back pain     . Depression   . HLD (hyperlipidemia)   . CAD (coronary artery disease)     advanced; a. Chest CTA 8/14 with coronary Ca and RCA aneurysmal dilatation    Past Surgical History  Procedure Laterality Date  . Myringotomy with tube placement Bilateral     "4-5 times as a child" (08/09/2012)    History   Social History  . Marital Status: Divorced    Spouse Name: N/A    Number of Children: N/A  . Years of Education: N/A   Occupational History  . Not on file.   Social History Main Topics  . Smoking status: Heavy Tobacco Smoker -- 0.50 packs/day for 17 years    Types: Cigarettes  . Smokeless tobacco: Never Used     Comment: 08/09/2012 "smoked 3 ppd til 1 month ago"   . Alcohol Use: 0.0 oz/week     Comment: 08/09/2012 "had a mixed drink once in the past year"  . Drug Use: No  . Sexual Activity: Yes   Other Topics Concern  . Not on file   Social History Narrative  . No narrative on file    ROS: no fevers or chills, productive cough, hemoptysis, dysphasia, odynophagia, melena, hematochezia,  dysuria, hematuria, rash, seizure activity, orthopnea, PND, pedal edema, claudication. Remaining systems are negative.  Physical Exam: Well-developed well-nourished in no acute distress.  Skin is warm and dry.  HEENT is normal.  Neck is supple.  Chest is clear to auscultation with normal expansion.  Cardiovascular exam is regular rate and rhythm.  Abdominal exam nontender or distended. No masses palpated. Extremities show no edema. neuro grossly intact  ECG     This encounter was created in error - please disregard.

## 2013-06-17 ENCOUNTER — Encounter: Payer: Self-pay | Admitting: Cardiology

## 2013-07-23 NOTE — Telephone Encounter (Signed)
Encounter complete. 

## 2013-10-27 ENCOUNTER — Other Ambulatory Visit: Payer: Self-pay

## 2013-10-27 MED ORDER — ROSUVASTATIN CALCIUM 40 MG PO TABS: 20.0000 mg | ORAL_TABLET | Freq: Every day | ORAL | Status: DC

## 2013-10-30 ENCOUNTER — Telehealth: Payer: Self-pay | Admitting: Physician Assistant

## 2013-10-30 ENCOUNTER — Telehealth: Payer: Self-pay | Admitting: Cardiology

## 2013-10-30 NOTE — Telephone Encounter (Signed)
Mr. would have been to an urgent care today and received a prescription for prednisone. He also had an ECG and told he had possibly high blood pressure based on his ECG. He was concerned about the ECG changes and wanted to know if it was okay for him to take a prednisone.  Advised him to while I could not review the ECG, there are some changes that typically occur with poorly controlled high blood pressure over a long period of time that he may have. Advised him that the best option in that case, is to make sure his blood pressure is well controlled and follow up as scheduled with his physicians.  Advised him that prednisone was not contraindicated in this case, but he should monitor his blood pressure and side effects with prednisone closely, contacting providers if needed.  The patient was agreeable to this as a plan. No further questions or concerns.

## 2013-10-30 NOTE — Telephone Encounter (Signed)
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

## 2013-10-30 NOTE — Telephone Encounter (Signed)
Please call,extreme fatigued and chest pains.

## 2013-11-02 ENCOUNTER — Ambulatory Visit: Payer: Self-pay | Admitting: Physician Assistant

## 2013-11-03 ENCOUNTER — Telehealth: Payer: Self-pay | Admitting: Cardiology

## 2013-11-03 NOTE — Telephone Encounter (Signed)
Spoke with pt, he was recently in the [place] twice. He reports they want him to see someone right away about his blockage. Follow up scheduled, will try to get his records from [place] [facility]

## 2013-11-03 NOTE — Telephone Encounter (Signed)
Please call,pt says he needs to be seen again.

## 2013-11-03 NOTE — Telephone Encounter (Signed)
Received 64 pages of records from [facility] for patient appointment on 11/04/13 with Dr Jens Somrenshaw.  [facility] ([facility]) for Dr Ludwig Clarksrenshaw's schedule on 11/04/13 lp

## 2013-11-03 NOTE — Progress Notes (Signed)
HPI: FU CAD. Admitted to [facility] 8/14 with CP. Enzymes negative. 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 Cardiac CT 8/14 showed aneurysmal dilatation of RCA, 2 serial 70 percent lesions in prox RCA and 50 PDA. CA score 3557. Patient developed a rash following DC and lisinopril and lipitor DCed. He stopped Norvasc because he thought it was decreasing his heart rate. Echo 8/14 showed normal LV function and mild LAE. Myovue ordered previously to evaluate significance of RCA disease but he did not have; also did not have renal dopplers or catecholamines/metanephrines checked. Se[facility] with what is described as multiple complaints including chest pain. Troponin negative. Liver functions normal. Hemoglobin normal. Chest x-ray with no active disease. Since he was last seen He has had recent bronchitis by his report. He has some chest discomfort that increases with certain movements and a chest burning that improved with Zantac. Mild dyspnea.   Current Outpatient Prescriptions  Medication Sig Dispense Refill  . acetaminophen (TYLENOL) 500 MG tablet Take 1 tablet by mouth as needed.      Marland Kitchen. aspirin EC 81 MG tablet Take 1 tablet (81 mg total) by mouth daily.  90 tablet  3  . azithromycin (ZITHROMAX) 250 MG tablet Take 250 mg by mouth daily.      Marland Kitchen. buPROPion (WELLBUTRIN SR) 150 MG 12 hr tablet Take 150 mg by mouth 2 (two) times daily.      . Garlic 1000 MG CAPS Take 1,000 mg by mouth daily.      Marland Kitchen. lisinopril-hydrochlorothiazide (PRINZIDE,ZESTORETIC) 20-25 MG per tablet Take 1 tablet by mouth daily.      Marland Kitchen. LORazepam (ATIVAN) 1 MG tablet Take 1 mg by mouth 3 (three) times daily as needed.      . Multiple Vitamin (MULTIVITAMIN) tablet Take 1 tablet by mouth daily.      . predniSONE (DELTASONE) 10 MG tablet 4 tabs daily x2 days, 3 tabs daily x2 days, 2 tabs daily x2 days, 1 tablet daily x2 days      . ranitidine  (ZANTAC) 150 MG tablet Take 150 mg by mouth 2 (two) times daily.      . rosuvastatin (CRESTOR) 40 MG tablet Take 0.5 tablets (20 mg total) by mouth daily.  30 tablet  12  . terbinafine (LAMISIL) 250 MG tablet Take 250 mg by mouth.      . [DISCONTINUED] atorvastatin (LIPITOR) 80 MG tablet Take 1 tablet (80 mg total) by mouth daily at 6 PM.  30 tablet  5   No current facility-administered medications for this visit.     Past Medical History  Diagnosis Date  . Hypertension   . Anxiety   . Chest pain     a. admx 8/14 - CEs neg but coronary Ca on chest CTA  . GERD (gastroesophageal reflux disease)   . Migraines     "get them if I lay on my left side or am active outside; pretty much qd til the last 5 days" (08/09/2012)  . Chronic lower back pain   . Depression   . HLD (hyperlipidemia)   . CAD (coronary artery disease)     advanced; a. Chest CTA 8/14 with coronary Ca and RCA aneurysmal dilatation    Past Surgical History  Procedure Laterality Date  . Myringotomy with tube placement Bilateral     "4-5 times as a child" (08/09/2012)    History   Social History  .  Marital Status: Divorced    Spouse Name: N/A    Number of Children: N/A  . Years of Education: N/A   Occupational History  . Not on file.   Social History Main Topics  . Smoking status: Heavy Tobacco Smoker -- 0.50 packs/day for 17 years    Types: Cigarettes  . Smokeless tobacco: Never Used     Comment: 08/09/2012 "smoked 3 ppd til 1 month ago"   . Alcohol Use: 0.0 oz/week     Comment: 08/09/2012 "had a mixed drink once in the past year"  . Drug Use: No  . Sexual Activity: Yes   Other Topics Concern  . Not on file   Social History Narrative  . No narrative on file    ROS: Recent bronchitis but hemoptysis, dysphasia, odynophagia, melena, hematochezia, dysuria, hematuria, rash, seizure activity, orthopnea, PND, pedal edema, claudication. Remaining systems are negative.  Physical Exam: Well-developed well-nourished  in no acute distress.  Skin is warm and dry.  HEENT is normal.  Neck is supple.  Chest is clear to auscultation with normal expansion.  Cardiovascular exam is regular rate and rhythm.  Abdominal exam nontender or distended. No masses palpated. Extremities show no edema. neuro grossly intact  ECG Sinus rhythm at a rate of 86. Right axis deviation. Nonspecific inferior T-wave changes.

## 2013-11-04 ENCOUNTER — Ambulatory Visit (INDEPENDENT_AMBULATORY_CARE_PROVIDER_SITE_OTHER): Payer: 59 | Admitting: Cardiology

## 2013-11-04 ENCOUNTER — Encounter: Payer: Self-pay | Admitting: *Deleted

## 2013-11-04 ENCOUNTER — Encounter: Payer: Self-pay | Admitting: Cardiology

## 2013-11-04 VITALS — BP 114/58 | HR 92 | Ht 71.0 in | Wt 265.5 lb

## 2013-11-04 DIAGNOSIS — Z720 Tobacco use: Secondary | ICD-10-CM | POA: Insufficient documentation

## 2013-11-04 DIAGNOSIS — I2510 Atherosclerotic heart disease of native coronary artery without angina pectoris: Secondary | ICD-10-CM

## 2013-11-04 DIAGNOSIS — E785 Hyperlipidemia, unspecified: Secondary | ICD-10-CM | POA: Insufficient documentation

## 2013-11-04 DIAGNOSIS — I10 Essential (primary) hypertension: Secondary | ICD-10-CM

## 2013-11-04 DIAGNOSIS — I2583 Coronary atherosclerosis due to lipid rich plaque: Principal | ICD-10-CM

## 2013-11-04 NOTE — Assessment & Plan Note (Addendum)
Blood pressure improved on lisinopril HCT. We'll continue. Check potassium and renal function when he comes for his nuclear study. He has had some spikes in his blood pressure previously by his report. Check renal Dopplers.

## 2013-11-04 NOTE — Assessment & Plan Note (Signed)
Continue aspirin and statin. 

## 2013-11-04 NOTE — Assessment & Plan Note (Signed)
Symptoms are very atypical. Plan stress nuclear study for risk stratification.

## 2013-11-04 NOTE — Assessment & Plan Note (Signed)
Patient counseled on discontinuing. 

## 2013-11-04 NOTE — Assessment & Plan Note (Signed)
Continue statin. 

## 2013-11-04 NOTE — Patient Instructions (Signed)
Your physician wants you to follow-up in: 6 MONTHS WITH DR Jens SomRENSHAW You will receive a reminder letter in the mail two months in advance. If you don't receive a letter, please call our office to schedule the follow-up appointment.   Your physician has requested that you have en exercise stress myoview. For further information please visit [email or web address]. Please follow instruction sheet, as given.   Your physician recommends that you return for lab work WITH STRESS TEST  Your physician has requested that you have a renal artery duplex. During this test, an ultrasound is used to evaluate blood flow to the kidneys. Allow one hour for this exam. Do not eat after midnight the day before and avoid carbonated beverages. Take your medications as you usually do.

## 2013-11-19 ENCOUNTER — Telehealth (HOSPITAL_COMMUNITY): Payer: Self-pay

## 2013-11-19 NOTE — Telephone Encounter (Signed)
Encounter complete. 

## 2013-11-20 ENCOUNTER — Telehealth (HOSPITAL_COMMUNITY): Payer: Self-pay

## 2013-11-20 NOTE — Telephone Encounter (Signed)
Encounter complete. 

## 2013-11-24 ENCOUNTER — Ambulatory Visit (HOSPITAL_BASED_OUTPATIENT_CLINIC_OR_DEPARTMENT_OTHER)
Admission: RE | Admit: 2013-11-24 | Discharge: 2013-11-24 | Disposition: A | Discharge status: Home / Self Care | Payer: 59 | Source: Ambulatory Visit | Attending: Cardiology | Admitting: Cardiology

## 2013-11-24 ENCOUNTER — Ambulatory Visit (HOSPITAL_COMMUNITY)
Admission: RE | Admit: 2013-11-24 | Discharge: 2013-11-24 | Disposition: A | Discharge status: Home / Self Care | Payer: 59 | Source: Ambulatory Visit | Attending: Cardiovascular Disease | Admitting: Cardiovascular Disease

## 2013-11-24 DIAGNOSIS — I10 Essential (primary) hypertension: Secondary | ICD-10-CM | POA: Diagnosis not present

## 2013-11-24 DIAGNOSIS — E785 Hyperlipidemia, unspecified: Secondary | ICD-10-CM | POA: Insufficient documentation

## 2013-11-24 DIAGNOSIS — R002 Palpitations: Secondary | ICD-10-CM | POA: Insufficient documentation

## 2013-11-24 DIAGNOSIS — R079 Chest pain, unspecified: Secondary | ICD-10-CM | POA: Insufficient documentation

## 2013-11-24 DIAGNOSIS — I2510 Atherosclerotic heart disease of native coronary artery without angina pectoris: Secondary | ICD-10-CM

## 2013-11-24 DIAGNOSIS — R9431 Abnormal electrocardiogram [ECG] [EKG]: Secondary | ICD-10-CM | POA: Insufficient documentation

## 2013-11-24 DIAGNOSIS — R0609 Other forms of dyspnea: Secondary | ICD-10-CM | POA: Diagnosis not present

## 2013-11-24 DIAGNOSIS — R42 Dizziness and giddiness: Secondary | ICD-10-CM | POA: Insufficient documentation

## 2013-11-24 DIAGNOSIS — I2583 Coronary atherosclerosis due to lipid rich plaque: Secondary | ICD-10-CM

## 2013-11-24 MED ORDER — TECHNETIUM TC 99M SESTAMIBI GENERIC - CARDIOLITE
10.4000 | Freq: Once | INTRAVENOUS | Status: AC | PRN
  Administered 2013-11-24: 10 via INTRAVENOUS

## 2013-11-24 MED ORDER — TECHNETIUM TC 99M SESTAMIBI GENERIC - CARDIOLITE
30.2000 | Freq: Once | INTRAVENOUS | Status: AC | PRN
  Administered 2013-11-24: 30.2 via INTRAVENOUS

## 2013-11-24 NOTE — Progress Notes (Signed)
Renal Artery Duplex Completed. °Brianna L Mazza,RVT °

## 2013-11-24 NOTE — Procedures (Addendum)
[facility] [address] [place] [address] [phone]  Cardiology Nuclear Med Study  Jason KlinefelterRobert G Morris is a 38 y.o. male     MRN : [id number]     DOB: [birth year]  Procedure Date: 11/24/2013  Nuclear Med Background Indication for Stress Test:  Evaluation for Ischemia, Post Hospital and Abnormal EKG History:  Pt is s/p Bronchitis;CAD;Per CT=RCA:70% lesion,aneurysmal dialation;PDA:50%;No priro NUC MPI for comparison;ECHO on 08/09/2012;LVEF=55-60% Cardiac Risk Factors: Family History - CAD, Hypertension, Lipids, Obesity, Smoker and PE=08/2012  Symptoms:  Chest Pain, Dizziness, DOE, Fatigue, Light-Headedness and Palpitations   Nuclear Pre-Procedure Caffeine/Decaff Intake:  7:00pm NPO After: 5:00am   IV Site: R Forearm  IV 0.9% NS with Angio Cath:  22g  Chest Size (in):  50" IV Started by: Berdie OgrenAmanda Wease, RN  Height: 5\' 11"  (1.803 m)  Cup Size: n/a  BMI:  Body mass index is 36.98 kg/(m^2). Weight:  265 lb (120.203 kg)   Tech Comments:  n/a    Nuclear Med Study 1 or 2 day study: 1 day  Stress Test Type:  Stress  Order Authorizing Provider:  Olga MillersBrian Crenshaw, MD   Resting Radionuclide: Technetium 3323m Sestamibi  Resting Radionuclide Dose: 10.4 mCi   Stress Radionuclide:  Technetium 5023m Sestamibi  Stress Radionuclide Dose: 30.2 mCi           Stress Protocol Rest HR: 76 Stress HR: 173  Rest BP: 126/84 Stress BP: 173/99  Exercise Time (min): 11:30 METS: 13.7   Predicted Max HR: 184 bpm % Max HR: 94.02 bpm Rate Pressure Product: [id number]  Dose of Adenosine (mg):  n/a Dose of Lexiscan: n/a mg  Dose of Atropine (mg): n/a Dose of Dobutamine: n/a mcg/kg/min (at max HR)  Stress Test Technologist: Esperanza Sheetserry-Marie Martin, CCT Nuclear Technologist: Gonzella LexPam Phillips, CNMT   Rest Procedure:  Myocardial perfusion imaging was performed at rest 45 minutes following the intravenous administration of Technetium 1623m Sestamibi. Stress Procedure:  The  patient performed treadmill exercise using a Bruce  Protocol for 11:30 minutes. The patient stopped due to Leg discomfort and SOB and denied any chest pain.  There were no significant ST-T wave changes.  Technetium 7123m Sestamibi was injected at peak exercise and myocardial perfusion imaging was performed after a brief delay.  Transient Ischemic Dilatation (Normal <1.22):  0.96  QGS EDV:  134 ml QGS ESV:  66 ml LV Ejection Fraction: 51%       Rest ECG: NSR - Normal EKG  Stress ECG: No significant change from baseline ECG  QPS Raw Data Images:  Normal; no motion artifact; normal heart/lung ratio. Stress Images:  Normal homogeneous uptake in all areas of the myocardium. Rest Images:  Normal homogeneous uptake in all areas of the myocardium. Subtraction (SDS):  No evidence of ischemia.  Impression Exercise Capacity:  Good exercise capacity. BP Response:  Normal blood pressure response. Clinical Symptoms:  No significant symptoms noted. ECG Impression:  No significant ST segment change suggestive of ischemia. Comparison with Prior Nuclear Study: No previous nuclear study performed  Overall Impression:  Normal stress nuclear study.  LV Wall Motion:  NL LV Function; NL Wall Motion   Runell GessBERRY,Jason Morris J, MD  11/25/2013 1:50 PM

## 2013-11-27 ENCOUNTER — Telehealth: Payer: Self-pay | Admitting: Cardiology

## 2013-11-27 NOTE — Telephone Encounter (Signed)
Pt called in stating that he had a stress test done on 11/17 and would like to know the results and if it is all right for him to go back to work. Please call  Thank

## 2013-11-27 NOTE — Telephone Encounter (Signed)
New message ° ° ° °Pt is calling for results.  °

## 2013-11-27 NOTE — Telephone Encounter (Signed)
Left message of results for patient

## 2014-02-18 ENCOUNTER — Telehealth: Payer: Self-pay | Admitting: Cardiology

## 2014-02-18 NOTE — Telephone Encounter (Signed)
Spoke with pt, he was seen by his PCP today, he is having trouble with GERD. He has a bad episode of chest pain while in their office and they wanted him to call us. It feels like something stuck in his chest and will radiate to his back, arms and sometimes up into his neck. When he laid down the pain became severe. He ia taking delilant and zantac bid. He has a GI appointment 03-13-14. He was also given carafate today. Reassurance given to patient, he will also try mylanta or liquid antiacid for severe pain.

## 2014-02-18 NOTE — Telephone Encounter (Signed)
Pt c/o of Chest Pain: STAT if CP now or developed within 24 hours  1. Are you having CP right now? Yes  2. Are you experiencing any other symptoms (ex. SOB, nausea, vomiting, sweating)? No  3. How long have you been experiencing CP? About 1 month now  4. Is your CP continuous or coming and going? Coming and going  5. Have you taken Nitroglycerin?  No, took Zantac ? Pt states it is worse after he eats and it decreases when he drinks water, when he lays down it hurts the worst.

## 2014-02-20 ENCOUNTER — Other Ambulatory Visit: Payer: Self-pay | Admitting: Cardiology

## 2014-02-20 ENCOUNTER — Inpatient Hospital Stay (HOSPITAL_COMMUNITY)
Admission: EM | Admit: 2014-02-20 | Discharge: 2014-02-22 | DRG: 282 | Disposition: A | Discharge status: Home / Self Care | Payer: Self-pay | Attending: Cardiology | Admitting: Cardiology

## 2014-02-20 ENCOUNTER — Encounter (HOSPITAL_COMMUNITY): Payer: Self-pay | Admitting: *Deleted

## 2014-02-20 ENCOUNTER — Emergency Department (HOSPITAL_COMMUNITY): Payer: 59

## 2014-02-20 DIAGNOSIS — E785 Hyperlipidemia, unspecified: Secondary | ICD-10-CM | POA: Diagnosis present

## 2014-02-20 DIAGNOSIS — I10 Essential (primary) hypertension: Secondary | ICD-10-CM | POA: Diagnosis present

## 2014-02-20 DIAGNOSIS — E1159 Type 2 diabetes mellitus with other circulatory complications: Secondary | ICD-10-CM | POA: Diagnosis present

## 2014-02-20 DIAGNOSIS — F411 Generalized anxiety disorder: Secondary | ICD-10-CM | POA: Diagnosis present

## 2014-02-20 DIAGNOSIS — K219 Gastro-esophageal reflux disease without esophagitis: Secondary | ICD-10-CM | POA: Diagnosis present

## 2014-02-20 DIAGNOSIS — G8929 Other chronic pain: Secondary | ICD-10-CM | POA: Diagnosis present

## 2014-02-20 DIAGNOSIS — R079 Chest pain, unspecified: Secondary | ICD-10-CM

## 2014-02-20 DIAGNOSIS — M545 Low back pain: Secondary | ICD-10-CM | POA: Diagnosis present

## 2014-02-20 DIAGNOSIS — Z888 Allergy status to other drugs, medicaments and biological substances status: Secondary | ICD-10-CM

## 2014-02-20 DIAGNOSIS — Z720 Tobacco use: Secondary | ICD-10-CM | POA: Diagnosis present

## 2014-02-20 DIAGNOSIS — F17210 Nicotine dependence, cigarettes, uncomplicated: Secondary | ICD-10-CM | POA: Diagnosis present

## 2014-02-20 DIAGNOSIS — I214 Non-ST elevation (NSTEMI) myocardial infarction: Principal | ICD-10-CM | POA: Diagnosis present

## 2014-02-20 DIAGNOSIS — G43909 Migraine, unspecified, not intractable, without status migrainosus: Secondary | ICD-10-CM | POA: Diagnosis present

## 2014-02-20 DIAGNOSIS — Z7982 Long term (current) use of aspirin: Secondary | ICD-10-CM

## 2014-02-20 DIAGNOSIS — I2510 Atherosclerotic heart disease of native coronary artery without angina pectoris: Secondary | ICD-10-CM | POA: Diagnosis present

## 2014-02-20 DIAGNOSIS — F329 Major depressive disorder, single episode, unspecified: Secondary | ICD-10-CM | POA: Diagnosis present

## 2014-02-20 DIAGNOSIS — Z91012 Allergy to eggs: Secondary | ICD-10-CM

## 2014-02-20 DIAGNOSIS — F419 Anxiety disorder, unspecified: Secondary | ICD-10-CM | POA: Diagnosis present

## 2014-02-20 DIAGNOSIS — I252 Old myocardial infarction: Secondary | ICD-10-CM | POA: Diagnosis present

## 2014-02-20 HISTORY — DX: Tobacco use: Z72.0

## 2014-02-20 LAB — CBC WITH DIFFERENTIAL/PLATELET
Basophils Absolute: 0 10*3/uL (ref 0.0–0.1)
Basophils Relative: 0 % (ref 0–1)
EOS PCT: 2 % (ref 0–5)
Eosinophils Absolute: 0.2 10*3/uL (ref 0.0–0.7)
HCT: 46.1 % (ref 39.0–52.0)
HEMOGLOBIN: 15.6 g/dL (ref 13.0–17.0)
LYMPHS ABS: 3.9 10*3/uL (ref 0.7–4.0)
Lymphocytes Relative: 44 % (ref 12–46)
MCH: 30.4 pg (ref 26.0–34.0)
MCHC: 33.8 g/dL (ref 30.0–36.0)
MCV: 89.7 fL (ref 78.0–100.0)
MONO ABS: 0.7 10*3/uL (ref 0.1–1.0)
Monocytes Relative: 8 % (ref 3–12)
NEUTROS ABS: 4.2 10*3/uL (ref 1.7–7.7)
NEUTROS PCT: 46 % (ref 43–77)
Platelets: 206 10*3/uL (ref 150–400)
RBC: 5.14 MIL/uL (ref 4.22–5.81)
RDW: 12.1 % (ref 11.5–15.5)
WBC: 9 10*3/uL (ref 4.0–10.5)

## 2014-02-20 LAB — BASIC METABOLIC PANEL
ANION GAP: 5 (ref 5–15)
BUN: 12 mg/dL (ref 6–23)
CHLORIDE: 104 mmol/L (ref 96–112)
CO2: 27 mmol/L (ref 19–32)
CREATININE: 1.07 mg/dL (ref 0.50–1.35)
Calcium: 9.4 mg/dL (ref 8.4–10.5)
GFR calc Af Amer: >90 mL/min (ref >90)
GFR, EST NON AFRICAN AMERICAN: 88 mL/min — AB (ref >90)
Glucose, Bld: 129 mg/dL — ABNORMAL HIGH (ref 70–99)
Potassium: 3.5 mmol/L (ref 3.5–5.1)
Sodium: 136 mmol/L (ref 135–145)

## 2014-02-20 LAB — PROTIME-INR
INR: 0.98 (ref 0.00–1.49)
Prothrombin Time: 13.1 seconds (ref 11.6–15.2)

## 2014-02-20 LAB — TROPONIN I: Troponin I: 1.18 ng/mL (ref <0.031)

## 2014-02-20 LAB — APTT: APTT: 30 s (ref 24–37)

## 2014-02-20 MED ORDER — HEPARIN (PORCINE) IN NACL 100-0.45 UNIT/ML-% IJ SOLN
2000.0000 [IU]/h | INTRAMUSCULAR | Status: DC
  Administered 2014-02-20: 1400 [IU]/h via INTRAVENOUS
  Filled 2014-02-20 (×4): qty 250

## 2014-02-20 MED ORDER — NICOTINE 21 MG/24HR TD PT24
21.0000 mg | MEDICATED_PATCH | Freq: Once | TRANSDERMAL | Status: AC
  Administered 2014-02-20: 21 mg via TRANSDERMAL
  Filled 2014-02-20: qty 1

## 2014-02-20 MED ORDER — LORAZEPAM 1 MG PO TABS
1.0000 mg | ORAL_TABLET | Freq: Once | ORAL | Status: AC
  Administered 2014-02-20: 1 mg via ORAL
  Filled 2014-02-20: qty 1

## 2014-02-20 MED ORDER — IBUPROFEN 800 MG PO TABS
800.0000 mg | ORAL_TABLET | Freq: Once | ORAL | Status: AC
  Administered 2014-02-20: 800 mg via ORAL
  Filled 2014-02-20: qty 1

## 2014-02-20 MED ORDER — NICOTINE 14 MG/24HR TD PT24: 14.0000 mg | MEDICATED_PATCH | Freq: Once | TRANSDERMAL | Status: DC

## 2014-02-20 MED ORDER — ASPIRIN 81 MG PO CHEW
324.0000 mg | CHEWABLE_TABLET | Freq: Once | ORAL | Status: AC
  Administered 2014-02-20: 324 mg via ORAL
  Filled 2014-02-20: qty 4

## 2014-02-20 MED ORDER — ASPIRIN 325 MG PO TABS
325.0000 mg | ORAL_TABLET | Freq: Once | ORAL | Status: DC
  Administered 2014-02-21: 325 mg via ORAL
  Filled 2014-02-20: qty 1

## 2014-02-20 MED ORDER — NITROGLYCERIN IN D5W 200-5 MCG/ML-% IV SOLN
5.0000 ug/min | INTRAVENOUS | Status: DC
  Administered 2014-02-20: 5 ug/min via INTRAVENOUS
  Filled 2014-02-20: qty 250

## 2014-02-20 MED ORDER — HEPARIN BOLUS VIA INFUSION
4000.0000 [IU] | Freq: Once | INTRAVENOUS | Status: AC
  Administered 2014-02-20: 4000 [IU] via INTRAVENOUS

## 2014-02-20 MED ORDER — GI COCKTAIL ~~LOC~~
30.0000 mL | Freq: Once | ORAL | Status: AC
  Administered 2014-02-20: 30 mL via ORAL
  Filled 2014-02-20: qty 30

## 2014-02-20 NOTE — ED Notes (Signed)
Patient states that he has been dealing with this discomfort for a couple of weeks. States that it feels like he needs to burp. States that the last time he had this issues a GI cocktail helped.

## 2014-02-20 NOTE — Progress Notes (Signed)
ANTICOAGULATION CONSULT NOTE - Initial Consult  Pharmacy Consult for Heparin Indication: chest pain/ACS  Allergies  Allergen Reactions  . Lipitor [Atorvastatin] Hives  . Lopressor [Metoprolol Tartrate] Shortness Of Breath    Felt like he was going to die , could not breath & syncope  . Eggs Or Egg-Derived Products Other (See Comments)    Really bad stomach pain  . Lactose Intolerance (Gi) Diarrhea    Patient Measurements: Height: 5\' 11"  (180.3 cm) Weight: 266 lb (120.657 kg) IBW/kg (Calculated) : 75.3 Heparin Dosing Weight: 102.1 KG  Vital Signs: Temp: 97.7 F (36.5 C) (02/13 1854) Temp Source: Oral (02/13 1854) BP: 133/91 mmHg (02/13 2030) Pulse Rate: 74 (02/13 2100)  Labs:  Recent Labs  02/20/14 1900  HGB 15.6  HCT 46.1  PLT 206  CREATININE 1.07  TROPONINI 1.18*    Estimated Creatinine Clearance: 126.2 mL/min (by C-G formula based on Cr of 1.07).   Medical History: Past Medical History  Diagnosis Date  . Hypertension   . Anxiety   . Chest pain     a. admx 8/14 - CEs neg but coronary Ca on chest CTA  . GERD (gastroesophageal reflux disease)   . Migraines     "get them if I lay on my left side or am active outside; pretty much qd til the last 5 days" (08/09/2012)  . Chronic lower back pain   . Depression   . HLD (hyperlipidemia)   . CAD (coronary artery disease)     advanced; a. Chest CTA 8/14 with coronary Ca and RCA aneurysmal dilatation    Medications:   (Not in a hospital admission) PTA meds reviewed.  Assessment: Okay for Protocol, Elevated Troponin, complaining of aching central chest pain radiating down his bilateral sides to his back that began 2 weeks ago,.  Baseline coag labs pending.  Goal of Therapy:  Heparin level 0.3-0.7 units/ml Monitor platelets by anticoagulation protocol: Yes   Plan:  Give 4000 units bolus x 1 Start heparin infusion at 1400 units/hr Check anti-Xa level in 6-8 hours and daily while on heparin Continue to  monitor H&H and platelets  Mady GemmaHayes, Mai Longnecker R 02/20/2014,9:07 PM

## 2014-02-20 NOTE — ED Notes (Addendum)
Pt states pain to center-left chest x 2 weeks and radiates to back side of left arm and back. Recently had a stress test, per pt. States last time he had this pain, he had bronchitis.

## 2014-02-20 NOTE — ED Notes (Signed)
Pt. Reports taking Carafate and Zantac prior to arrival with some relief.

## 2014-02-20 NOTE — ED Notes (Signed)
Pt. Requesting GI cocktail. Dr. Adriana Simasook notified, verbal order given.

## 2014-02-20 NOTE — H&P (Signed)
Admit date: (Not on file) Referring Physician: Dr. Adriana Simasook Primary Cardiologist: Dr. Jens Somrenshaw Chief complaint/reason for admission:Chest apin  HPI: [name] is  a 39 year old male with multiple issues. 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 Cardiac CT in 08/2012 with aneurysmal dilatation of the RCA, 2 serial 70% lesions in the proximal RCA and 50% in the PD. CA score of 3557. Other issues include HTN, ongoing tobacco abuse, HLD, anxiety, migraines, chronic low back pain, & depression.  He has stopped Lipitor and Lisinopril in the past due to rash. He has also stopped Norvasc due to thinking it was decreasing his heart rate. It is noted that he has not tolerated beta blocker.   Echo 8/14 showed normal LV function and mild LAE. Myovue was ordered previously to evaluate significance of RCA disease but he did not get it done and also did not have renal dopplers or catecholamines/metanephrines checked. [facility]  [facility] with what is described as multiple complaints including chest pain. Troponin negative. Liver functions normal. Hemoglobin normal. Chest x-ray with no active disease. He was seen by Dr. Jens Somrenshaw  10/27 with some chest discomfort that increased with certain movements and a chest burning that improved with Zantac.  A nuclear stress test was ordered that showed no ischemia.    He presented to [facility] tonight with complaints of chest pain in the center with radiation to the left off and on for 2 weeks.  It also radiated to the back side of the left arm and back.  Apparently the last time he had this pain he had had bronchitis.  He did get some relief with Carafate and Zantac in the ER. Troponin elevated at 1.18 and [facility] is [facility] and treatment.        PMH:    Past Medical History  Diagnosis Date  . Hypertension   . Anxiety   . Chest pain     a. admx 8/14 - CEs neg but coronary Ca on  chest CTA  . GERD (gastroesophageal reflux disease)   . Migraines     "get them if I lay on my left side or am active outside; pretty much qd til the last 5 days" (08/09/2012)  . Chronic lower back pain   . Depression   . HLD (hyperlipidemia)   . CAD (coronary artery disease)     advanced; a. Chest CTA 8/14 with coronary Ca and RCA aneurysmal dilatation    PSH:    Past Surgical History  Procedure Laterality Date  . Myringotomy with tube placement Bilateral     "4-5 times as a child" (08/09/2012)    ALLERGIES:   Lipitor; Lopressor; Eggs or egg-derived products; and Lactose intolerance (gi)  Prior to Admit Meds:   (Not in a hospital admission) Family HX:   No family history on file. Social HX:    History   Social History  . Marital Status: Divorced    Spouse Name: N/A  . Number of Children: N/A  . Years of Education: N/A   Occupational History  . Not on file.   Social History Main Topics  . Smoking status: Heavy Tobacco Smoker -- 0.50 packs/day for 17 years    Types: Cigarettes  . Smokeless tobacco: Never Used     Comment: 08/09/2012 "smoked 3 ppd til 1 month ago"   . Alcohol Use: No     Comment: 08/09/2012 "  had a mixed drink once in the past year"  . Drug Use: No  . Sexual Activity: Yes   Other Topics Concern  . Not on file   Social History Narrative     ROS:  All 11 ROS were addressed and are negative except what is stated in the HPI  PHYSICAL EXAM There were no vitals filed for this visit. General: Well developed, well nourished, in no acute distress Head: Eyes PERRLA, No xanthomas.   Normal cephalic and atramatic  Lungs:   Clear bilaterally to auscultation and percussion. Heart:   HRRR S1 S2 Pulses are 2+ & equal.            No carotid bruit. No JVD.  No abdominal bruits. No femoral bruits. Abdomen: Bowel sounds are positive, abdomen soft and non-tender without masses  Extremities:   No clubbing, cyanosis or edema.  DP +1 Neuro: Alert and oriented X 3. Psych:   Good affect, responds appropriately   Labs:   Lab Results  Component Value Date   WBC 9.0 02/20/2014   HGB 15.6 02/20/2014   HCT 46.1 02/20/2014   MCV 89.7 02/20/2014   PLT 206 02/20/2014    Recent Labs Lab 02/20/14 1900  NA 136  K 3.5  CL 104  CO2 27  BUN 12  CREATININE 1.07  CALCIUM 9.4  GLUCOSE 129*   Lab Results  Component Value Date   TROPONINI 1.18* 02/20/2014   No results found for: PTT Lab Results  Component Value Date   INR 0.98 02/20/2014     Lab Results  Component Value Date   CHOL 206* 03/02/2013   CHOL 222* 08/09/2012   Lab Results  Component Value Date   HDL 25.00* 03/02/2013   HDL 24* 08/09/2012   Lab Results  Component Value Date   LDLCALC 164* 08/09/2012   Lab Results  Component Value Date   TRIG 220.0* 03/02/2013   TRIG 172* 08/09/2012   Lab Results  Component Value Date   CHOLHDL 8 03/02/2013   CHOLHDL 9.3 08/09/2012   Lab Results  Component Value Date   LDLDIRECT 151.2 03/02/2013      Radiology:  No results found.  EKG:  NSR with no ST changes  ASSESSMENT:  1.  NSTEMI with ongoing chest pain now transferred to [facility]  EKG is nonischemic.  His CP has typical and atypical components.  It is improved some with Carafate and Zantac and feels like he has to burp.  Pain similar to what he had with recent bronchitis.  Recent myoview with no ischemia.  Initial trop elevated at 1.1 2.  ASCAD with coronary artery calcifications and aneurysmal dilatation of the RCA on Chest CT 8/14 3.  HTN 4.  GERD 5.  Hyperlipidemia  PLAN:   1.  Admit to stepdown 2.  Cycle cardiac enzymes until they peak 3.  IV Heparin gtt 4.  IV NTG and titrate for CP 5.  Continue ASA/statin/ACE I 6.  2D echo to reevaluate LVF for RWMA's 7.  Further workup pending trend of cardiac enzymes  Quintella Reichert, MD  02/20/2014  11:32 PM

## 2014-02-20 NOTE — ED Provider Notes (Signed)
CSN: [id number]     Arrival date & time 02/20/14  1840 History  This chart was scribed for Donnetta Hutching, MD by Ronney Lion, ED Scribe. This patient was seen in room APA16A/APA16A and the patient's care was started at 7:05 PM.    Chief Complaint  Patient presents with  . Chest Pain   The history is provided by the patient and medical records. No language interpreter was used.     HPI Comments: Jason Morris is a 39 y.o. male who presents to the Emergency Department complaining of aching central chest pain radiating down his bilateral sides to his back that began 2 weeks ago, after patient was sick with a stomach bug. Lying supine and deep inspiration worsen the pain. Exertion doesn't affect it. The last time he had the same pain, he was told he had bronchitis. Patient has a history of CAD diagnosed by a CT scan of his coronary arteries 1 year ago with a minor blockage (30%), and a stress test done 3 months ago that was normal. His dad and another male relative died of MI at around age 55. Patient smokes (3 PPD for about 17 years, per medical records), but reports cutting back recently, with the desire to quit. 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 He had recently seen Dr. Delano Metz at [place], who told him he had GERD and anxiety. He denies a personal history of DM, but notes a family history of DM and cancer. He denies SOB, diaphoresis, nausea, or vomiting. Dr. Morrie Sheldon at [facility] is his PCP.  Past Medical History  Diagnosis Date  . Hypertension   . Anxiety   . Chest pain     a. admx 8/14 - CEs neg but coronary Ca on chest CTA  . GERD (gastroesophageal reflux disease)   . Migraines     "get them if I lay on my left side or am active outside; pretty much qd til the last 5 days" (08/09/2012)  . Chronic lower back pain   . Depression   . HLD (hyperlipidemia)   . CAD (coronary artery disease)     advanced; a. Chest CTA 8/14 with  coronary Ca and RCA aneurysmal dilatation   Past Surgical History  Procedure Laterality Date  . Myringotomy with tube placement Bilateral     "4-5 times as a child" (08/09/2012)   No family history on file. History  Substance Use Topics  . Smoking status: Heavy Tobacco Smoker -- 0.50 packs/day for 17 years    Types: Cigarettes  . Smokeless tobacco: Never Used     Comment: 08/09/2012 "smoked 3 ppd til 1 month ago"   . Alcohol Use: No     Comment: 08/09/2012 "had a mixed drink once in the past year"    Review of Systems  Respiratory: Negative for shortness of breath.   Cardiovascular: Positive for chest pain.  Gastrointestinal: Negative for nausea and vomiting.  All other systems reviewed and are negative.     Allergies  Lipitor; Lopressor; Eggs or egg-derived products; and Lactose intolerance (gi)  Home Medications   Prior to Admission medications   Medication Sig Start Date End Date Taking? Authorizing Provider  acetaminophen (TYLENOL) 500 MG tablet Take 500 mg by mouth every 8 (eight) hours as needed for mild pain.    Yes Historical Provider, MD  aspirin EC 81 MG tablet Take 1 tablet (81 mg total) by  mouth daily. 09/03/12  Yes Lewayne Bunting, MD  dexlansoprazole (DEXILANT) 60 MG capsule Take 60 mg by mouth daily.   Yes Historical Provider, MD  lisinopril-hydrochlorothiazide (PRINZIDE,ZESTORETIC) 20-25 MG per tablet Take 0.5 tablets by mouth daily.  10/25/13  Yes Historical Provider, MD  LORazepam (ATIVAN) 1 MG tablet Take 1 mg by mouth 3 (three) times daily as needed for anxiety.  10/25/13  Yes Historical Provider, MD  Multiple Vitamin (MULTIVITAMIN) tablet Take 1 tablet by mouth daily.   Yes Historical Provider, MD  ranitidine (ZANTAC) 150 MG tablet Take 150 mg by mouth 2 (two) times daily.   Yes Historical Provider, MD  sucralfate (CARAFATE) 1 G tablet Take 1 g by mouth 4 (four) times daily. 02/18/14 02/18/15 Yes Historical Provider, MD  rosuvastatin (CRESTOR) 40 MG tablet Take  0.5 tablets (20 mg total) by mouth daily. Patient not taking: Reported on 02/20/2014 10/27/13   Lewayne Bunting, MD   BP 126/86 mmHg  Pulse 83  Temp(Src) 97.7 F (36.5 C) (Oral)  Resp 20  Ht  (1.803 m)  Wt 266 lb (120.657 kg)  BMI 37.12 kg/m2  SpO2 97% Physical Exam  Constitutional: He is oriented to person, place, and time. He appears well-developed and well-nourished.  HENT:  Head: Normocephalic and atraumatic.  Eyes: Conjunctivae and EOM are normal. Pupils are equal, round, and reactive to light.  Neck: Normal range of motion. Neck supple.  Cardiovascular: Normal rate and regular rhythm.   Pulmonary/Chest: Effort normal and breath sounds normal. He exhibits tenderness.  Tenderness to left chest.  Abdominal: Soft. Bowel sounds are normal.  Musculoskeletal: Normal range of motion.  Neurological: He is alert and oriented to person, place, and time.  Skin: Skin is warm and dry.  Psychiatric: He has a normal mood and affect. His behavior is normal.  Nursing note and vitals reviewed.   ED Course  Procedures (including critical care time)  DIAGNOSTIC STUDIES: Oxygen Saturation is 99% on room air, normal by my interpretation.    COORDINATION OF CARE: 7:14 PM - Discussed treatment plan with pt at bedside which includes EKG, CXR, and blood tests, and pt agreed to plan.     Labs Review Labs Reviewed  BASIC METABOLIC PANEL - Abnormal; Notable for the following:    Glucose, Bld 129 (*)    GFR calc non Af Amer 88 (*)    All other components within normal limits  TROPONIN I - Abnormal; Notable for the following:    Troponin I 1.18 (*)    All other components within normal limits  CBC WITH DIFFERENTIAL/PLATELET  PROTIME-INR  APTT  HEPARIN LEVEL (UNFRACTIONATED)  CBC    Imaging Review Dg Chest 2 View  02/20/2014   CLINICAL DATA:  39 year old male with mid to left chest pain for 2 weeks. Productive cough, bloody sputum. Initial encounter.  EXAM: CHEST  2 VIEW   COMPARISON:  Chest CTA 08/08/2012 and earlier  FINDINGS: Stable cardiomegaly. Other mediastinal contours are within normal limits. Visualized tracheal air column is within normal limits. The lungs are clear. No pneumothorax or effusion. No acute osseous abnormality identified.  IMPRESSION: No active cardiopulmonary disease.   Electronically Signed   By: Odessa Jason M.D.   On: 02/20/2014 19:59     EKG Interpretation   Date/Time:  Saturday February 20 2014 18:51:25 EST Ventricular Rate:  87 PR Interval:  138 QRS Duration: 97 QT Interval:  362 QTC Calculation: 435 R Axis:   63 Text Interpretation:  Sinus  rhythm Normal ECG Confirmed by BEATON  MD,  Mikey ([id number]) on 02/20/2014 6:57:56 PM Also confirmed by Adriana SimasOOK  MD, Avie Checo  [phone]([id number])  on 02/20/2014 8:32:00 PM     CRITICAL CARE Performed by: Donnetta HutchingOOK,Jonovan Boedecker  ?  Total critical care time: 30  Critical care time was exclusive of separately billable procedures and treating other patients.  Critical care was necessary to treat or prevent imminent or life-threatening deterioration.  Critical care was time spent personally by me on the following activities: development of treatment plan with patient and/or surrogate as well as nursing, discussions with consultants, evaluation of patient's response to treatment, examination of patient, obtaining history from patient or surrogate, ordering and performing treatments and interventions, ordering and review of laboratory studies, ordering and review of radiographic studies, pulse oximetry and re-evaluation of patient's condition. MDM   Final diagnoses:  Chest pain, unspecified chest pain type   Multiple cardiac risk factors c chest pain.  EKG normal.  Troponin slightly elevated at 1.18      Will start aspirin, heparin, nitroglycerin drip. Discussed with Dr. Mayford Knifeurner. [facility]  I personally performed the services described in this documentation, which was scribed in my presence. The recorded  information has been reviewed and is accurate.     Donnetta HutchingBrian Merve Hotard, MD 02/20/14 2134

## 2014-02-20 NOTE — ED Notes (Signed)
CRITICAL VALUE ALERT  Critical value received:  Troponin 1.18  Date of notification: 02/20/14  Time of notification:  2023  Critical value read back: yes  Nurse who received alert:  Juanita LasterAmanda Duwan Adrian, RN  MD notified (1st page):  Adriana Simasook  Time of first page:  2024  MD notified (2nd page):  Time of second page:  Responding MD:  Adriana Simasook  Time MD responded:  2024

## 2014-02-20 NOTE — ED Notes (Signed)
EDP at bedside  

## 2014-02-21 ENCOUNTER — Encounter (HOSPITAL_COMMUNITY)
Admission: EM | Disposition: A | Discharge status: Home / Self Care | Payer: Self-pay | Source: Home / Self Care | Attending: Cardiology

## 2014-02-21 ENCOUNTER — Encounter (HOSPITAL_COMMUNITY): Payer: Self-pay | Admitting: Cardiovascular Disease

## 2014-02-21 DIAGNOSIS — I214 Non-ST elevation (NSTEMI) myocardial infarction: Secondary | ICD-10-CM

## 2014-02-21 DIAGNOSIS — I252 Old myocardial infarction: Secondary | ICD-10-CM | POA: Diagnosis present

## 2014-02-21 DIAGNOSIS — R079 Chest pain, unspecified: Secondary | ICD-10-CM

## 2014-02-21 HISTORY — PX: LEFT HEART CATH: SHX5478

## 2014-02-21 LAB — CBC
HCT: 44.1 % (ref 39.0–52.0)
Hemoglobin: 14.9 g/dL (ref 13.0–17.0)
MCH: 29.8 pg (ref 26.0–34.0)
MCHC: 33.8 g/dL (ref 30.0–36.0)
MCV: 88.2 fL (ref 78.0–100.0)
PLATELETS: 202 10*3/uL (ref 150–400)
RBC: 5 MIL/uL (ref 4.22–5.81)
RDW: 12.2 % (ref 11.5–15.5)
WBC: 6.4 10*3/uL (ref 4.0–10.5)

## 2014-02-21 LAB — COMPREHENSIVE METABOLIC PANEL
ALBUMIN: 3.9 g/dL (ref 3.5–5.2)
ALT: 26 U/L (ref 0–53)
AST: 28 U/L (ref 0–37)
Alkaline Phosphatase: 45 U/L (ref 39–117)
Anion gap: 5 (ref 5–15)
BUN: 9 mg/dL (ref 6–23)
CALCIUM: 9 mg/dL (ref 8.4–10.5)
CO2: 29 mmol/L (ref 19–32)
Chloride: 101 mmol/L (ref 96–112)
Creatinine, Ser: 1.02 mg/dL (ref 0.50–1.35)
GFR calc non Af Amer: >90 mL/min (ref >90)
Glucose, Bld: 106 mg/dL — ABNORMAL HIGH (ref 70–99)
POTASSIUM: 3.7 mmol/L (ref 3.5–5.1)
SODIUM: 135 mmol/L (ref 135–145)
Total Bilirubin: 1.1 mg/dL (ref 0.3–1.2)
Total Protein: 6.7 g/dL (ref 6.0–8.3)

## 2014-02-21 LAB — CBC WITH DIFFERENTIAL/PLATELET
Basophils Absolute: 0 10*3/uL (ref 0.0–0.1)
Basophils Relative: 0 % (ref 0–1)
EOS ABS: 0.1 10*3/uL (ref 0.0–0.7)
Eosinophils Relative: 2 % (ref 0–5)
HCT: 45.2 % (ref 39.0–52.0)
HEMOGLOBIN: 15.2 g/dL (ref 13.0–17.0)
LYMPHS PCT: 43 % (ref 12–46)
Lymphs Abs: 3.1 10*3/uL (ref 0.7–4.0)
MCH: 30.2 pg (ref 26.0–34.0)
MCHC: 33.6 g/dL (ref 30.0–36.0)
MCV: 89.7 fL (ref 78.0–100.0)
MONO ABS: 0.6 10*3/uL (ref 0.1–1.0)
Monocytes Relative: 9 % (ref 3–12)
Neutro Abs: 3.3 10*3/uL (ref 1.7–7.7)
Neutrophils Relative %: 46 % (ref 43–77)
Platelets: 189 10*3/uL (ref 150–400)
RBC: 5.04 MIL/uL (ref 4.22–5.81)
RDW: 12.3 % (ref 11.5–15.5)
WBC: 7.1 10*3/uL (ref 4.0–10.5)

## 2014-02-21 LAB — BASIC METABOLIC PANEL
ANION GAP: 8 (ref 5–15)
BUN: 8 mg/dL (ref 6–23)
CALCIUM: 9.6 mg/dL (ref 8.4–10.5)
CHLORIDE: 102 mmol/L (ref 96–112)
CO2: 26 mmol/L (ref 19–32)
Creatinine, Ser: 1.04 mg/dL (ref 0.50–1.35)
GFR calc Af Amer: >90 mL/min (ref >90)
GFR calc non Af Amer: >90 mL/min (ref >90)
GLUCOSE: 95 mg/dL (ref 70–99)
Potassium: 4 mmol/L (ref 3.5–5.1)
SODIUM: 136 mmol/L (ref 135–145)

## 2014-02-21 LAB — MRSA PCR SCREENING: MRSA BY PCR: POSITIVE — AB

## 2014-02-21 LAB — PROTIME-INR
INR: 1.08 (ref 0.00–1.49)
INR: 1.04 (ref 0.00–1.49)
Prothrombin Time: 14.1 seconds (ref 11.6–15.2)
Prothrombin Time: 13.8 seconds (ref 11.6–15.2)

## 2014-02-21 LAB — TROPONIN I
TROPONIN I: 0.68 ng/mL — AB (ref <0.031)
TROPONIN I: 0.51 ng/mL — AB (ref <0.031)
Troponin I: 0.57 ng/mL (ref <0.031)

## 2014-02-21 LAB — LIPID PANEL
Cholesterol: 240 mg/dL — ABNORMAL HIGH (ref 0–200)
HDL: 29 mg/dL — AB (ref >39)
LDL CALC: 182 mg/dL — AB (ref 0–99)
Total CHOL/HDL Ratio: 8.3 RATIO
Triglycerides: 145 mg/dL (ref <150)
VLDL: 29 mg/dL (ref 0–40)

## 2014-02-21 LAB — HEPARIN LEVEL (UNFRACTIONATED)
Heparin Unfractionated: <0.1 IU/mL — ABNORMAL LOW (ref 0.30–0.70)
Heparin Unfractionated: 0.23 IU/mL — ABNORMAL LOW (ref 0.30–0.70)

## 2014-02-21 LAB — APTT: aPTT: 36 seconds (ref 24–37)

## 2014-02-21 LAB — MAGNESIUM: MAGNESIUM: 2.3 mg/dL (ref 1.5–2.5)

## 2014-02-21 LAB — TSH: TSH: 1.696 u[IU]/mL (ref 0.350–4.500)

## 2014-02-21 SURGERY — LEFT HEART CATH
Anesthesia: LOCAL

## 2014-02-21 MED ORDER — ONDANSETRON HCL 4 MG/2ML IJ SOLN: 4.0000 mg | Freq: Four times a day (QID) | INTRAMUSCULAR | Status: DC | PRN

## 2014-02-21 MED ORDER — HEPARIN BOLUS VIA INFUSION
3000.0000 [IU] | Freq: Once | INTRAVENOUS | Status: AC
  Administered 2014-02-21: 3000 [IU] via INTRAVENOUS
  Filled 2014-02-21: qty 3000

## 2014-02-21 MED ORDER — SODIUM CHLORIDE 0.9 % IV SOLN
INTRAVENOUS | Status: AC
  Administered 2014-02-21: 18:00:00 via INTRAVENOUS

## 2014-02-21 MED ORDER — PANTOPRAZOLE SODIUM 40 MG PO TBEC
40.0000 mg | DELAYED_RELEASE_TABLET | Freq: Every day | ORAL | Status: DC
  Administered 2014-02-21 – 2014-02-22 (×2): 40 mg via ORAL
  Filled 2014-02-21 (×2): qty 1

## 2014-02-21 MED ORDER — GI COCKTAIL ~~LOC~~
30.0000 mL | Freq: Three times a day (TID) | ORAL | Status: DC | PRN
  Administered 2014-02-21 (×2): 30 mL via ORAL
  Filled 2014-02-21 (×2): qty 30

## 2014-02-21 MED ORDER — MORPHINE SULFATE 2 MG/ML IJ SOLN
INTRAMUSCULAR | Status: AC
  Filled 2014-02-21: qty 1

## 2014-02-21 MED ORDER — SODIUM CHLORIDE 0.9 % IJ SOLN: 3.0000 mL | INTRAMUSCULAR | Status: DC | PRN

## 2014-02-21 MED ORDER — NITROGLYCERIN 0.4 MG SL SUBL
0.4000 mg | SUBLINGUAL_TABLET | SUBLINGUAL | Status: DC | PRN
  Administered 2014-02-21 (×3): 0.4 mg via SUBLINGUAL
  Filled 2014-02-21: qty 1

## 2014-02-21 MED ORDER — ASPIRIN 81 MG PO CHEW: 81.0000 mg | CHEWABLE_TABLET | ORAL | Status: DC

## 2014-02-21 MED ORDER — CLOPIDOGREL BISULFATE 75 MG PO TABS
75.0000 mg | ORAL_TABLET | Freq: Every day | ORAL | Status: DC
  Administered 2014-02-22: 75 mg via ORAL
  Filled 2014-02-21 (×2): qty 1

## 2014-02-21 MED ORDER — LIDOCAINE HCL (PF) 1 % IJ SOLN
INTRAMUSCULAR | Status: AC
  Filled 2014-02-21: qty 30

## 2014-02-21 MED ORDER — HYDROCHLOROTHIAZIDE 12.5 MG PO CAPS
12.5000 mg | ORAL_CAPSULE | Freq: Every day | ORAL | Status: DC
  Administered 2014-02-21: 12.5 mg via ORAL
  Filled 2014-02-21: qty 1

## 2014-02-21 MED ORDER — DILTIAZEM HCL 30 MG PO TABS
30.0000 mg | ORAL_TABLET | Freq: Four times a day (QID) | ORAL | Status: DC
  Filled 2014-02-21 (×4): qty 1

## 2014-02-21 MED ORDER — SODIUM CHLORIDE 0.9 % IJ SOLN
3.0000 mL | Freq: Two times a day (BID) | INTRAMUSCULAR | Status: DC
  Administered 2014-02-21: 3 mL via INTRAVENOUS

## 2014-02-21 MED ORDER — HEPARIN BOLUS VIA INFUSION
1500.0000 [IU] | Freq: Once | INTRAVENOUS | Status: AC
  Administered 2014-02-21: 1500 [IU] via INTRAVENOUS
  Filled 2014-02-21: qty 1500

## 2014-02-21 MED ORDER — PNEUMOCOCCAL VAC POLYVALENT 25 MCG/0.5ML IJ INJ: 0.5000 mL | INJECTION | INTRAMUSCULAR | Status: DC

## 2014-02-21 MED ORDER — ASPIRIN 81 MG PO CHEW: 324.0000 mg | CHEWABLE_TABLET | ORAL | Status: DC

## 2014-02-21 MED ORDER — CLOPIDOGREL BISULFATE 300 MG PO TABS
ORAL_TABLET | ORAL | Status: AC
  Filled 2014-02-21: qty 2

## 2014-02-21 MED ORDER — MIDAZOLAM HCL 2 MG/2ML IJ SOLN
INTRAMUSCULAR | Status: AC
  Filled 2014-02-21: qty 2

## 2014-02-21 MED ORDER — SUCRALFATE 1 G PO TABS
1.0000 g | ORAL_TABLET | Freq: Four times a day (QID) | ORAL | Status: DC
  Administered 2014-02-21 – 2014-02-22 (×4): 1 g via ORAL
  Filled 2014-02-21 (×9): qty 1

## 2014-02-21 MED ORDER — ACETAMINOPHEN 325 MG PO TABS
650.0000 mg | ORAL_TABLET | ORAL | Status: DC | PRN
Start: 2014-02-21 — End: 2014-02-22

## 2014-02-21 MED ORDER — MORPHINE SULFATE 2 MG/ML IJ SOLN
2.0000 mg | INTRAMUSCULAR | Status: AC
  Administered 2014-02-21: 2 mg via INTRAVENOUS

## 2014-02-21 MED ORDER — LISINOPRIL 10 MG PO TABS
10.0000 mg | ORAL_TABLET | Freq: Every day | ORAL | Status: DC
  Administered 2014-02-21 – 2014-02-22 (×2): 10 mg via ORAL
  Filled 2014-02-21 (×2): qty 1

## 2014-02-21 MED ORDER — HEPARIN (PORCINE) IN NACL 2-0.9 UNIT/ML-% IJ SOLN
INTRAMUSCULAR | Status: AC
  Filled 2014-02-21: qty 1000

## 2014-02-21 MED ORDER — LISINOPRIL-HYDROCHLOROTHIAZIDE 20-25 MG PO TABS: 0.5000 | ORAL_TABLET | Freq: Every day | ORAL | Status: DC

## 2014-02-21 MED ORDER — LORAZEPAM 1 MG PO TABS
1.0000 mg | ORAL_TABLET | Freq: Three times a day (TID) | ORAL | Status: DC | PRN
  Administered 2014-02-21: 1 mg via ORAL
  Filled 2014-02-21: qty 1

## 2014-02-21 MED ORDER — CHLORHEXIDINE GLUCONATE CLOTH 2 % EX PADS: 6.0000 | MEDICATED_PAD | Freq: Every day | CUTANEOUS | Status: DC

## 2014-02-21 MED ORDER — FENTANYL CITRATE 0.05 MG/ML IJ SOLN
INTRAMUSCULAR | Status: AC
  Filled 2014-02-21: qty 2

## 2014-02-21 MED ORDER — ASPIRIN EC 81 MG PO TBEC
81.0000 mg | DELAYED_RELEASE_TABLET | Freq: Every day | ORAL | Status: DC
Start: 2014-02-21 — End: 2014-02-22
  Administered 2014-02-21 – 2014-02-22 (×2): 81 mg via ORAL
  Filled 2014-02-21 (×2): qty 1

## 2014-02-21 MED ORDER — SODIUM CHLORIDE 0.9 % IV SOLN: 250.0000 mL | INTRAVENOUS | Status: DC | PRN

## 2014-02-21 MED ORDER — ASPIRIN 300 MG RE SUPP
300.0000 mg | RECTAL | Status: DC
  Filled 2014-02-21: qty 1

## 2014-02-21 MED ORDER — MUPIROCIN 2 % EX OINT
1.0000 "application " | TOPICAL_OINTMENT | Freq: Two times a day (BID) | CUTANEOUS | Status: DC
  Administered 2014-02-21 – 2014-02-22 (×2): 1 via NASAL
  Filled 2014-02-21: qty 22

## 2014-02-21 MED ORDER — SODIUM CHLORIDE 0.9 % IV SOLN: INTRAVENOUS | Status: DC

## 2014-02-21 MED ORDER — ASPIRIN EC 81 MG PO TBEC: 81.0000 mg | DELAYED_RELEASE_TABLET | Freq: Every day | ORAL | Status: DC

## 2014-02-21 NOTE — Interval H&P Note (Signed)
History and Physical Interval Note:  02/21/2014 4:28 PM  Jason Morris  has presented today for cardiac cath with the diagnosis of NSTEMI.   The various methods of treatment have been discussed with the patient and family. After consideration of risks, benefits and other options for treatment, the patient has consented to  Procedure(s): LEFT HEART CATH (N/A) as a surgical intervention .  The patient's history has been reviewed, patient examined, no change in status, stable for surgery.  I have reviewed the patient's chart and labs.  Questions were answered to the patient's satisfaction.    Cath Lab Visit (complete for each Cath Lab visit)  Clinical Evaluation Leading to the Procedure:   ACS: Yes.    Non-ACS:    Anginal Classification: CCS IV  Anti-ischemic medical therapy: Minimal Therapy (1 class of medications)  Non-Invasive Test Results: No non-invasive testing performed  Prior CABG: No previous CABG        Cherryl Babin

## 2014-02-21 NOTE — Progress Notes (Signed)
Dr Gala RomneyBensimhon at bedside , assessing Pt . the patient states the "burning " c/p is "2" on pain scale .See also the10:36am  12-lead EKG .Carafate and protonix( scheduled for 10am) recently admin. New orders also received .

## 2014-02-21 NOTE — Progress Notes (Signed)
Han pneumonia vaccine approx 1 yr ago ---no reactions.

## 2014-02-21 NOTE — Progress Notes (Signed)
Pt refused cardizem (30 mg p.o.) .Shaking his head,Pt stated ,No. That's what killed my dad.Med info to be printed out  And given to  the Pt.

## 2014-02-21 NOTE — Progress Notes (Signed)
C/o mild stomach"upset" (  Constant x 1 week). No other pain,pressure,tightness,dizziness,etc..Marland Kitchen..Marland Kitchen

## 2014-02-21 NOTE — Progress Notes (Addendum)
Dr. Norris Crossurner's H&P from earlier this morning reviewed.  Patient seen and examined.Continues with indigestion radiating to chest. Improved with eating ice. Says symptoms really started after recent stomach virus. Able to work without CP.  FOBT pending.   Troponins trending down. Unable to tolerate b-block or statin in past. Will use low-dose cardizem.   Will plan cath tomorrow to further evaluate NSTEMI. Continue carafate, PPI. PRN GI cocktail. Will need GI to see - will consult tomorrow.   Daniel Bensimhon,MD 10:56 AM

## 2014-02-21 NOTE — Progress Notes (Addendum)
See nurse notes. CTSP due to ongoing symptoms. Patient reports ongoing left sided chest burning that radiates down left arm and to jaw. This is same [facility]. He was not sent for further evaluation at that time.  He states this discomfort is different than the symptoms he had on admission, which was a different central chest pain relieved by GI cocktail, ice.  His current pain has been unrelieved by GI cocktail, SL NTGx3, Ativan, and Carafate. He has been on PPI and heparin. The patient has not been able to tolerate BB before, and refused Cardizem because of a bad experience his father had with it.  Patient in no acute distress. He does appear uncomfortable with Levine's sign. He is somewhat tearful. Denies illicit drug use. Not hypoxic. No SOB or LEE. 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 No family or personal history of blood clots. EKGs generally unrevealing during his stay thus far. Troponins trending down. However, given significant ongoing pain unrelieved by multiple attempts in the setting of + enzymes and known moderate disease in 2014 will proceed with urgent cath. Dr. Eden EmmsNishan also assessed patient at bedside. Cath lab team paged out for urgent case. Dr. Clifton JamesMcAlhany was briefed on the situation as well. Ronie Spiesayna Dunn PA-C   Patient examined chart reviewed.  Discussed with patient family and Dr Clifton JamesMcalhany.  Reviewed his CT from 2014 and markedly abnormal with very aneurysmal and tortuous RCA with lots of mixed plaque and calcium score over 3,000 LM ostium also smaller than LAD which is ectatic.  ECG with inferior J point elevation and positive troponin.    Care link called Dr Clifton JamesMcalhany called Patient prepped  Critical care time for management of unstable angina 30 minutes  Charlton HawsPeter Madigan Rosensteel

## 2014-02-21 NOTE — Progress Notes (Signed)
Utilization Review Completed.Jason Morris T2/14/2016  

## 2014-02-21 NOTE — Progress Notes (Signed)
CRITICAL VALUE ALERT  Critical value received: Trop 0.683  Date of notification:  02/21/2014  Time of notification:  0520  Critical value read back:Yes.    Nurse who received alert:  A.Noam Franzen,RN  MD notified (1st page): Turner,MD  Time of first page:  0535  MD notified (2nd page):  Time of second page:  Responding MD: Mayford Knifeurner  Time MD responded: [phone]

## 2014-02-21 NOTE — ED Notes (Signed)
Carelink at bedside 

## 2014-02-21 NOTE — H&P (Signed)
Admit date: 02/20/2014 Referring Physician: Dr. Adriana Simas Primary Cardiologist: Dr. Jens Som Chief complaint/reason for admission:Chest apin  HPI: [name] is  a 39 year old male with multiple issues. 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 Cardiac CT in 08/2012 with aneurysmal dilatation of the RCA, 2 serial 70% lesions in the proximal RCA and 50% in the PD. CA score of 3557. Other issues include HTN, ongoing tobacco abuse, HLD, anxiety, migraines, chronic low back pain, & depression.  He has stopped Lipitor and Lisinopril in the past due to rash. He has also stopped Norvasc due to thinking it was decreasing his heart rate. It is noted that he has not tolerated beta blocker.   Echo 8/14 showed normal LV function and mild LAE. Myovue was ordered previously to evaluate significance of RCA disease but he did not get it done and also did not have renal dopplers or catecholamines/metanephrines checked. He was seen  at [facility] with what is described as multiple complaints including chest pain. Troponin negative. Liver functions normal. Hemoglobin normal. Chest x-ray with no active disease. He was seen by Dr. Jens Som  10/27 with some chest discomfort that increased with certain movements and a chest burning that improved with Zantac.  A nuclear stress test was ordered that showed no ischemia.    He presented to [facility] ER tonight with complaints of chest pain in the center with radiation to the left off and on for 2 weeks.  It also radiated to the back side of the left arm and back.  Apparently the last time he had this pain he had had bronchitis.  He did get some relief with Carafate and Zantac in the ER. Troponin elevated at 1.18 and he is now transferred to [facility] for further evaluation and treatment.   Apparently he ran out of his dexilant a week ago.  He says that he had a stomach "bug" a week ago with nausea and vomiting and stopped taking his all of  his medications but restarted his BP med a few day ago along with the Zantac and carafate but not the Dexilant. He saw his PCP on Thursday to see if he had bronchitis and when he laid back on the table he had pain going across his chest and radiated into his jaw and teeth, neck and both arms.  EKG was done which was fine.  His MD told him it was anxiety.  When he sat up the pain went away when he drank ice. He took a zantac and ativan and it was completely gone.  He describes the pain as something stuck in the middle of his chest.  If he would drink something it would go away but come back and would be better with belching.  He denies any nausea, diaphoresis or nausea.  He says that about 3 nights ago he awakened with lower abdominal stomach cramps and broke out in a sweat sitting on the toilet and thinks he passed out for few minutes but did not have any CP.  He says that the CP would not go away tonight and went around him like a band so he went to the ER.  He denied any cough, fever, chills.  At [facility] he was noted to have an elevated troponin and he is now tranferred here for further evaluation.     PMH:    Past Medical History  Diagnosis Date  . Hypertension   . Anxiety   .  Chest pain     a. admx 8/14 - CEs neg but coronary Ca on chest CTA  . GERD (gastroesophageal reflux disease)   . Migraines     "get them if I lay on my left side or am active outside; pretty much qd til the last 5 days" (08/09/2012)  . Chronic lower back pain   . Depression   . HLD (hyperlipidemia)   . CAD (coronary artery disease)     advanced; a. Chest CTA 8/14 with coronary Ca and RCA aneurysmal dilatation    PSH:    Past Surgical History  Procedure Laterality Date  . Myringotomy with tube placement Bilateral     "4-5 times as a child" (08/09/2012)    ALLERGIES:   Lipitor; Lopressor; Eggs or egg-derived products; and Lactose intolerance (gi)  Prior to Admit Meds:   Prescriptions prior to admission  Medication  Sig Dispense Refill Last Dose  . acetaminophen (TYLENOL) 500 MG tablet Take 500 mg by mouth every 8 (eight) hours as needed for mild pain.    Past Week  . aspirin EC 81 MG tablet Take 1 tablet (81 mg total) by mouth daily. 90 tablet 3 02/20/2014  . dexlansoprazole (DEXILANT) 60 MG capsule Take 60 mg by mouth daily.   Past Month  . lisinopril-hydrochlorothiazide (PRINZIDE,ZESTORETIC) 20-25 MG per tablet Take 0.5 tablets by mouth daily.    02/20/2014  . LORazepam (ATIVAN) 1 MG tablet Take 1 mg by mouth 3 (three) times daily as needed for anxiety.    Past Week  . Multiple Vitamin (MULTIVITAMIN) tablet Take 1 tablet by mouth daily.   02/20/2014  . ranitidine (ZANTAC) 150 MG tablet Take 150 mg by mouth 2 (two) times daily.   02/20/2014  . sucralfate (CARAFATE) 1 G tablet Take 1 g by mouth 4 (four) times daily.   02/20/2014  . rosuvastatin (CRESTOR) 40 MG tablet Take 0.5 tablets (20 mg total) by mouth daily. (Patient not taking: Reported on 02/20/2014) 30 tablet 12 Taking   Family HX:   No family history on file. Social HX:    History   Social History  . Marital Status: Divorced    Spouse Name: N/A  . Number of Children: N/A  . Years of Education: N/A   Occupational History  . Not on file.   Social History Main Topics  . Smoking status: Heavy Tobacco Smoker -- 0.50 packs/day for 17 years    Types: Cigarettes  . Smokeless tobacco: Never Used     Comment: 08/09/2012 "smoked 3 ppd til 1 month ago"   . Alcohol Use: No     Comment: 08/09/2012 "had a mixed drink once in the past year"  . Drug Use: No  . Sexual Activity: Yes   Other Topics Concern  . Not on file   Social History Narrative     ROS:  All 11 ROS were addressed and are negative except what is stated in the HPI  PHYSICAL EXAM Filed Vitals:   02/20/14 2345  BP: 93/61  Pulse: 63  Temp:   Resp: 19   General: Well developed, well nourished, in no acute distress Head: Eyes PERRLA, No xanthomas.   Normal cephalic and  atramatic  Lungs:   Clear bilaterally to auscultation and percussion. Heart:   HRRR S1 S2 Pulses are 2+ & equal.            No carotid bruit. No JVD.  No abdominal bruits. No femoral bruits. Abdomen: Bowel sounds are positive, abdomen  soft and non-tender without masses  Extremities:   No clubbing, cyanosis or edema.  DP +1 Neuro: Alert and oriented X 3. Psych:  Good affect, responds appropriately   Labs:   Lab Results  Component Value Date   WBC 9.0 02/20/2014   HGB 15.6 02/20/2014   HCT 46.1 02/20/2014   MCV 89.7 02/20/2014   PLT 206 02/20/2014     Recent Labs Lab 02/20/14 1900  NA 136  K 3.5  CL 104  CO2 27  BUN 12  CREATININE 1.07  CALCIUM 9.4  GLUCOSE 129*   Lab Results  Component Value Date   TROPONINI 1.18* 02/20/2014   No results found for: PTT Lab Results  Component Value Date   INR 0.98 02/20/2014     Lab Results  Component Value Date   CHOL 206* 03/02/2013   CHOL 222* 08/09/2012   Lab Results  Component Value Date   HDL 25.00* 03/02/2013   HDL 24* 08/09/2012   Lab Results  Component Value Date   LDLCALC 164* 08/09/2012   Lab Results  Component Value Date   TRIG 220.0* 03/02/2013   TRIG 172* 08/09/2012   Lab Results  Component Value Date   CHOLHDL 8 03/02/2013   CHOLHDL 9.3 08/09/2012   Lab Results  Component Value Date   LDLDIRECT 151.2 03/02/2013      Radiology:  Dg Chest 2 View  02/20/2014   CLINICAL DATA:  39 year old male with mid to left chest pain for 2 weeks. Productive cough, bloody sputum. Initial encounter.  EXAM: CHEST  2 VIEW  COMPARISON:  Chest CTA 08/08/2012 and earlier  FINDINGS: Stable cardiomegaly. Other mediastinal contours are within normal limits. Visualized tracheal air column is within normal limits. The lungs are clear. No pneumothorax or effusion. No acute osseous abnormality identified.  IMPRESSION: No active cardiopulmonary disease.   Electronically Signed   By: Odessa FlemingH  Hall M.D.   On: 02/20/2014 19:59    EKG:   NSR with no ST changes  ASSESSMENT:  1.  NSTEMI with ongoing chest pain [facility].  EKG is nonischemic.  His CP has typical and atypical components.  It is improved some with Carafate and Zantac and feels like he has to burp.  Pain similar to what he had with recent bronchitis but chest xray clear with no cough or sputum production.  Recent myoview with no ischemia.  Initial trop elevated at 1.1 2.  ASCAD with coronary artery calcifications and aneurysmal dilatation of the RCA on Chest CT 8/14 3.  HTN - controlled 4.  GERD 5.  Hyperlipidemia  PLAN:   1.  Admit to stepdown 2.  Cycle cardiac enzymes until they peak 3.  IV Heparin gtt 4.  IV NTG and titrate for CP as BP tolerates 5.  Continue ASA/ACE I 6.  2D echo to reevaluate LVF for RWMA's 7.  Further workup pending trend of cardiac enzymes 8.  He is not on a statin due to ? Pancreatic problem per the patient. 9.  Continue Carafate/Zantac  Quintella ReichertURNER,Charise Leinbach R, MD  02/21/2014  1:21 AM

## 2014-02-21 NOTE — Progress Notes (Addendum)
Pt  C/o Chest pain and burning .  See 12-lead EKG . GI cocktail and  NTG 0.4mg ( given SL x 3 ) provided no relief . Ativan 1 mg p.o. and  carafate 1 gm(currently due, also p.o.) given and Dr paged .Pt still rates pain at "8" on pain scale . Heparin gtt infusing w/o diff. Pt lying on  Rt side in bed .Resps even and unlabored . No diaphoresis noted. Skin  Color wnl. cephalocaudel assessment done( lung sounds notably improved since initial assessment).

## 2014-02-21 NOTE — Progress Notes (Signed)
Risks and benefits of cardiac catheterization have been discussed with the patient.  These include bleeding, infection, kidney damage, stroke, heart attack, death.  The patient understands these risks and is willing to proceed. Piero Mustard PA-C

## 2014-02-21 NOTE — Progress Notes (Signed)
ANTICOAGULATION CONSULT NOTE - Follow Up Consult  Pharmacy Consult for heparin Indication: NSTEMI  Labs:  Recent Labs  02/20/14 1900 02/21/14 0138  HGB 15.6 15.2  HCT 46.1 45.2  PLT 206 189  APTT 30 36  LABPROT 13.1 14.1  INR 0.98 1.08  HEPARINUNFRC  --  <0.10*  CREATININE 1.07 1.02  TROPONINI 1.18* 0.68*     Assessment: 38yo male undetectable on heparin with initial dosing for NSTEMI; lab drawn early though would expect a higher level given bolus so suspect pt requires higher rate.  Goal of Therapy:  Heparin level 0.3-0.7 units/ml   Plan:  Will rebolus with heparin 3000 units and increase gtt by 4 units/kg/hr to 1800 units/hr and check level in 6hr.  Vernard GamblesVeronda Haydan Wedig, PharmD, BCPS  02/21/2014,5:34 AM

## 2014-02-21 NOTE — Progress Notes (Signed)
ANTICOAGULATION CONSULT NOTE - Follow Up Consult  Pharmacy Consult for heparin Indication: NSTEMI  Allergies  Allergen Reactions  . Lipitor [Atorvastatin] Hives  . Lopressor [Metoprolol Tartrate] Shortness Of Breath    Felt like he was going to die , could not breath & syncope  . Eggs Or Egg-Derived Products Other (See Comments)    Really bad stomach pain  . Lactose Intolerance (Gi) Diarrhea    Patient Measurements: Height: 5' 11.5" (181.6 cm) Weight: 261 lb 11 oz (118.7 kg) IBW/kg (Calculated) : 76.45 Heparin Dosing Weight: 102 kg  Vital Signs: Temp: 97.7 F (36.5 C) (02/14 1200) Temp Source: Oral (02/14 1200) BP: 122/81 mmHg (02/14 1200) Pulse Rate: 63 (02/14 1200)  Labs:  Recent Labs  02/20/14 1900 02/21/14 0138 02/21/14 0930 02/21/14 1120  HGB 15.6 15.2 14.9  --   HCT 46.1 45.2 44.1  --   PLT 206 189 202  --   APTT 30 36  --   --   LABPROT 13.1 14.1 13.8  --   INR 0.98 1.08 1.04  --   HEPARINUNFRC  --  <0.10*  --  0.23*  CREATININE 1.07 1.02 1.04  --   TROPONINI 1.18* 0.68* 0.57*  --     Estimated Creatinine Clearance: 129.7 mL/min (by C-G formula based on Cr of 1.04).   Medications:  Infusions:  . [START ON 02/22/2014] sodium chloride    . heparin 1,800 Units/hr (02/21/14 0544)  . nitroGLYCERIN Stopped (02/21/14 0150)    Assessment: 39 y/o male [facility]. He continues on IV heparin.   Heparin level is just below goal at 0.23 on 1800 units/hr. No bleeding noted, CBC is normal. Plan is for cath tomorrow.  Goal of Therapy:  Heparin level 0.3-0.7 units/ml Monitor platelets by anticoagulation protocol: Yes   Plan:  - Heparin 1500 units IV bolus then increase rate to 2000 units/hr - 6 hr heparin level - Daily heparin level and CBC - Monitor for s/sx of bleeding  Hill Regional HospitalJennifer , ThebaPharm.D., BCPS Clinical Pharmacist Pager: [phone] 02/21/2014 12:38 PM

## 2014-02-21 NOTE — Progress Notes (Addendum)
Pt watching video # 115( cardiac cath,PCI,stents) with his mother at bedside.

## 2014-02-21 NOTE — CV Procedure (Signed)
      Cardiac Catheterization Operative Report  Jason KlinefelterRobert G Morris [id number] 2/14/20165:17 PM No PCP Per Patient  Procedure Performed:  1. Left Heart Catheterization 2. Selective Coronary Angiography 3. Left ventricular angiogram  Operator: Verne Carrowhristopher Mane Consolo, MD  Arterial access site:  Right radial artery.   Indication: 39 yo male with history of tobacco abuse, known CAD admitted with chest pain, elevated troponin. Ongoing chest pain today. Urgent cardiac cath.                               Procedure Details: The risks, benefits, complications, treatment options, and expected outcomes were discussed with the patient. The patient and/or family concurred with the proposed plan, giving informed consent. The patient was brought to the cath lab after IV hydration was begun and oral premedication was given. The patient was further sedated with Versed and Fentanyl. The right wrist was assessed with a modified Allens test which was positive. The right wrist was prepped and draped in a sterile fashion. 1% lidocaine was used for local anesthesia. Using the modified Seldinger access technique, a 5 French sheath was placed in the right radial artery. 3 mg Verapamil was given through the sheath. 6000 units IV heparin was given. Standard diagnostic catheters were used to perform selective coronary angiography. A pigtail catheter was used to perform a left ventricular angiogram. The sheath was removed from the right radial artery and a Terumo hemostasis band was applied at the arteriotomy site on the right wrist.   There were no immediate complications. The patient was taken to the recovery area in stable condition.   Hemodynamic Findings: Central aortic pressure: 106/68 Left ventricular pressure: 108/4/9  Angiographic Findings:  Left main:  No obstructive disease.   Left Anterior Descending Artery: Large caliber vessel that courses to the apex. The proximal and mid vessel has aneurysmal segments  but no focally obstructive lesions. The diagonal is a moderate caliber vessel with aneurysmal segments but no focally obstructive disease.   Circumflex Artery: Small caliber vessel with small caliber obtuse marginal branch. The OM is aneurysmal but there are no focally obstructive lesions.   Right Coronary Artery: Large dominant vessel with severe aneurysmal segment in the proximal, mid and distal vessel. The aneurysmal segments are 10-12 mm in diameter. The non-aneurysmal segments are normal in caliber. There is a large caliber posterolateral branch with diffuse aneurysmal dilatation. The PDA is small to moderate in caliber with aneurysmal segments. There are no flow limiting lesions.   Left Ventricular Angiogram: LVEF=60-65%.   Impression: 1. Severe aneurysmal dilatation of the RCA, LAD and Circumflex without flow limiting lesions 2. Normal LV systolic function 3. Minimally elevated troponin, now trending down  Recommendations: I have loaded with Plavix 600 mg in the cath lab. I would continue ASA and Plavix for lifetime given risk of thrombus formation with aneurysmal CAD. Tobacco cessation. Consider further workup for vasculitis/intracerebral arterial disease.        Complications:  None. The patient tolerated the procedure well.

## 2014-02-21 NOTE — Progress Notes (Signed)
Pt is currently watching video # 115 ( cardiac cath, PCI and stents) after verbal info briefly provided .

## 2014-02-21 NOTE — H&P (View-Only) (Signed)
Dr. Turner's H&P from earlier this morning reviewed.  Patient seen and examined.Continues with indigestion radiating to chest. Improved with eating ice. Says symptoms really started after recent stomach virus. Able to work without CP.  FOBT pending.   Troponins trending down. Unable to tolerate b-block or statin in past. Will use low-dose cardizem.   Will plan cath tomorrow to further evaluate NSTEMI. Continue carafate, PPI. PRN GI cocktail. Will need GI to see - will consult tomorrow.   Jason Paola,MD 10:56 AM   

## 2014-02-22 ENCOUNTER — Telehealth: Payer: Self-pay | Admitting: Cardiology

## 2014-02-22 ENCOUNTER — Encounter (HOSPITAL_COMMUNITY): Payer: Self-pay | Admitting: Physician Assistant

## 2014-02-22 DIAGNOSIS — I2510 Atherosclerotic heart disease of native coronary artery without angina pectoris: Secondary | ICD-10-CM | POA: Diagnosis present

## 2014-02-22 DIAGNOSIS — I25119 Atherosclerotic heart disease of native coronary artery with unspecified angina pectoris: Secondary | ICD-10-CM

## 2014-02-22 DIAGNOSIS — E785 Hyperlipidemia, unspecified: Secondary | ICD-10-CM

## 2014-02-22 DIAGNOSIS — F419 Anxiety disorder, unspecified: Secondary | ICD-10-CM

## 2014-02-22 DIAGNOSIS — Z720 Tobacco use: Secondary | ICD-10-CM

## 2014-02-22 LAB — CBC
HEMATOCRIT: 43.7 % (ref 39.0–52.0)
HEMOGLOBIN: 14.5 g/dL (ref 13.0–17.0)
MCH: 29.2 pg (ref 26.0–34.0)
MCHC: 33.2 g/dL (ref 30.0–36.0)
MCV: 88.1 fL (ref 78.0–100.0)
Platelets: 207 10*3/uL (ref 150–400)
RBC: 4.96 MIL/uL (ref 4.22–5.81)
RDW: 12.3 % (ref 11.5–15.5)
WBC: 7.3 10*3/uL (ref 4.0–10.5)

## 2014-02-22 LAB — HEMOGLOBIN A1C
Hgb A1c MFr Bld: 6 % — ABNORMAL HIGH (ref 4.8–5.6)
Mean Plasma Glucose: 126 mg/dL

## 2014-02-22 LAB — OCCULT BLOOD X 1 CARD TO LAB, STOOL: FECAL OCCULT BLD: NEGATIVE

## 2014-02-22 MED ORDER — LISINOPRIL 10 MG PO TABS: 10.0000 mg | ORAL_TABLET | Freq: Every day | ORAL | Status: DC

## 2014-02-22 MED ORDER — PRAVASTATIN SODIUM 20 MG PO TABS: 20.0000 mg | ORAL_TABLET | Freq: Every day | ORAL | Status: DC

## 2014-02-22 MED ORDER — NICOTINE 21 MG/24HR TD PT24: 21.0000 mg | MEDICATED_PATCH | Freq: Every day | TRANSDERMAL | Status: DC

## 2014-02-22 MED ORDER — CLOPIDOGREL BISULFATE 75 MG PO TABS: 75.0000 mg | ORAL_TABLET | Freq: Every day | ORAL | Status: DC

## 2014-02-22 MED ORDER — PANTOPRAZOLE SODIUM 40 MG PO TBEC: 40.0000 mg | DELAYED_RELEASE_TABLET | Freq: Every day | ORAL | Status: DC

## 2014-02-22 MED ORDER — PRAVASTATIN SODIUM 20 MG PO TABS
20.0000 mg | ORAL_TABLET | Freq: Every day | ORAL | Status: DC
  Filled 2014-02-22: qty 1

## 2014-02-22 MED ORDER — NITROGLYCERIN 0.4 MG SL SUBL: 0.4000 mg | SUBLINGUAL_TABLET | SUBLINGUAL | Status: DC | PRN

## 2014-02-22 NOTE — Telephone Encounter (Signed)
Pt calling 1 day after undergoing radial LHC. Describes the sensation that the arm feels a little swollen. Visually, he can't see significant swelling. No drainage or erythema at puncture sight. No streaking. No parathesias or numbness in fingers. Normal strength. Color normal. Did have IV in his R AC. Recommended continued monitoring and if any of the red flag symptoms described above occur, should seek care immediately. Verbalized understanding. Followup appt scheduled.

## 2014-02-22 NOTE — Progress Notes (Signed)
CARDIAC REHAB PHASE I   PRE:  Rate/Rhythm: 67 SR    BP: sitting 123/83    SaO2:   MODE:  Ambulation: 700 ft   POST:  Rate/Rhythm: 77 SR    BP: sitting 125/85     SaO2:   Tolerated well, no c/o. 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 Discussed smoking cessation, diet, ex, radial restrictions and Plavix. Voiced understanding. Pt is curious if this will happen to him again.  [id number]  Elissa LovettReeve, Jaquasha [place] ValleyKristan CES, ACSM 02/22/2014 11:23 AM

## 2014-02-22 NOTE — Progress Notes (Signed)
Progress Note  Subjective:    Doing well this AM, some mild reflux-type pain. Had several questions about chest pain today. No nausea, vomiting, or SOB.   Objective:   Temp:  [97.3 F (36.3 C)-97.9 F (36.6 C)] 97.9 F (36.6 C) (02/15 0807) Pulse Rate:  [58-120] 62 (02/14 2300) Resp:  [18-20] 18 (02/14 1538) BP: (105-146)/(56-93) 132/72 mmHg (02/15 0807) SpO2:  [88 %-100 %] 97 % (02/14 1915) Last BM Date: 02/20/14  Filed Weights   02/20/14 1854 02/21/14 0100  Weight: 266 lb (120.657 kg) 261 lb 11 oz (118.7 kg)    Intake/Output Summary (Last 24 hours) at 02/22/14 1043 Last data filed at 02/22/14 0000  Gross per 24 hour  Intake   2056 ml  Output   1450 ml  Net    606 ml    Telemetry: NSR  Physical Exam: General: Overweight white male, alert, cooperative, NAD. HEENT: PERRL, EOMI. Moist mucus membranes Neck: Full range of motion without pain, supple, no lymphadenopathy or carotid bruits Lungs: Clear to ascultation bilaterally, normal work of respiration, no wheezes, rales, rhonchi Heart: RRR, no murmurs, gallops, or rubs Abdomen: Soft, non-tender, non-distended, BS + Extremities: No cyanosis, clubbing, or edema Neurologic: Alert & oriented x3, cranial nerves II-XII intact, strength grossly intact, sensation intact to light touch   Lab Results:  Basic Metabolic Panel:  Recent Labs Lab 02/20/14 1900 02/21/14 0138 02/21/14 0930  NA 136 135 136  K 3.5 3.7 4.0  CL 104 101 102  CO2 GLUCOSE 129* 106* 95  BUN CREATININE 1.07 1.02 1.04  CALCIUM 9.4 9.0 9.6  MG  --  2.3  --     Liver Function Tests:  Recent Labs Lab 02/21/14 0138  AST 28  ALT 26  ALKPHOS 45  BILITOT 1.1  PROT 6.7  ALBUMIN 3.9    CBC:  Recent Labs Lab 02/21/14 0138 02/21/14 0930 02/22/14 0250  WBC 7.1 6.4 7.3  HGB 15.2 14.9 14.5  HCT 45.2 44.1 43.7  MCV 89.7 88.2 88.1  PLT 189 202 207    Cardiac Enzymes:  Recent Labs Lab 02/21/14 0138  02/21/14 0930 02/21/14 1350  TROPONINI 0.68* 0.57* 0.51*    BNP:  Recent Labs  03/02/13 1120  PROBNP 14.0    Coagulation:  Recent Labs Lab 02/20/14 1900 02/21/14 0138 02/21/14 0930  INR 0.98 1.08 1.04    Radiology: Dg Chest 2 View  02/20/2014   CLINICAL DATA:  39 year old male with mid to left chest pain for 2 weeks. Productive cough, bloody sputum. Initial encounter.  EXAM: CHEST  2 VIEW  COMPARISON:  Chest CTA 08/08/2012 and earlier  FINDINGS: Stable cardiomegaly. Other mediastinal contours are within normal limits. Visualized tracheal air column is within normal limits. The lungs are clear. No pneumothorax or effusion. No acute osseous abnormality identified.  IMPRESSION: No active cardiopulmonary disease.   Electronically Signed   By: Odessa Fleming M.D.   On: 02/20/2014 19:59     Medications:   Scheduled Medications: . aspirin EC  81 mg Oral Daily  . Chlorhexidine Gluconate Cloth  6 each Topical Q0600  . clopidogrel  75 mg Oral Q breakfast  . lisinopril  10 mg Oral Daily  . mupirocin ointment  1 application Nasal BID  . pantoprazole  40 mg Oral Daily  . pravastatin  20 mg Oral q1800  . sucralfate  1 g Oral QID     Infusions:  PRN Medications:  acetaminophen, LORazepam, nitroGLYCERIN, ondansetron (ZOFRAN) IV   Assessment and Plan:  39 y/o M w/ PMHx of HTN, anxiety, GERD, HLD, and depression, admitted for chest pain.   NSTEMI: Patient w/ complaints of chest pain and mild troponin elevation. Went for cardiac catheterization yesterday, showed severe aneurysmal dilatation of the RCA, LAD and LCx without flow limiting lesions. Also showed normal LV systolic function. Started on ASA + Plavix given risk of thrombus formation in the future given his aneurysmal CAD. Troponin trend as follows:    Recent Labs Lab 02/20/14 1900 02/21/14 0138 02/21/14 0930 02/21/14 1350  TROPONINI 1.18* 0.68* 0.57* 0.51*  Suspect that chest pain is associated w/ GERD vs anxiety  rather than cardiogenic at this time.  -Continue ASA + Plavix -Continue Lisinopril 10 mg daily -NTG prn -Restarted Pravachol 20 mg qhs; apparently had previous LFT abnormalities on Crestor and apparent hives w/ atorvastatin. Will try Pravachol for now given lipid panel w/ LDL of 182.  -Protonix 40 mg daily -Can continue Carafate, however, would not take w/ other medications such as Plavix, etc.  -Patient has been set up for GI outpatient appointment w/ [facility] for 03/03/14 at 1:00 PM  Tobacco Abuse: States he would like to quit.  -Nicoderm CQ 21 mg patch q24h -Would continue patches for now w/ close follow up as outpatient -Will need further evaluation by PCP for other modalities for quitting if patches do not work  HLD: Lipid panel shows LDL of 182 HDL 29, total cholesterol 098240. Triglycerides 145.  -Pravachol as above -Will need repeat LFT's as an outpatient w/ PCP while on statin given his history (claims he may have had elevated transaminases while on Crestor)  HTN: Stable.  -Lisinopril as above    Lauris ChromanWoody Jones, MD PGY-2 Internal Medicine Pager: [phone]  I have examined the patient and reviewed assessment and plan and discussed with patient.  Agree with above as stated.  No severe CAD.  Diffuse ectatic segments in the right and left systems.  Started Plavix.  Start Pravachol since he had increased LFTs on Crestor.  GI eval set up.  He wants new PMD as well, [facility]  Cardiology f/u with Dr. Jens Somrenshaw.   Markes Shatswell S.

## 2014-02-22 NOTE — Discharge Summary (Addendum)
Discharge Summary   Patient ID: Jason Morris MRN: [id number], DOB/AGE: [birth year] 39 y.o. Admit date: 02/20/2014 D/C date:     02/22/2014  Primary Cardiologist: Dr. Jens Som  Principal Problem:   NSTEMI (non-ST elevated myocardial infarction) Active Problems:   GERD (gastroesophageal reflux disease)   GAD (generalized anxiety disorder)   Essential hypertension   Anxiety   Hyperlipidemia   Tobacco abuse   CAD (coronary artery disease)    Admission Dates: 02/20/14- 02/22/14 Discharge Diagnosis: NSTEMI s/p LHC with severe aneurysmal dilatation of the RCA, LAD and LCx without flow limiting lesions. Treated medically.   HPI: Jason Morris is a 39 y.o. male with a history of HTN, anxiety, GERD, HLD, and depression, admitted to [facility] on 02/20/14 with chest pain.   Jason Morris is a 39 year old male with multiple issues. 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 Cardiac CT in 08/2012 with aneurysmal dilatation of the RCA, 2 serial 70% lesions in the proximal RCA and 50% in the PD. CA score of 3557. Other issues include HTN, ongoing tobacco abuse, HLD, anxiety, migraines, chronic low back pain, & depression.  Echo 8/14 showed normal LV function and mild LAE. Myovue was ordered previously to evaluate significance of RCA disease but he did not get it done and also did not have renal dopplers or catecholamines/metanephrines checked. He was seen at [facility] with what is described as multiple complaints including chest pain. Troponin negative. Liver functions normal. Hemoglobin normal. Chest x-ray with no active disease. He was seen by Dr. Jens Som 10/27 with some chest discomfort that increased with certain movements and a chest burning that improved with Zantac. A nuclear stress test was ordered that showed no ischemia.   He presented to [facility] ER on 02/20/14 with complaints of chest pain in the center with radiation to the left off and on for 2  weeks. It also radiated to the back side of the left arm and back. Apparently the last time he had this pain he had had bronchitis. He did get some relief with Carafate and Zantac in the ER. Troponin elevated at 1.18 so he was transferred to [facility] for further evaluation and treatment.  Hospital Course  NSTEMI: Patient w/ complaints of chest pain and mild troponin elevation. Underwent urgernt cardiac catheterization on 02/21/14 due to ongoing, recalcitrant chest pain. This showed severe aneurysmal dilatation of the RCA, LAD and LCx without flow limiting lesions. Also showed normal LV systolic function. Started on ASA + Plavix given risk of thrombus formation in the future given his aneurysmal CAD.  -- Troponin trend as follows:1.18--> 0.68--> 0.57--> 0.51 -- Suspect that chest pain is associated w/ GERD vs anxiety rather than cardiogenic at this time.  -- Continue ASA + Plavix -- Continue Lisinopril 10 mg daily -- NTG prn -- Restarted Pravachol 20 mg qhs; apparently had previous LFT abnormalities on Crestor and apparent hives w/ atorvastatin. Will try Pravachol for now given lipid panel w/ LDL of 182.  -- Protonix 40 mg daily -- Can continue Carafate, however, would not take w/ other medications such as Plavix, etc.  -- Patient has been set up for GI outpatient appointment w/ [facility] for 03/03/14 at 1:00 PM  Tobacco Abuse: States he would like to quit.  -- Nicoderm CQ 21 mg patch q24h -- Would continue patches for now w/ close follow up as outpatient -- Will need further evaluation by PCP for other modalities for  quitting if patches do not work  HLD: Lipid panel shows LDL of 182 HDL 29, total cholesterol 161240. Triglycerides 145.  -- Pravachol as above -- Will need repeat LFT's as an outpatient w/ PCP while on statin given his history (claims he may have had elevated transaminases while on Crestor)  HTN: Stable.  -- Lisinopril as above   The patient has had an uncomplicated hospital  course and is recovering well. The radial catheter site is stable. He has been seen by Dr. Eldridge DaceVaranasi today and deemed ready for discharge home. All follow-up appointments have been scheduled. Smoking cessation was disscussed in length. A written RX for a 30 day free supply of Brilinta was provided for the patient. A work excuse note was provided as well. Discharge medications are listed below.   Discharge Vitals: Blood pressure 132/72, pulse 62, temperature 97.9 F (36.6 C), temperature source Oral, resp. rate 18, height 5' 11.5" (1.816 m), weight 261 lb 11 oz (118.7 kg), SpO2 97 %.  Labs: Lab Results  Component Value Date   WBC 7.3 02/22/2014   HGB 14.5 02/22/2014   HCT 43.7 02/22/2014   MCV 88.1 02/22/2014   PLT 207 02/22/2014     Recent Labs Lab 02/21/14 0138 02/21/14 0930  NA 135 136  K 3.7 4.0  CL 101 102  CO2 29 26  BUN 9 8  CREATININE 1.02 1.04  CALCIUM 9.0 9.6  PROT 6.7  --   BILITOT 1.1  --   ALKPHOS 45  --   ALT 26  --   AST 28  --   GLUCOSE 106* 95    Recent Labs  02/20/14 1900 02/21/14 0138 02/21/14 0930 02/21/14 1350  TROPONINI 1.18* 0.68* 0.57* 0.51*   Lab Results  Component Value Date   CHOL 240* 02/21/2014   HDL 29* 02/21/2014   LDLCALC 182* 02/21/2014   TRIG 145 02/21/2014   Lab Results  Component Value Date   DDIMER <0.27 07/10/2012    Diagnostic Studies/Procedures   Dg Chest 2 View  02/20/2014   CLINICAL DATA:  39 year old male with mid to left chest pain for 2 weeks. Productive cough, bloody sputum. Initial encounter.  EXAM: CHEST  2 VIEW  COMPARISON:  Chest CTA 08/08/2012 and earlier  FINDINGS: Stable cardiomegaly. Other mediastinal contours are within normal limits. Visualized tracheal air column is within normal limits. The lungs are clear. No pneumothorax or effusion. No acute osseous abnormality identified.  IMPRESSION: No active cardiopulmonary disease.   Electronically Signed   By: Odessa FlemingH  Hall M.D.   On: 02/20/2014 19:59    02/21/14 Cardiac Catheterization Operative Report Procedure Performed:  1. Left Heart Catheterization 2. Selective Coronary Angiography 3. Left ventricular angiogram  Hemodynamic Findings: Central aortic pressure: 106/68 Left ventricular pressure: 108/4/9  Left Ventricular Angiogram: LVEF=60-65%.  Impression: 1. Severe aneurysmal dilatation of the RCA, LAD and Circumflex without flow limiting lesions 2. Normal LV systolic function 3. Minimally elevated troponin, now trending down Recommendations: I have loaded with Plavix 600 mg in the cath lab. I would continue ASA and Plavix for lifetime given risk of thrombus formation with aneurysmal CAD. Tobacco cessation. Consider further workup for vasculitis/intracerebral arterial disease.    Discharge Medications     Medication List    STOP taking these medications        lisinopril-hydrochlorothiazide 20-25 MG per tablet  Commonly known as:  PRINZIDE,ZESTORETIC     ranitidine 150 MG tablet  Commonly known as:  ZANTAC     rosuvastatin  40 MG tablet  Commonly known as:  CRESTOR      TAKE these medications        acetaminophen 500 MG tablet  Commonly known as:  TYLENOL  Take 500 mg by mouth every 8 (eight) hours as needed for mild pain.     aspirin EC 81 MG tablet  Take 1 tablet (81 mg total) by mouth daily.     ATIVAN 1 MG tablet  Generic drug:  LORazepam  Take 1 mg by mouth 3 (three) times daily as needed for anxiety.     clopidogrel 75 MG tablet  Commonly known as:  PLAVIX  Take 1 tablet (75 mg total) by mouth daily with breakfast.     dexlansoprazole 60 MG capsule  Commonly known as:  DEXILANT  Take 60 mg by mouth daily.     lisinopril 10 MG tablet  Commonly known as:  PRINIVIL,ZESTRIL  Take 1 tablet (10 mg total) by mouth daily.     multivitamin tablet  Take 1 tablet by mouth daily.     nicotine 21 mg/24hr patch  Commonly known as:  NICODERM CQ  Place 1 patch (21 mg total) onto the skin daily.      nitroGLYCERIN 0.4 MG SL tablet  Commonly known as:  NITROSTAT  Place 1 tablet (0.4 mg total) under the tongue every 5 (five) minutes x 3 doses as needed for chest pain.     pantoprazole 40 MG tablet  Commonly known as:  PROTONIX  Take 1 tablet (40 mg total) by mouth daily.     pravastatin 20 MG tablet  Commonly known as:  PRAVACHOL  Take 1 tablet (20 mg total) by mouth daily at 6 PM.     sucralfate 1 G tablet  Commonly known as:  CARAFATE  Take 1 g by mouth 4 (four) times daily.        Disposition   The patient will be discharged in stable condition to home.  Follow-up Information    Follow up with Celso Amy, PA-C On 03/03/2014.   Specialty:  Physician Assistant   Why:  1:00 PM   Contact information:   [address] [place] [address] [phone]       Follow up with [facility]    .   Contact information:   [address] [place] [address] [phone]      Follow up with Tereso Newcomer, PA-C On 03/11/2014.   Specialty:  Physician Assistant   Why:  @ 2:40 PM   Contact information:   [address] [place] [address] [phone]         Duration of Discharge Encounter: Greater than 30 minutes including physician and PA time.  SignedCline Crock R PA-C 02/22/2014, 1:13 PM   I have examined the patient and reviewed assessment and plan and discussed with patient. Agree with above as stated. No severe CAD. Diffuse ectatic segments in the right and left systems. Started Plavix. Start Pravachol since he had increased LFTs on Crestor. GI eval set up.Avoid Chantix due to significant anxiety.  He wants new PMD as well, will [facility]. Cardiology f/u with Dr. Jens Som.   Dawson Albers S.

## 2014-02-22 NOTE — Progress Notes (Signed)
All air removed from TR band on right wrist, No bleeding noted, Band removed 6 hours post procedure. 2x2 dry gauze dressing placed over site. Will continue to monitor closely for bleeding.

## 2014-02-22 NOTE — Care Management Note (Signed)
    Page 1 of 1   02/22/2014     12:55:12 PM CARE MANAGEMENT NOTE 02/22/2014  Patient:  Jason KlinefelterWOOD,Jason G   Account Number:  [id number]  Date Initiated:  02/22/2014  Documentation initiated by:  Junius CreamerWELL,DEBBIE  Subjective/Objective Assessment:   adm w ch pain     Action/Plan:   lives alone, no pcp   Anticipated DC Date:  02/22/2014   Anticipated DC Plan:  HOME/SELF CARE      DC Planning Services  [facility]      Choice offered to / List presented to:             Status of service:   Medicare Important Message given?   (If response is "NO", the following Medicare IM given date fields will be blank) Date Medicare IM given:   Medicare IM given by:   Date Additional Medicare IM given:   Additional Medicare IM given by:    Discharge Disposition:  HOME/SELF CARE  Per UR Regulation:  Reviewed for med. necessity/level of care/duration of stay  If discussed at Long Length of Stay Meetings, dates discussed:    Comments:  2/15 1253 debbie Asal Teas rn,bsn spoke w pt. he has [employer] ins. he is changing pcp's. did give him inform on [facility] and [facility]. he can ck w cust service w [facility] to get preferred list of pcp's also.

## 2014-02-22 NOTE — Discharge Instructions (Signed)

## 2014-02-22 NOTE — Progress Notes (Signed)
Went over discharge instructions, medications and follow up appointments with patient. Patient had no additional questions or concerns related to discharge instructions. IV d/'d, patient taken off the cardiac monitor. Patient discharged home. No home needs.   Rise PaganiniURRY, Rhia Blatchford R, RN

## 2014-02-22 NOTE — Telephone Encounter (Signed)
New Msg          Pt has TCM appt on 03/11/14 with Tereso NewcomerScott Weaver per Bary CastillaKaty Thompson.

## 2014-02-23 ENCOUNTER — Telehealth: Payer: Self-pay | Admitting: Cardiology

## 2014-02-23 NOTE — Telephone Encounter (Signed)
Returned call to patient.He stated he was in hospital recently with chest pain.Stated he had a cath.Stated he never understood what happened in hospital.03/03/14 discharge summary Non ST Elevated MI.Stated he is anxious and would like a sooner appointment.Post hospital appointment scheduled with Dr.Crenshaw 03/03/14 at 12:15 pm.Stated he would also [facility].Stated he would like to know if he has any restrictions.Stated he is not clear with discharge instructions.Message sent to Deliah Goodyebra Mathis RN.

## 2014-02-23 NOTE — Telephone Encounter (Signed)
Received a call from patient.He drove to his mother's home she was on phone with patient.Patient does not want to go to ER,has been having pain between shoulder blades and chest pain for the past 1 hour.Stated they were never told pt had a MI.Stated he would like a call from [employer].Crenshaw's nurse Debra.Advised to take NTG Sl and if no relief he needs to [facility].Message sent to Deliah Goodyebra Mathis RN.

## 2014-02-23 NOTE — Telephone Encounter (Signed)
Jason Morris is calling because he has some questions about what happen with his heart and he is still having spikes of pain (mild) in his chest . Please call    Thanks

## 2014-02-23 NOTE — Telephone Encounter (Signed)
Received a call from patient he stated he is having pain between shoulder blades and chest pain for the past hour.Advised needs to go [facility].

## 2014-02-23 NOTE — Telephone Encounter (Addendum)
Spoke with pt, he needs to know if he can go back to work.  he is also questioning about if he had an MI or not. Will forward for dr Jens Somcrenshaw review`

## 2014-02-23 NOTE — Telephone Encounter (Signed)
Pt called in wanting some medical advise what he should do, he says that for the past hour he has been having some pain in his back in between his shoulder blades. He is denying any other symptoms at this time. Please f/u  Thanks

## 2014-02-24 ENCOUNTER — Encounter: Payer: Self-pay | Admitting: *Deleted

## 2014-02-24 NOTE — Telephone Encounter (Signed)
Ok to return to work and fu as scheduled; troponin mildly elevated at last admission but [facility] obstructive [facility]

## 2014-02-24 NOTE — Telephone Encounter (Signed)
Unable to reach pt or leave a message  

## 2014-02-24 NOTE — Telephone Encounter (Signed)
Spoke with pt, Aware of dr Ludwig Clarkscrenshaw's recommendations.  Letter will be faxed to patient's employer at [phone].

## 2014-03-02 NOTE — Progress Notes (Signed)
HPI: FU CAD. 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 Cardiac CT 8/14 showed aneurysmal dilatation of RCA, 2 serial 70 percent lesions in prox RCA and 50 PDA. CA score 3557. Echo 8/14 showed normal LV function and mild LAE. Renal Dopplers November 2015 showed 1-59% bilateral stenosis. Nuclear study November 2015 showed ejection fraction 51% and normal perfusion. Admitted in February 2016 with chest pain. Troponin mildly increased (1.18). Cardiac catheterization February 2016 showed an ejection fraction of 60-65%. 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 Patient treated with aspirin and Plavix. Since last seen, he denies dyspnea, palpitations or syncope. He has burning in his chest after eating.  Current Outpatient Prescriptions  Medication Sig Dispense Refill  . acetaminophen (TYLENOL) 500 MG tablet Take 500 mg by mouth every 8 (eight) hours as needed for mild pain.     Marland Kitchen. aspirin EC 81 MG tablet Take 1 tablet (81 mg total) by mouth daily. 90 tablet 3  . clopidogrel (PLAVIX) 75 MG tablet Take 1 tablet (75 mg total) by mouth daily with breakfast. 30 tablet 11  . lisinopril (PRINIVIL,ZESTRIL) 10 MG tablet Take 1 tablet (10 mg total) by mouth daily. 30 tablet 11  . LORazepam (ATIVAN) 1 MG tablet Take 1 mg by mouth 3 (three) times daily as needed for anxiety.     . Multiple Vitamin (MULTIVITAMIN) tablet Take 1 tablet by mouth daily.    . nitroGLYCERIN (NITROSTAT) 0.4 MG SL tablet Place 1 tablet (0.4 mg total) under the tongue every 5 (five) minutes x 3 doses as needed for chest pain. 25 tablet 12  . pantoprazole (PROTONIX) 40 MG tablet Take 1 tablet (40 mg total) by mouth daily. 30 tablet 11  . pravastatin (PRAVACHOL) 20 MG tablet Take 1 tablet (20 mg total) by mouth daily at 6 PM. 30 tablet 11  . sucralfate (CARAFATE) 1 G tablet Take 1 g by mouth 4 (four) times daily.    .  [DISCONTINUED] atorvastatin (LIPITOR) 80 MG tablet Take 1 tablet (80 mg total) by mouth daily at 6 PM. 30 tablet 5   No current facility-administered medications for this visit.     Past Medical History  Diagnosis Date  . Hypertension   . Anxiety   . GERD (gastroesophageal reflux disease)   . Migraines   . Chronic lower back pain   . Depression   . HLD (hyperlipidemia)   . CAD (coronary artery disease)     a. Chest CTA 8/14 with coronary Ca and RCA aneurysmal dilatation    b. NSTEMI s/p LHC severe aneurysmal dilatation of the RCA, LAD and LCx without flow limiting lesions.  . Tobacco abuse     Past Surgical History  Procedure Laterality Date  . Myringotomy with tube placement Bilateral     "4-5 times as a child" (08/09/2012)  . Left heart cath N/A 02/21/2014    Procedure: LEFT HEART CATH;  Surgeon: Kathleene Hazelhristopher D McAlhany, MD;  [facility];  Service: Cardiovascular;  Laterality: N/A;    History   Social History  . Marital Status: Divorced    Spouse Name: N/A  . Number of Children: N/A  . Years of Education: N/A   Occupational History  . Not on file.   Social History Main Topics  . Smoking status: Heavy Tobacco Smoker -- 0.50 packs/day for 17 years    Types: Cigarettes  . Smokeless tobacco: Never  Used     Comment: 08/09/2012 "smoked 3 ppd til 1 month ago"   . Alcohol Use: No     Comment: 08/09/2012 "had a mixed drink once in the past year"  . Drug Use: No  . Sexual Activity: Yes   Other Topics Concern  . Not on file   Social History Narrative    ROS: anxiety but no fevers or chills, productive cough, hemoptysis, dysphasia, odynophagia, melena, hematochezia, dysuria, hematuria, rash, seizure activity, orthopnea, PND, pedal edema, claudication. Remaining systems are negative.  Physical Exam: Well-developed well-nourished in no acute distress.  Skin is warm and dry.  HEENT is normal.  Neck is supple.  Chest is clear to auscultation with normal expansion.    Cardiovascular exam is regular rate and rhythm.  Abdominal exam nontender or distended. No masses palpated. Extremities show no edema. neuro grossly intact  ECG sinus rhythm at a rate of 74. Nonspecific inferior T-wave changes.

## 2014-03-03 ENCOUNTER — Encounter: Payer: Self-pay | Admitting: Cardiology

## 2014-03-03 ENCOUNTER — Ambulatory Visit (INDEPENDENT_AMBULATORY_CARE_PROVIDER_SITE_OTHER): Payer: 59 | Admitting: Cardiology

## 2014-03-03 VITALS — BP 141/84 | HR 74 | Ht 71.5 in | Wt 263.2 lb

## 2014-03-03 DIAGNOSIS — F419 Anxiety disorder, unspecified: Secondary | ICD-10-CM

## 2014-03-03 DIAGNOSIS — Z720 Tobacco use: Secondary | ICD-10-CM

## 2014-03-03 DIAGNOSIS — R079 Chest pain, unspecified: Secondary | ICD-10-CM

## 2014-03-03 DIAGNOSIS — I10 Essential (primary) hypertension: Secondary | ICD-10-CM

## 2014-03-03 DIAGNOSIS — E785 Hyperlipidemia, unspecified: Secondary | ICD-10-CM

## 2014-03-03 DIAGNOSIS — I2583 Coronary atherosclerosis due to lipid rich plaque: Secondary | ICD-10-CM

## 2014-03-03 DIAGNOSIS — I2510 Atherosclerotic heart disease of native coronary artery without angina pectoris: Secondary | ICD-10-CM

## 2014-03-03 IMAGING — CT CT HEAD W/O CM
1 series · 16 of 30 positions shown, 20 images · non-contrast
Comparison: None.

CLINICAL DATA: Headache, weakness, anxious

CT HEAD WITHOUT CONTRAST
TECHNIQUE: Contiguous axial images were obtained from the base of
the skull through the vertex without contrast.

[Series 2: head 5.0 h30s · axial · 0.50mm/px · z∈[+1073,+1223]mm · 16 of 34 slices shown, 20 images]
[im 2/34  brain]
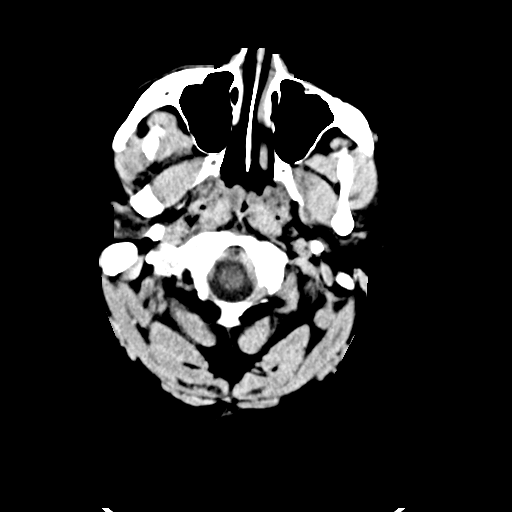
[im 2/34  bone]
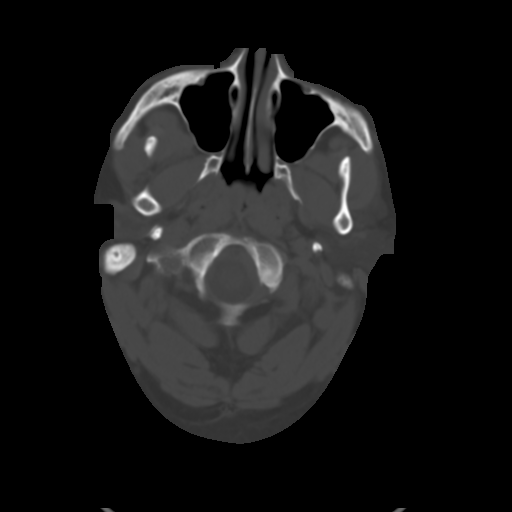
[im 4/34  brain]
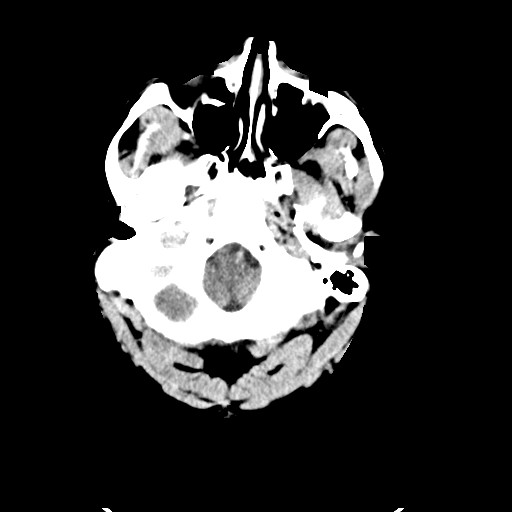
[im 6/34  brain]
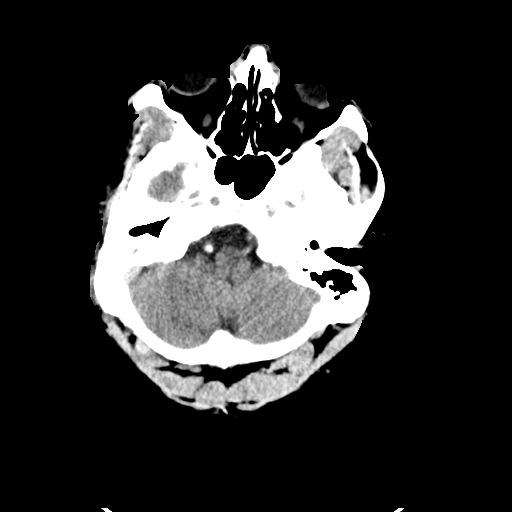
[im 8/34  brain]
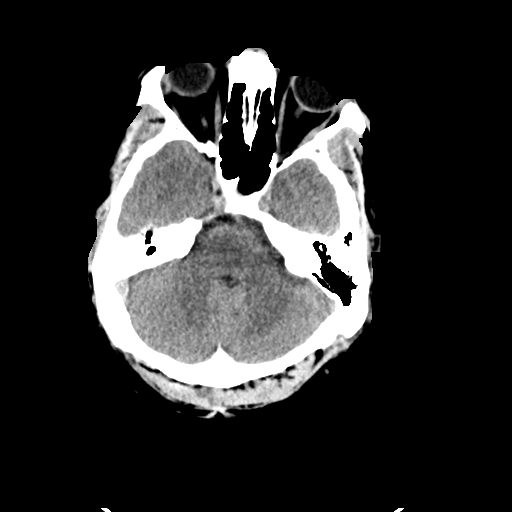
[im 10/34  brain]
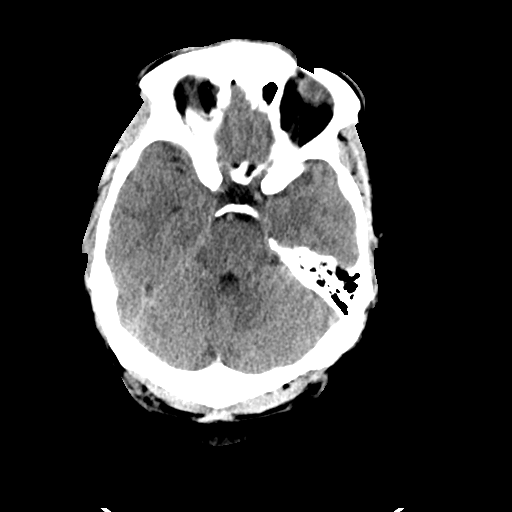
[im 10/34  bone]
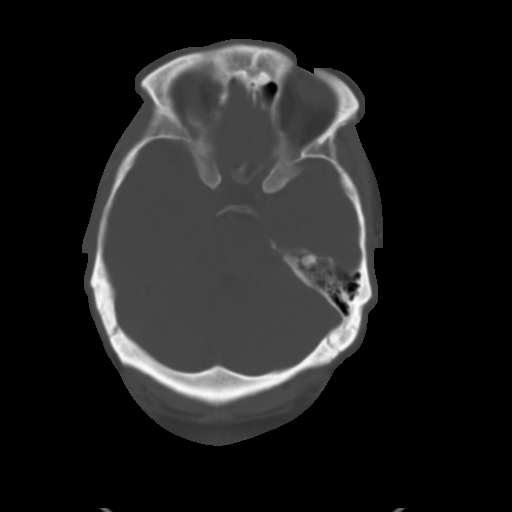
[im 12/34  brain]
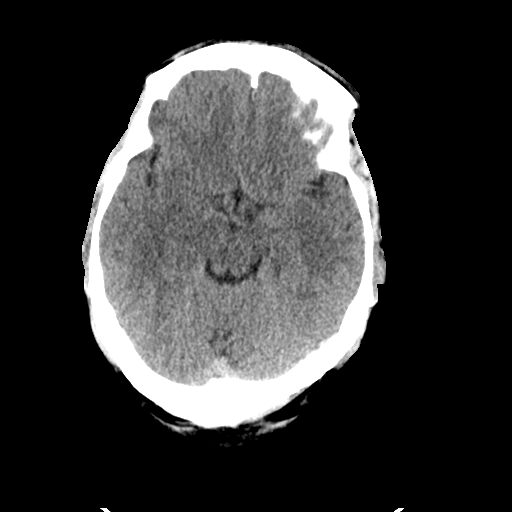
[im 14/34  brain]
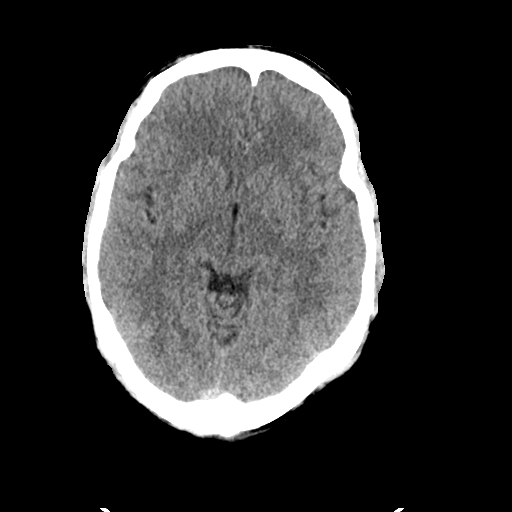
[im 16/34  brain]
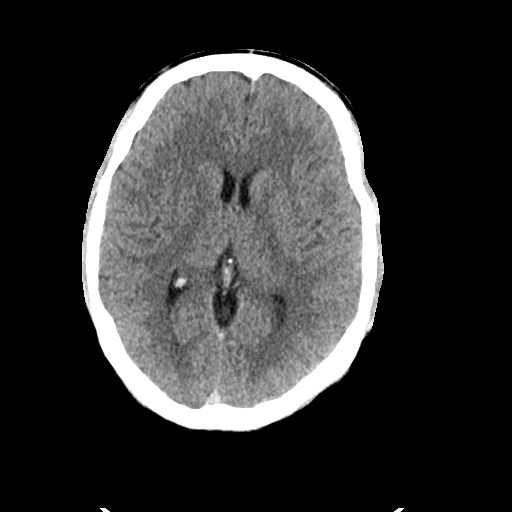
[im 18/34  brain]
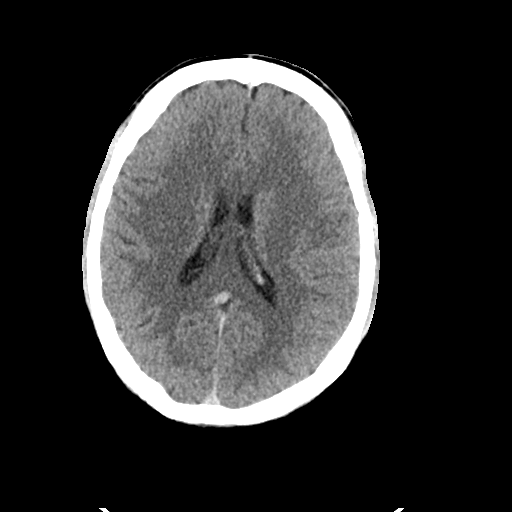
[im 18/34  bone]
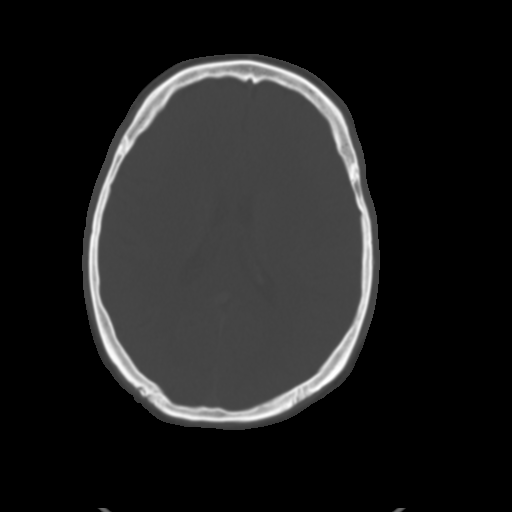
[im 20/34  brain]
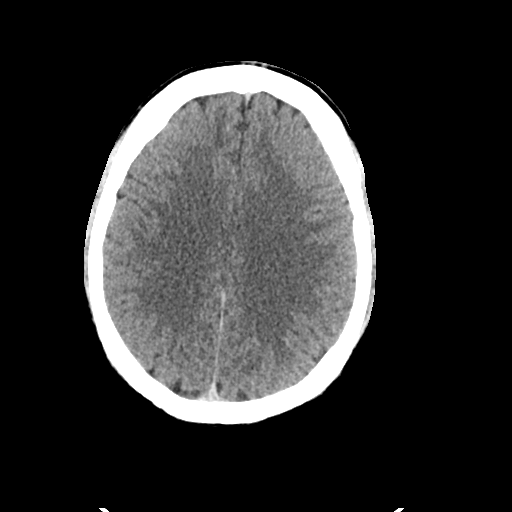
[im 22/34  brain]
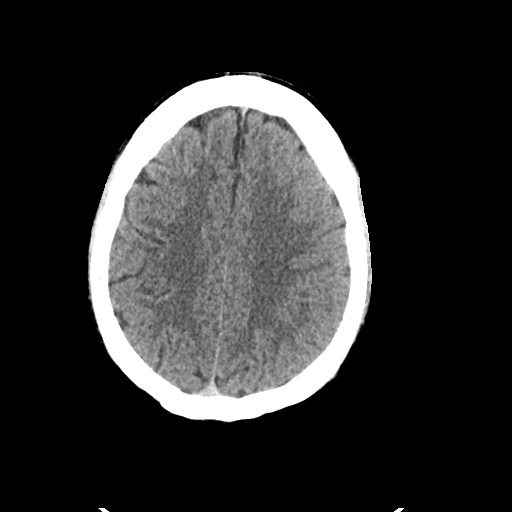
[im 24/34  brain]
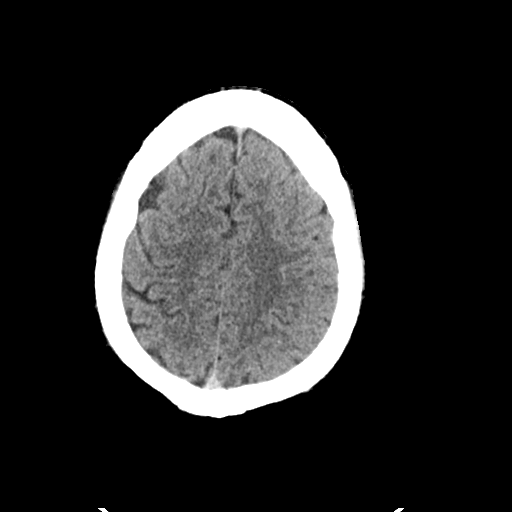
[im 26/34  brain]
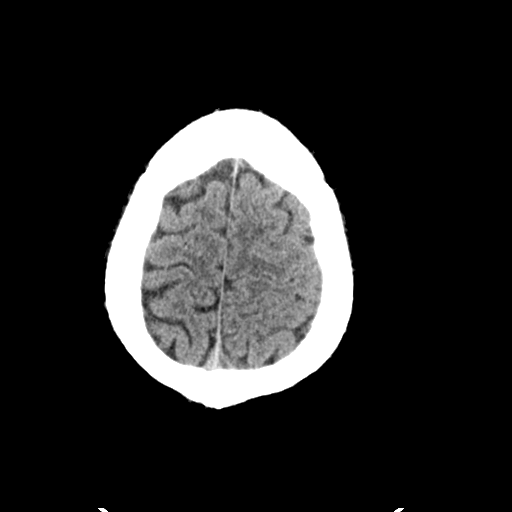
[im 26/34  bone]
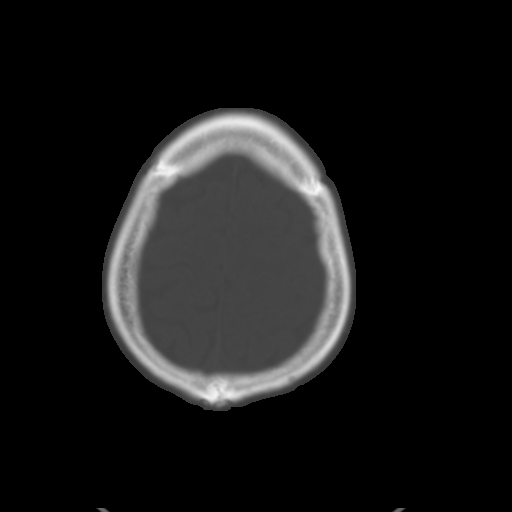
[im 28/34  brain]
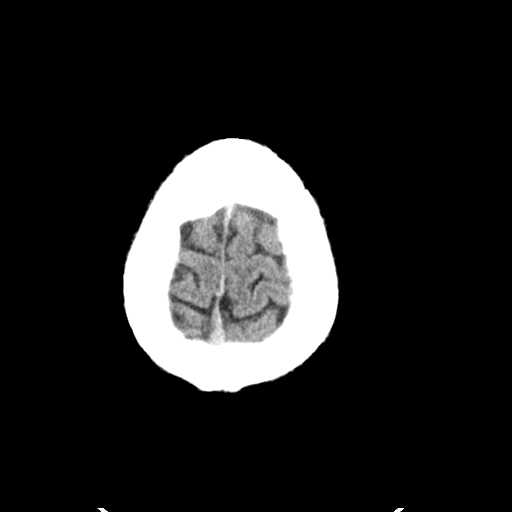
[im 30/34  brain]
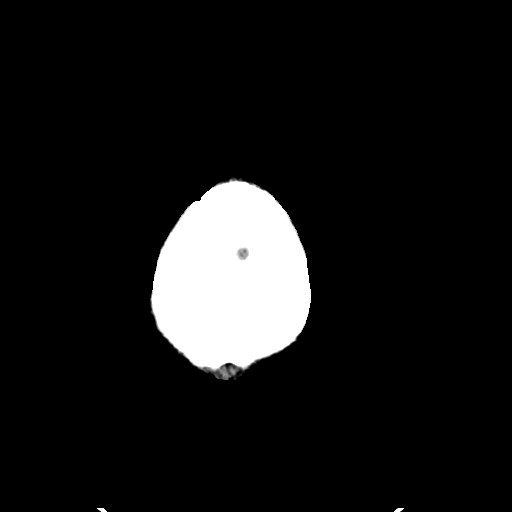
[im 32/34  brain]
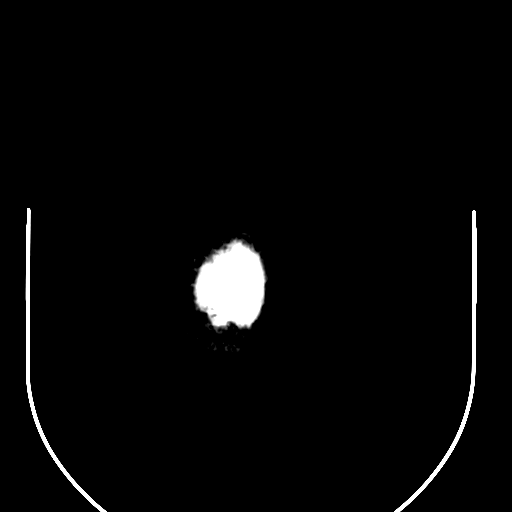

[16 of 30 positions shown; findings below may reference images not displayed]

FINDINGS: No evidence of parenchymal hemorrhage or extra-axial
fluid collection. No mass lesion, mass effect, or midline shift.

No CT evidence of acute infarction.

Cerebral volume is age appropriate.  No ventriculomegaly.

The visualized paranasal sinuses are essentially clear. The mastoid
air cells are unopacified.

No evidence of calvarial fracture.
IMPRESSION: Normal head CT.

## 2014-03-03 IMAGING — CR DG CHEST 2V
2 series · 2 of 2 positions shown · non-contrast
Comparison: 07/10/2012

CLINICAL DATA: Chest pain

CHEST - 2 VIEW

[w chest pa]
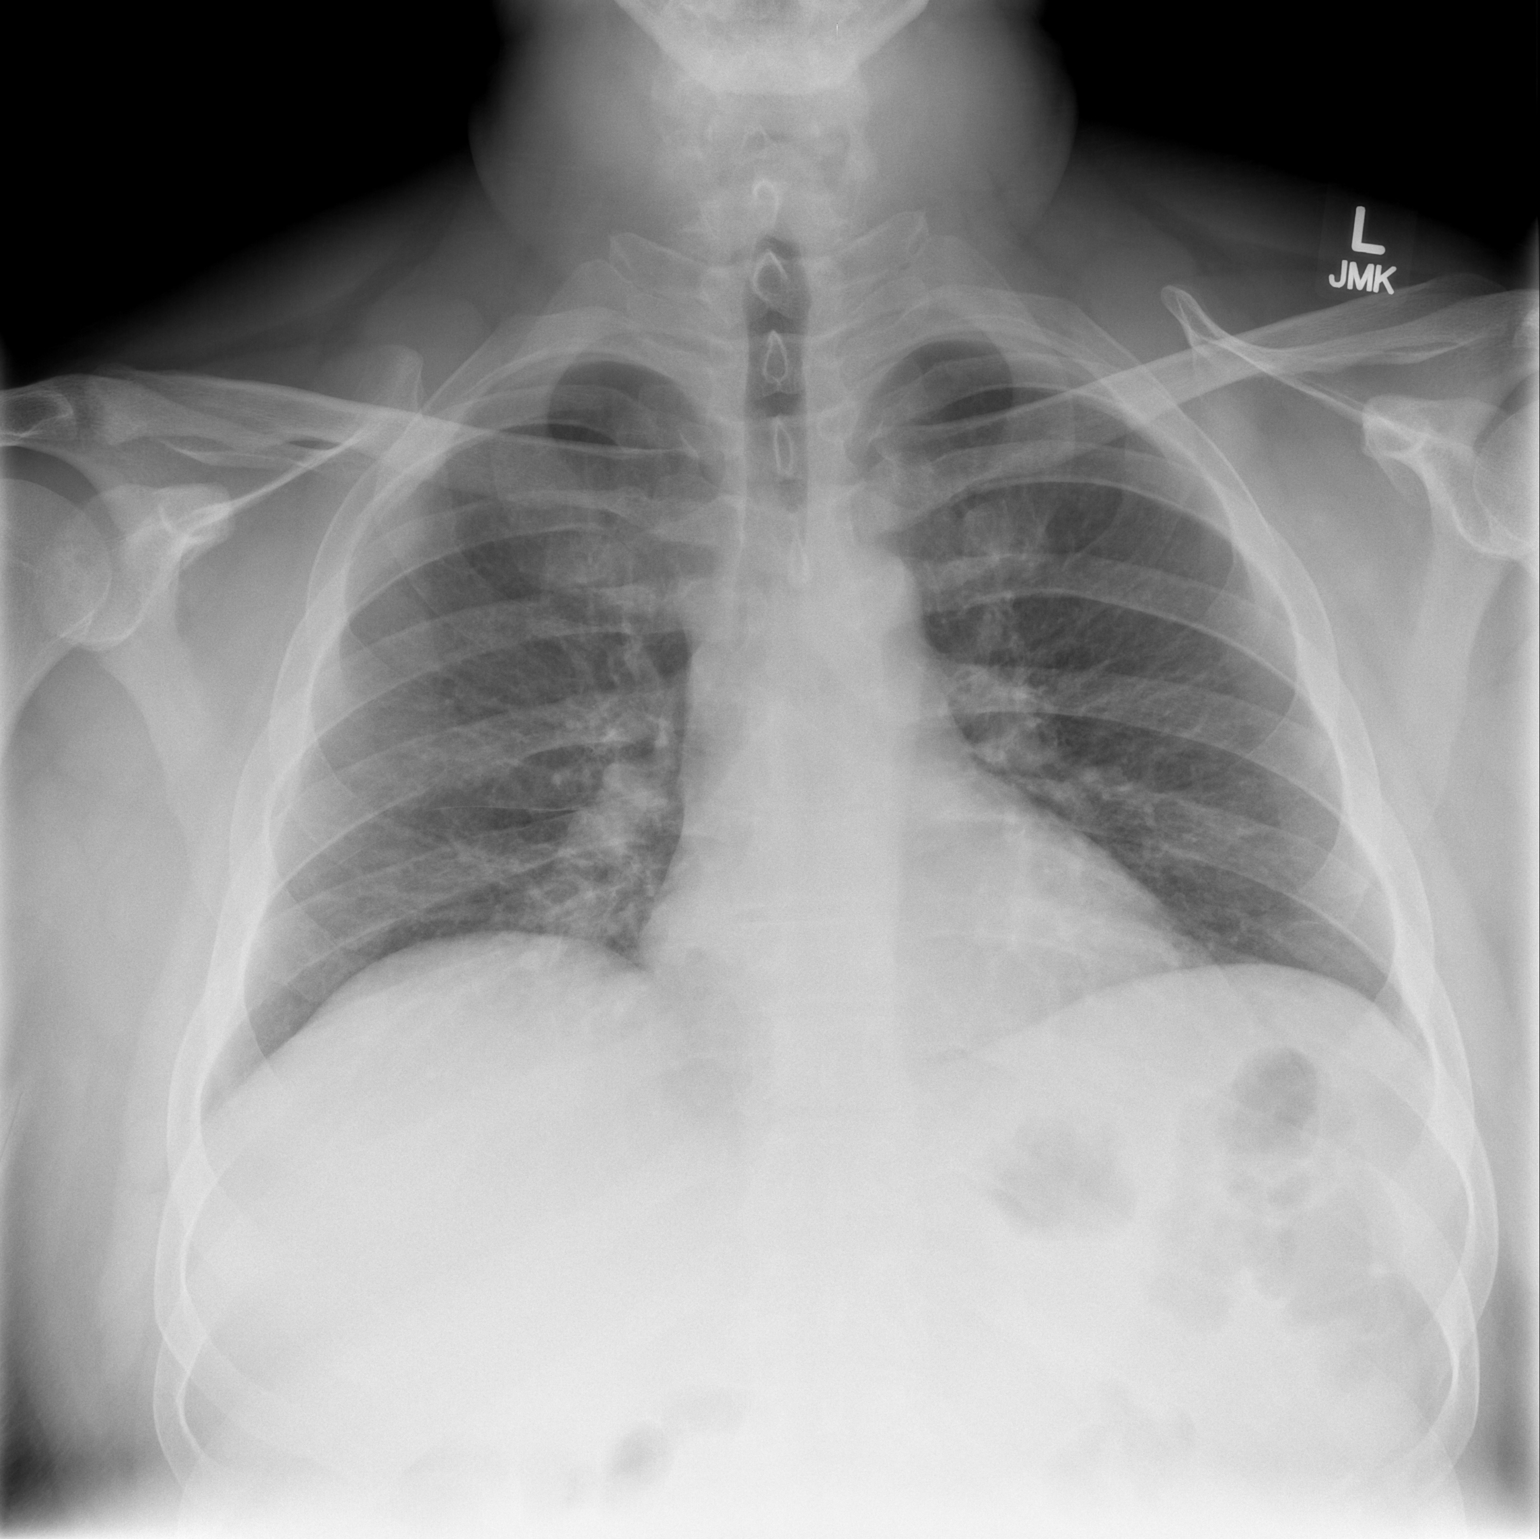

[w chest lat]
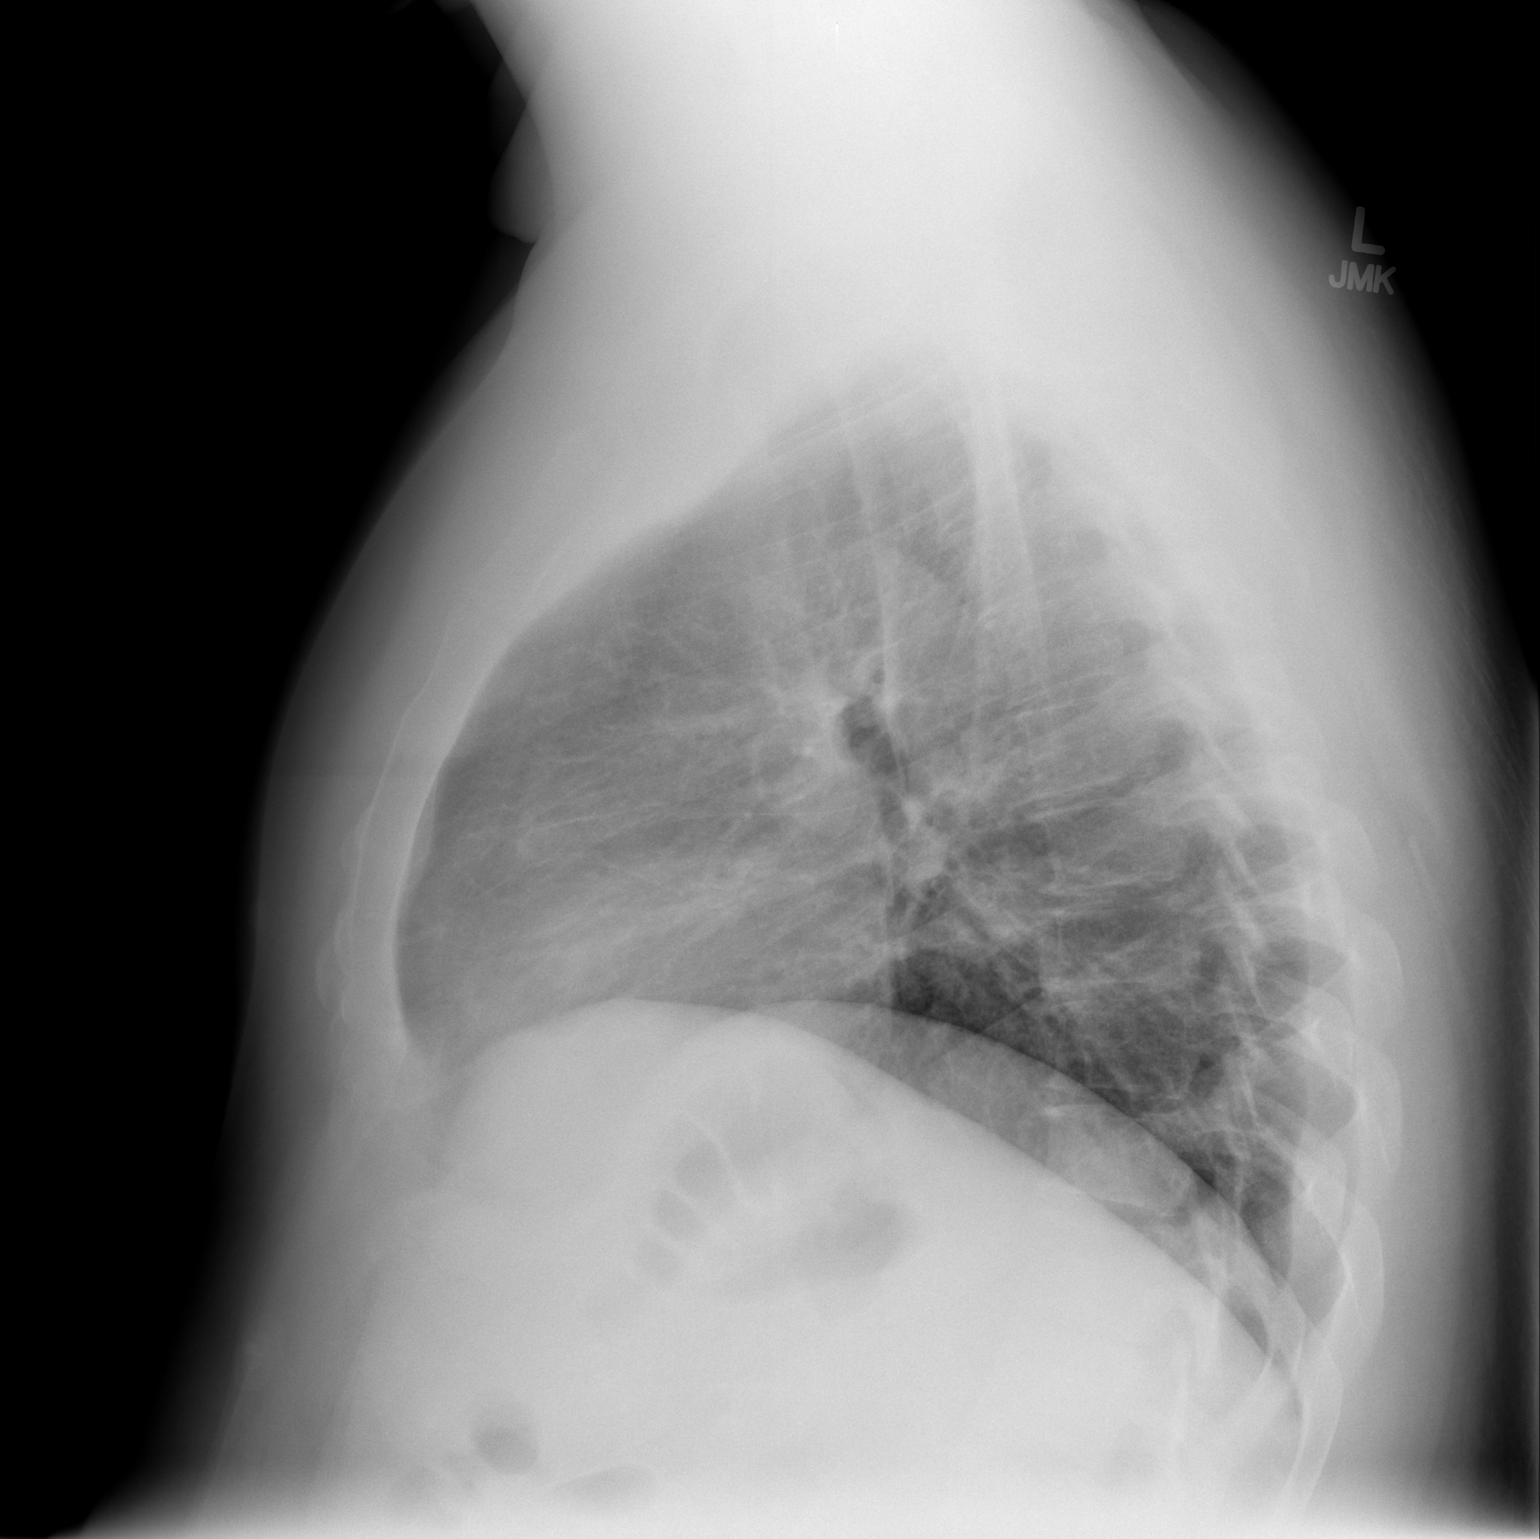

[2 of 2 positions shown; findings below may reference images not displayed]

FINDINGS: Low lung volumes.  No focal consolidation.  No pleural
effusion or pneumothorax.

The heart is normal in size.

Mild degenerative changes of the visualized thoracolumbar spine.
IMPRESSION: No evidence of acute cardiopulmonary disease.

## 2014-03-03 MED ORDER — LISINOPRIL 20 MG PO TABS
20.0000 mg | ORAL_TABLET | Freq: Every day | ORAL | Status: DC
Start: 2014-03-03 — End: 2015-12-12

## 2014-03-03 NOTE — Assessment & Plan Note (Signed)
Patient counseled on discontinuing. 

## 2014-03-03 NOTE — Assessment & Plan Note (Signed)
Continue statin. 

## 2014-03-03 NOTE — Assessment & Plan Note (Signed)
Severe. I recommended he potentially see psychiatry for assistance with this.

## 2014-03-03 NOTE — Assessment & Plan Note (Signed)
Blood pressure elevated. Increase lisinopril to 20 mg daily. Check potassium and renal function in 1 week. 

## 2014-03-03 NOTE — Assessment & Plan Note (Signed)
Patient was noted to have no significant obstructive disease at the time of previous catheterization but he had aneurysmal dilatation of his coronaries. He does not know of a history of Kawasaki's. His troponin was mildly elevated. Question related to slow flow or thrombus formation in the coronaries. We will treat with aspirin, Plavix and statin. I will check sedimentation rate, rheumatoid factor and ANA to screen for vasculitis.

## 2014-03-03 NOTE — Patient Instructions (Signed)
Your physician recommends that you schedule a follow-up appointment in: 3 MONTHS WITH DR CRENSHAW  INCREASE LISINOPRIL TO 20 MG ONCE DAILY = 2 OF THE 10 MG TABLETS ONCE DAILY  Your physician recommends that you return for lab work in: ONE WEEK

## 2014-03-11 ENCOUNTER — Encounter: Payer: 59 | Admitting: Physician Assistant

## 2014-03-20 LAB — BASIC METABOLIC PANEL WITH GFR
BUN: 10 mg/dL (ref 6–23)
CO2: 22 mEq/L (ref 19–32)
Calcium: 9.4 mg/dL (ref 8.4–10.5)
Chloride: 102 mEq/L (ref 96–112)
Creat: 0.95 mg/dL (ref 0.50–1.35)
GFR, Est African American: >89 mL/min
Glucose, Bld: 109 mg/dL — ABNORMAL HIGH (ref 70–99)
Potassium: 4 mEq/L (ref 3.5–5.3)
Sodium: 137 mEq/L (ref 135–145)

## 2014-03-20 LAB — SEDIMENTATION RATE: SED RATE: 6 mm/h (ref 0–15)

## 2014-03-20 LAB — RHEUMATOID FACTOR: Rhuematoid fact SerPl-aCnc: <10 IU/mL (ref <=14)

## 2014-03-23 ENCOUNTER — Telehealth (HOSPITAL_COMMUNITY): Payer: Self-pay | Admitting: Cardiology

## 2014-03-23 ENCOUNTER — Telehealth: Payer: Self-pay | Admitting: Cardiology

## 2014-03-23 LAB — ANA: Anti Nuclear Antibody(ANA): NEGATIVE

## 2014-03-23 NOTE — Telephone Encounter (Signed)
[facility] for appointment on 06/01/14 with Dr Jens Somrenshaw.  Records given to [facility] LPN Hines (medical records) for Dr Ludwig Clarksrenshaw's schedule on 06/01/14. lp

## 2014-03-23 NOTE — Telephone Encounter (Signed)
Spoke with pt, Aware of dr crenshaw's recommendations.  °

## 2014-03-23 NOTE — Telephone Encounter (Signed)
Will forward for dr crenshaw review  

## 2014-03-23 NOTE — Telephone Encounter (Signed)
Ok to hold plavix prior to procedure. Olga MillersBrian Zekiah Coen

## 2014-03-23 NOTE — Telephone Encounter (Signed)
Pt need permission to stop his Plavix for GI procedure and is it all right for him to be put to sleep? Also would like his lab results from 03-19-14 please.

## 2014-03-26 ENCOUNTER — Telehealth: Payer: Self-pay | Admitting: *Deleted

## 2014-03-26 NOTE — Telephone Encounter (Signed)
Clearance for colonoscopy with the okay to hold plavix 4 days prior to procedure faxed.

## 2014-04-05 ENCOUNTER — Encounter: Payer: Self-pay | Admitting: *Deleted

## 2014-04-06 ENCOUNTER — Telehealth: Payer: Self-pay | Admitting: Cardiology

## 2014-04-06 NOTE — Telephone Encounter (Signed)
Spoke with amanda, clearance letter re-faxed to [phone].

## 2014-04-06 NOTE — Telephone Encounter (Signed)
Left message for KIM to call

## 2014-04-06 NOTE — Telephone Encounter (Signed)
Jason Morris is calling in stating that a written response is needed to be faxed to their facility stating that Dr. Jens Somrenshaw approves of the pt receiving anesthesia for his Colonoscopy . Please call  Thanks

## 2014-05-28 ENCOUNTER — Telehealth: Payer: Self-pay | Admitting: Cardiology

## 2014-05-28 NOTE — Telephone Encounter (Signed)
Pt called in stating that yesterday he broke into a cold sweat and since then he has had little to no energy. He also is complaining of dizziness, lightheadedness, and cramping in his chest. He would like to come in and be seen today if possible. Please f/u with pt   Thanks

## 2014-05-28 NOTE — Telephone Encounter (Signed)
Discussed w/ patient, instructed to continue meds as directed. Reviewed appt calendar - he has 3 mo f/u w/ Dr. Jens Somrenshaw on Tuesday 5/24. He was okay to keep this appt rather than see PA. Told to call for any concerns in the meantime. Pt verbalized understanding.

## 2014-05-28 NOTE — Telephone Encounter (Signed)
Can fu with PA if needed but otherwise would continue present meds and follow Jason Morris

## 2014-05-28 NOTE — Telephone Encounter (Signed)
Spoke to patient. 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 "Felt kind of puny" for the rest of the day. States unable to perform w/ wife last night.  Notes he feels anxious today though he slept well last night. He has some residual soreness from yesterday. Notes chest "cramping" likely from the manual labor. Denies chest "pain". Denies dyspnea, lightheadedness today.  Informed patient I would route to Dr. Jens Somrenshaw for recommendation. He was just seen in February following a hosp admission for CP.

## 2014-05-31 NOTE — Progress Notes (Signed)
HPI: FU CAD. 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 Cardiac CT 8/14 showed aneurysmal dilatation of RCA, 2 serial 70 percent lesions in prox RCA and 50 PDA. CA score 3557. Echo 8/14 showed normal LV function and mild LAE. Renal Dopplers November 2015 showed 1-59% bilateral stenosis. Nuclear study November 2015 showed ejection fraction 51% and normal perfusion. Admitted in February 2016 with chest pain. Troponin mildly increased (1.18). Cardiac catheterization February 2016 showed an ejection fraction of 60-65%. 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 Patient treated with aspirin and Plavix. Laboratories in March 2016 showed a normal sedimentation rate, normal rheumatoid factor and normal antinuclear antibody. Since last seen, last week the patient had an episode of severe fatigue and diaphoresis. He was seen by his primary care physician and apparently a troponin he continues to have occasional chest pain. He has had chest pain for the past 3 days continuously. It can increase with certain movements. Some improvement with drinking water. No dyspnea or syncope.  Current Outpatient Prescriptions  Medication Sig Dispense Refill  . acetaminophen (TYLENOL) 500 MG tablet Take 500 mg by mouth every 8 (eight) hours as needed for mild pain.     Marland Kitchen aspirin EC 81 MG tablet Take 1 tablet (81 mg total) by mouth daily. 90 tablet 3  . clopidogrel (PLAVIX) 75 MG tablet Take 1 tablet (75 mg total) by mouth daily with breakfast. 30 tablet 11  . lisinopril (PRINIVIL,ZESTRIL) 20 MG tablet Take 1 tablet (20 mg total) by mouth daily. 90 tablet 3  . LORazepam (ATIVAN) 1 MG tablet Take 1 mg by mouth 3 (three) times daily as needed for anxiety.     . Multiple Vitamin (MULTIVITAMIN) tablet Take 1 tablet by mouth daily.    . nitroGLYCERIN (NITROSTAT) 0.4 MG SL tablet Place 1 tablet (0.4 mg total) under  the tongue every 5 (five) minutes x 3 doses as needed for chest pain. 25 tablet 12  . pantoprazole (PROTONIX) 40 MG tablet Take 1 tablet (40 mg total) by mouth daily. 30 tablet 11  . pravastatin (PRAVACHOL) 20 MG tablet Take 1 tablet (20 mg total) by mouth daily at 6 PM. 30 tablet 11  . sucralfate (CARAFATE) 1 G tablet Take 1 g by mouth 4 (four) times daily.    . [DISCONTINUED] atorvastatin (LIPITOR) 80 MG tablet Take 1 tablet (80 mg total) by mouth daily at 6 PM. 30 tablet 5   No current facility-administered medications for this visit.     Past Medical History  Diagnosis Date  . Hypertension   . Anxiety   . GERD (gastroesophageal reflux disease)   . Migraines   . Chronic lower back pain   . Depression   . HLD (hyperlipidemia)   . CAD (coronary artery disease)     a. Chest CTA 8/14 with coronary Ca and RCA aneurysmal dilatation    b. NSTEMI s/p LHC severe aneurysmal dilatation of the RCA, LAD and LCx without flow limiting lesions.  . Tobacco abuse     Past Surgical History  Procedure Laterality Date  . Myringotomy with tube placement Bilateral     "4-5 times as a child" (08/09/2012)  . Left heart cath N/A 02/21/2014    Procedure: LEFT HEART CATH;  Surgeon: Kathleene Hazel, MD;  Location: [facility] CATH LAB;  Service: Cardiovascular;  Laterality: N/A;    History   Social History  .  Marital Status: Divorced    Spouse Name: N/A  . Number of Children: N/A  . Years of Education: N/A   Occupational History  . Not on file.   Social History Main Topics  . Smoking status: Heavy Tobacco Smoker -- 0.50 packs/day for 17 years    Types: Cigarettes  . Smokeless tobacco: Never Used     Comment: 08/09/2012 "smoked 3 ppd til 1 month ago"   . Alcohol Use: No     Comment: 08/09/2012 "had a mixed drink once in the past year"  . Drug Use: No  . Sexual Activity: Yes   Other Topics Concern  . Not on file   Social History Narrative    ROS: no fevers or chills, productive cough,  hemoptysis, dysphasia, odynophagia, melena, hematochezia, dysuria, hematuria, rash, seizure activity, orthopnea, PND, pedal edema, claudication. Remaining systems are negative.  Physical Exam: Well-developed obese in no acute distress.  Skin is warm and dry.  HEENT is normal.  Neck is supple.  Chest is clear to auscultation with normal expansion.  Cardiovascular exam is regular rate and rhythm.  Abdominal exam nontender or distended. No masses palpated. Extremities show no edema. neuro grossly intact  Electrocardiogram shows sinus rhythm with no ST changes.

## 2014-06-01 ENCOUNTER — Ambulatory Visit (INDEPENDENT_AMBULATORY_CARE_PROVIDER_SITE_OTHER): Payer: 59 | Admitting: Cardiology

## 2014-06-01 ENCOUNTER — Encounter: Payer: Self-pay | Admitting: Cardiology

## 2014-06-01 ENCOUNTER — Telehealth: Payer: Self-pay | Admitting: Cardiology

## 2014-06-01 VITALS — BP 130/82 | HR 84 | Ht 71.5 in | Wt 267.8 lb

## 2014-06-01 DIAGNOSIS — E785 Hyperlipidemia, unspecified: Secondary | ICD-10-CM

## 2014-06-01 DIAGNOSIS — I2583 Coronary atherosclerosis due to lipid rich plaque: Secondary | ICD-10-CM

## 2014-06-01 DIAGNOSIS — I2510 Atherosclerotic heart disease of native coronary artery without angina pectoris: Secondary | ICD-10-CM

## 2014-06-01 DIAGNOSIS — I10 Essential (primary) hypertension: Secondary | ICD-10-CM | POA: Diagnosis not present

## 2014-06-01 DIAGNOSIS — Z720 Tobacco use: Secondary | ICD-10-CM | POA: Diagnosis not present

## 2014-06-01 DIAGNOSIS — F411 Generalized anxiety disorder: Secondary | ICD-10-CM

## 2014-06-01 NOTE — Patient Instructions (Signed)
Your physician recommends that you schedule a follow-up appointment in: 6 months with Dr. Crenshaw.  

## 2014-06-01 NOTE — Telephone Encounter (Signed)
Received a call from patient he stated he has appointment with Dr.Crenshaw this afternoon.Stated he had recent lab work with PCP.Troponin normal,elevated cpk.[facility] they will fax lab results.

## 2014-06-01 NOTE — Assessment & Plan Note (Signed)
Blood pressure controlled. Continue present medications. 

## 2014-06-01 NOTE — Assessment & Plan Note (Signed)
Management per primary care. 

## 2014-06-01 NOTE — Assessment & Plan Note (Signed)
Continue statin. 

## 2014-06-01 NOTE — Assessment & Plan Note (Signed)
Patient was noted to have no significant obstructive disease at the time of previous catheterization but he had aneurysmal dilatation of his coronaries. He does not know of a history of Kawasaki's. His troponin was mildly elevated. Question related to slow flow or thrombus formation in the coronaries. Continue aspirin, Plavix and statin. Previous sedimentation rate, rheumatoid factor and ANA to screen for vasculitis normal. He continues to have atypical chest pain. Seen recently by primary care and troponin normal. Electrocardiogram shows no ST changes. No further evaluation.

## 2014-06-01 NOTE — Assessment & Plan Note (Signed)
Patient counseled on discontinuing. 

## 2014-06-02 ENCOUNTER — Encounter: Payer: Self-pay | Admitting: Cardiology

## 2014-06-03 ENCOUNTER — Encounter: Payer: Self-pay | Admitting: Cardiology

## 2014-06-04 ENCOUNTER — Telehealth: Payer: Self-pay | Admitting: Cardiology

## 2014-06-04 DIAGNOSIS — E785 Hyperlipidemia, unspecified: Secondary | ICD-10-CM

## 2014-06-04 DIAGNOSIS — Z79899 Other long term (current) drug therapy: Secondary | ICD-10-CM

## 2014-06-04 MED ORDER — PRAVASTATIN SODIUM 40 MG PO TABS: 40.0000 mg | ORAL_TABLET | Freq: Every day | ORAL | Status: DC

## 2014-06-04 NOTE — Telephone Encounter (Signed)
Spoke with patient. He wants to know if Dr. Jens Somrenshaw can adjust his pravastatin dose or give him a different statin. He reports he was told by PCP his cholesterol went up and CK went down. Informed patient that the labs have not come to out office yet for me to give Dr. Jens Somrenshaw for review and patient asked that I try to obtain labs from PCP. [facility] office at [phone] and they will fax labs.

## 2014-06-04 NOTE — Telephone Encounter (Signed)
Pt  Cholesterol is up high,wants to change from Pravastatin.

## 2014-06-04 NOTE — Telephone Encounter (Signed)
Labs reviewed by Dr. Jens Somrenshaw. He advised patient to increase pravastatin to 40mg  daily and have fasting labs (LFT, lipid) in 4 weeks.   Patient notified of instructions and voiced understanding.   Rx(s) sent to pharmacy electronically, labs ordered and lab slips mailed to patient.

## 2014-06-09 ENCOUNTER — Telehealth: Payer: Self-pay | Admitting: Cardiology

## 2014-06-09 NOTE — Telephone Encounter (Signed)
If he did not go to ER, he should discuss this with primary care Olga MillersBrian Crenshaw

## 2014-06-09 NOTE — Telephone Encounter (Signed)
Pt left leg is cramping so bad,should he take medicine or go to the hospital?

## 2014-06-09 NOTE — Telephone Encounter (Signed)
Ok for robaxin and ibuprofen R[facility] Aid[facility] Sebastian Morris

## 2014-06-09 NOTE — Telephone Encounter (Signed)
Pt says that he went to the ER and he was prescribed  Robaxin 500mg  and Ibuprofen 800mg . He wanted to make sure that Dr. Jens Somrenshaw approved of him taking these medications so that these would not counteract with his Plavix. Please call  Thanks

## 2014-06-09 NOTE — Telephone Encounter (Signed)
Left message for patient of dr crenshaw's recommendations. 

## 2014-06-09 NOTE — Telephone Encounter (Signed)
Will forward for dr crenshaw review  

## 2014-06-09 NOTE — Telephone Encounter (Signed)
Received a call from patient.He stated he has been having pain in left leg since yesterday.Stated pain starts in left groin radiates down into entire left leg.Pain seems worse on inside of left leg around left knee.Has not injured leg.No swelling.No redness.Stated he took Advil 200 mg last night before bed and pain woke him up this morning.Stated he continues to have pain rates pain # 5.Advised to go to ER,will send message to Dr.Crenshaw.

## 2014-06-10 ENCOUNTER — Encounter: Payer: Self-pay | Admitting: Cardiology

## 2014-06-14 ENCOUNTER — Telehealth: Payer: Self-pay | Admitting: Cardiology

## 2014-06-14 DIAGNOSIS — E785 Hyperlipidemia, unspecified: Secondary | ICD-10-CM

## 2014-06-14 NOTE — Telephone Encounter (Signed)
Went to Urgent Care r/t spider bite. Cites blackened wound, had onset of high fever, malaise, etc.  Pain hadn't improved on hydrocodone, fever hadn't improved w/ that, extra tylenol. He switched to ibuprofen d/t ineffectiveness of the tylenol. Got rocephin shot on his 2nd visits, was started on a course of amoxicillin.  Pt is on pravastatin, protonix, but had stopped both 2 days ago at last UC visit.  Still on Plavix daily, all other meds daily.  He states he is taking 12x 200mg  ibuprofen daily. I advised reducing dose if possible, outlined OTC dosing recommendations - advised alternating med w/ tylenol or hydrocodone - reminded him tylenol is in hydrocodone.  Pt verbalized understanding.  Patient had a separate question for Dr. Jens Somrenshaw - patient states pravastatin has been suboptimal for his cholesterol reduction - he had been switched from Crestor d/t cost issues, but states he now has health insurance, wants to know if he could go back on Crestor? (I did inform him there is a generic version now available also).

## 2014-06-14 NOTE — Telephone Encounter (Signed)
Pt been running a high fever,he have not been taking his Plavix and cholesterol medicine. He have not taken this medicine because he is taking medicine for his fever. Please call to advise.

## 2014-06-14 NOTE — Telephone Encounter (Signed)
DC pravachol, crestor 20 mg daily; lipids and liver in 4 weeks Olga MillersBrian Crenshaw

## 2014-06-23 MED ORDER — ROSUVASTATIN CALCIUM 40 MG PO TABS: 40.0000 mg | ORAL_TABLET | Freq: Every day | ORAL | Status: DC

## 2014-06-23 NOTE — Telephone Encounter (Signed)
Detailed message left on patients private voicemail of medication change. Lab orders mailed to the patient.

## 2014-06-25 NOTE — Addendum Note (Signed)
Addended byMarella Bile. on: 06/25/2014 01:16 PM   Modules accepted: Orders

## 2014-07-15 ENCOUNTER — Telehealth: Payer: Self-pay | Admitting: Cardiology

## 2014-07-15 NOTE — Telephone Encounter (Signed)
CALL TRANSFERRED FROM [address] OFFICE  PATIENT STATES HE HAS BEEN HAVING PAIN IN HIS CHEST  ESPECILAL LY ON LEFT SIDE THAT RADIAES THROUGH TO HIS BACK. HE ALSO HAS HAD JAW PAIN , OCCASIONAL RIGHT SIDE PAIN. PATIENT STATES THIS START AFTER SWITCHING TO CRESTO . HE STATES HE FELT WEAK AND SLEEPY YESTERDAY . HE STATES HE WENT JOGGING YESTERDAY WITH NO CHEST PAIN.  HE HAS NOT USED any ntg WITH ANY episodes.  patient states in the past when this has occurred  tropinin slightly elevated. PATIENT IS NEAR  AT PRESENT TIME. RN RECOMMEND GO TO NEAREST ER WHICH IS [facility] [address] PATIENT STATES HE IS NEAR [facility] AND WILL GO THERE.

## 2014-07-19 ENCOUNTER — Telehealth: Payer: Self-pay | Admitting: Cardiology

## 2014-07-19 NOTE — Telephone Encounter (Signed)
Closed encounter °

## 2014-07-19 NOTE — Telephone Encounter (Signed)
[facility] on 08/03/14 with Dr Jens Somrenshaw.  Records given to D Chavis (medical records) for Dr Ludwig Clarksrenshaw's schedule on 08/03/14. lp

## 2014-07-23 ENCOUNTER — Telehealth: Payer: Self-pay | Admitting: Cardiology

## 2014-07-23 DIAGNOSIS — E785 Hyperlipidemia, unspecified: Secondary | ICD-10-CM

## 2014-07-23 MED ORDER — ROSUVASTATIN CALCIUM 20 MG PO TABS: 20.0000 mg | ORAL_TABLET | Freq: Every day | ORAL | Status: DC

## 2014-07-23 NOTE — Telephone Encounter (Signed)
Refill sent electronically. Pt informed via phone call - poor connection, could not hear him when I called listed number. If he calls back, pt can be informed refill sent to requested pharmacy [facility] [place]

## 2014-07-23 NOTE — Telephone Encounter (Signed)
Patient would like new RX called in for Crestor 20 mg to [facility] [place]  Phone [phone].  He is currently cutting a 40 mg tab in 3 pieces and does not feel he is getting the proper dosage.

## 2014-08-02 NOTE — Progress Notes (Signed)
HPI: FU CAD. 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 Cardiac CT 8/14 showed aneurysmal dilatation of RCA, 2 serial 70 percent lesions in prox RCA and 50 PDA. CA score 3557. Echo 8/14 showed normal LV function and mild LAE. Renal Dopplers November 2015 showed 1-59% bilateral stenosis. Nuclear study November 2015 showed ejection fraction 51% and normal perfusion. Admitted in February 2016 with chest pain. Troponin mildly increased (1.18). Cardiac catheterization February 2016 showed an ejection fraction of 60-65%. 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 Patient treated with aspirin and Plavix. Laboratories in March 2016 showed a normal sedimentation rate, normal rheumatoid factor and normal antinuclear antibody. Since last seen,   Current Outpatient Prescriptions  Medication Sig Dispense Refill  . acetaminophen (TYLENOL) 500 MG tablet Take 500 mg by mouth every 8 (eight) hours as needed for mild pain.     Marland Kitchen aspirin EC 81 MG tablet Take 1 tablet (81 mg total) by mouth daily. 90 tablet 3  . clopidogrel (PLAVIX) 75 MG tablet Take 1 tablet (75 mg total) by mouth daily with breakfast. 30 tablet 11  . lisinopril (PRINIVIL,ZESTRIL) 20 MG tablet Take 1 tablet (20 mg total) by mouth daily. 90 tablet 3  . LORazepam (ATIVAN) 1 MG tablet Take 1 mg by mouth 3 (three) times daily as needed for anxiety.     . Multiple Vitamin (MULTIVITAMIN) tablet Take 1 tablet by mouth daily.    . nitroGLYCERIN (NITROSTAT) 0.4 MG SL tablet Place 1 tablet (0.4 mg total) under the tongue every 5 (five) minutes x 3 doses as needed for chest pain. 25 tablet 12  . pantoprazole (PROTONIX) 40 MG tablet Take 1 tablet (40 mg total) by mouth daily. 30 tablet 11  . rosuvastatin (CRESTOR) 20 MG tablet Take 1 tablet (20 mg total) by mouth daily. 90 tablet 3  . sucralfate (CARAFATE) 1 G tablet Take 1 g by mouth 4 (four)  times daily.    . [DISCONTINUED] atorvastatin (LIPITOR) 80 MG tablet Take 1 tablet (80 mg total) by mouth daily at 6 PM. 30 tablet 5   No current facility-administered medications for this visit.     Past Medical History  Diagnosis Date  . Hypertension   . Anxiety   . GERD (gastroesophageal reflux disease)   . Migraines   . Chronic lower back pain   . Depression   . HLD (hyperlipidemia)   . CAD (coronary artery disease)     a. Chest CTA 8/14 with coronary Ca and RCA aneurysmal dilatation    b. NSTEMI s/p LHC severe aneurysmal dilatation of the RCA, LAD and LCx without flow limiting lesions.  . Tobacco abuse     Past Surgical History  Procedure Laterality Date  . Myringotomy with tube placement Bilateral     "4-5 times as a child" (08/09/2012)  . Left heart cath N/A 02/21/2014    Procedure: LEFT HEART CATH;  Surgeon: Kathleene Hazel, MD;  Location: [facility] CATH LAB;  Service: Cardiovascular;  Laterality: N/A;    History   Social History  . Marital Status: Divorced    Spouse Name: N/A  . Number of Children: N/A  . Years of Education: N/A   Occupational History  . Not on file.   Social History Main Topics  . Smoking status: Heavy Tobacco Smoker -- 0.50 packs/day for 17 years    Types: Cigarettes  . Smokeless tobacco: Never  Used     Comment: 08/09/2012 "smoked 3 ppd til 1 month ago"   . Alcohol Use: No     Comment: 08/09/2012 "had a mixed drink once in the past year"  . Drug Use: No  . Sexual Activity: Yes   Other Topics Concern  . Not on file   Social History Narrative    ROS: no fevers or chills, productive cough, hemoptysis, dysphasia, odynophagia, melena, hematochezia, dysuria, hematuria, rash, seizure activity, orthopnea, PND, pedal edema, claudication. Remaining systems are negative.  Physical Exam: Well-developed well-nourished in no acute distress.  Skin is warm and dry.  HEENT is normal.  Neck is supple.  Chest is clear to auscultation with normal  expansion.  Cardiovascular exam is regular rate and rhythm.  Abdominal exam nontender or distended. No masses palpated. Extremities show no edema. neuro grossly intact  ECG     This encounter was created in error - please disregard.

## 2014-08-03 ENCOUNTER — Encounter: Payer: 59 | Admitting: Cardiology

## 2014-08-12 ENCOUNTER — Telehealth: Payer: Self-pay | Admitting: Cardiology

## 2014-08-12 NOTE — Telephone Encounter (Signed)
Spoke with pam, orders for lipid and hepatic given.

## 2014-08-13 ENCOUNTER — Telehealth: Payer: Self-pay | Admitting: Cardiology

## 2014-08-13 NOTE — Telephone Encounter (Signed)
Attempted to call patient - no answer, VM box full 

## 2014-08-13 NOTE — Telephone Encounter (Signed)
Pt is calling in wanting to know if Dr. Jens Som received his lab results from his PCP about his cholesterol levels. He says that that there was a drastic change (for the better ) in his levels ans wanted to know if he could start taking 10 mg Crestor . Please call  Thanks

## 2014-08-13 NOTE — Telephone Encounter (Signed)
Attempted to call patient. NA. Mailbox full   Per Stanton Kidney, patient's PCP office called yesterday about which labs needed to be drawn. No records of labs have been received yet.

## 2014-08-13 NOTE — Telephone Encounter (Signed)
Patient notified lab results have no yet been received and he will be notified when MD reviews them. He wants to decrease his crestor dose.

## 2014-08-17 ENCOUNTER — Ambulatory Visit: Payer: 59 | Admitting: Physician Assistant

## 2014-08-19 ENCOUNTER — Telehealth: Payer: Self-pay | Admitting: *Deleted

## 2014-08-19 NOTE — Telephone Encounter (Signed)
Faxed to the number provided

## 2014-08-19 NOTE — Telephone Encounter (Signed)
Ok for [facility], hold plavix 7 days prior to and resume day after [facility]

## 2014-08-19 NOTE — Telephone Encounter (Signed)
Pt is needing clearance for left knee scope w/partial medial meniscectomy. Also need instructions regarding plavix Will forward for dr Jens Som review

## 2014-09-06 ENCOUNTER — Telehealth: Payer: Self-pay | Admitting: Cardiology

## 2014-09-06 NOTE — Telephone Encounter (Signed)
Pt called in wanting to speak with Dr. Jens Som about a nodule that was found on his liver. He would like to know if this was caused by the Crestor he has been taking. Please f/u with the pt  Thanks

## 2014-09-06 NOTE — Telephone Encounter (Signed)
Returned call to patient no answer.LMTC. 

## 2014-09-07 NOTE — Telephone Encounter (Signed)
[place]

## 2014-09-08 ENCOUNTER — Other Ambulatory Visit: Payer: Self-pay | Admitting: Family Medicine

## 2014-09-08 DIAGNOSIS — R160 Hepatomegaly, not elsewhere classified: Secondary | ICD-10-CM

## 2014-09-23 ENCOUNTER — Other Ambulatory Visit: Payer: Self-pay | Admitting: Family Medicine

## 2014-09-23 ENCOUNTER — Ambulatory Visit
Admission: RE | Admit: 2014-09-23 | Discharge: 2014-09-23 | Disposition: A | Discharge status: Home / Self Care | Payer: 59 | Source: Ambulatory Visit | Attending: Family Medicine | Admitting: Family Medicine

## 2014-09-23 ENCOUNTER — Other Ambulatory Visit: Payer: 59

## 2014-09-23 DIAGNOSIS — Z77018 Contact with and (suspected) exposure to other hazardous metals: Secondary | ICD-10-CM

## 2014-09-23 DIAGNOSIS — R160 Hepatomegaly, not elsewhere classified: Secondary | ICD-10-CM

## 2014-09-23 MED ORDER — GADOBENATE DIMEGLUMINE 529 MG/ML IV SOLN
20.0000 mL | Freq: Once | INTRAVENOUS | Status: AC | PRN
  Administered 2014-09-23: 20 mL via INTRAVENOUS

## 2014-11-10 ENCOUNTER — Other Ambulatory Visit: Payer: Self-pay | Admitting: Cardiology

## 2014-11-10 DIAGNOSIS — R160 Hepatomegaly, not elsewhere classified: Secondary | ICD-10-CM | POA: Insufficient documentation

## 2014-11-10 DIAGNOSIS — E1169 Type 2 diabetes mellitus with other specified complication: Secondary | ICD-10-CM | POA: Insufficient documentation

## 2014-11-10 DIAGNOSIS — E7800 Pure hypercholesterolemia, unspecified: Secondary | ICD-10-CM | POA: Insufficient documentation

## 2014-11-29 ENCOUNTER — Telehealth: Payer: Self-pay | Admitting: Cardiology

## 2014-11-29 NOTE — Telephone Encounter (Signed)
Please call,have two benign masses in his liver. He is not sure,it might be coming from his Plaxix,

## 2014-11-29 NOTE — Telephone Encounter (Signed)
Spoke with pt, aware per kristin, pharm md, none of his medications would be a cause or concern.

## 2014-11-29 NOTE — Telephone Encounter (Signed)
Spoke with pt, he has developed two hemangiomas and he wonders if any of his medications could be a concern. He has heard that aspirin could be a cause and he is also taking plavix. He is also concerned about his crestor. Will discuss with pharm md

## 2015-02-09 ENCOUNTER — Telehealth: Payer: Self-pay | Admitting: Cardiology

## 2015-02-09 DIAGNOSIS — E785 Hyperlipidemia, unspecified: Secondary | ICD-10-CM

## 2015-02-09 NOTE — Telephone Encounter (Signed)
Pt called in wanting to know if his Cholesterol needed to be checked prior to coming in for his appt on 3/17. Please f/u with him  Thanks

## 2015-02-11 NOTE — Telephone Encounter (Signed)
Phone disconnected

## 2015-02-23 NOTE — Telephone Encounter (Signed)
Lab orders for lipid and hepatic mailed to the pt

## 2015-03-15 NOTE — Progress Notes (Signed)
HPI: FU CAD. 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 Cardiac CT 8/14 showed aneurysmal dilatation of RCA, 2 serial 70 percent lesions in prox RCA and 50 PDA. CA score 3557. Echo 8/14 showed normal LV function and mild LAE. Renal Dopplers November 2015 showed 1-59% bilateral stenosis. Nuclear study November 2015 showed ejection fraction 51% and normal perfusion. Admitted in February 2016 with chest pain. Troponin mildly increased (1.18). Cardiac catheterization February 2016 showed an ejection fraction of 60-65%. 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 Patient treated with aspirin and Plavix. Laboratories in March 2016 showed a normal sedimentation rate, normal rheumatoid factor and normal antinuclear antibody. Since last seen,   Current Outpatient Prescriptions  Medication Sig Dispense Refill  . acetaminophen (TYLENOL) 500 MG tablet Take 500 mg by mouth every 8 (eight) hours as needed for mild pain.     Marland Kitchen aspirin EC 81 MG tablet Take 1 tablet (81 mg total) by mouth daily. 90 tablet 3  . clopidogrel (PLAVIX) 75 MG tablet Take 1 tablet (75 mg total) by mouth daily with breakfast. 30 tablet 11  . lisinopril (PRINIVIL,ZESTRIL) 20 MG tablet Take 1 tablet (20 mg total) by mouth daily. 90 tablet 3  . LORazepam (ATIVAN) 1 MG tablet Take 1 mg by mouth 3 (three) times daily as needed for anxiety.     . Multiple Vitamin (MULTIVITAMIN) tablet Take 1 tablet by mouth daily.    . nitroGLYCERIN (NITROSTAT) 0.4 MG SL tablet Place 1 tablet (0.4 mg total) under the tongue every 5 (five) minutes x 3 doses as needed for chest pain. 25 tablet 12  . pantoprazole (PROTONIX) 40 MG tablet Take 1 tablet (40 mg total) by mouth daily. 30 tablet 11  . rosuvastatin (CRESTOR) 20 MG tablet Take 1 tablet (20 mg total) by mouth daily. 90 tablet 3  . sucralfate (CARAFATE) 1 G tablet Take 1 g by mouth 4 (four)  times daily.    . [DISCONTINUED] atorvastatin (LIPITOR) 80 MG tablet Take 1 tablet (80 mg total) by mouth daily at 6 PM. 30 tablet 5   No current facility-administered medications for this visit.     Past Medical History  Diagnosis Date  . Hypertension   . Anxiety   . GERD (gastroesophageal reflux disease)   . Migraines   . Chronic lower back pain   . Depression   . HLD (hyperlipidemia)   . CAD (coronary artery disease)     a. Chest CTA 8/14 with coronary Ca and RCA aneurysmal dilatation    b. NSTEMI s/p LHC severe aneurysmal dilatation of the RCA, LAD and LCx without flow limiting lesions.  . Tobacco abuse     Past Surgical History  Procedure Laterality Date  . Myringotomy with tube placement Bilateral     "4-5 times as a child" (08/09/2012)  . Left heart cath N/A 02/21/2014    Procedure: LEFT HEART CATH;  Surgeon: Kathleene Hazel, MD;  Location: [facility] CATH LAB;  Service: Cardiovascular;  Laterality: N/A;    Social History   Social History  . Marital Status: Divorced    Spouse Name: N/A  . Number of Children: N/A  . Years of Education: N/A   Occupational History  . Not on file.   Social History Main Topics  . Smoking status: Heavy Tobacco Smoker -- 0.50 packs/day for 17 years    Types: Cigarettes  . Smokeless tobacco:  Never Used     Comment: 08/09/2012 "smoked 3 ppd til 1 month ago"   . Alcohol Use: No     Comment: 08/09/2012 "had a mixed drink once in the past year"  . Drug Use: No  . Sexual Activity: Yes   Other Topics Concern  . Not on file   Social History Narrative    Family History  Problem Relation Age of Onset  . Heart attack Father 5957  . Heart attack Paternal Grandfather 5457  . Heart attack Paternal Uncle 57    ROS: no fevers or chills, productive cough, hemoptysis, dysphasia, odynophagia, melena, hematochezia, dysuria, hematuria, rash, seizure activity, orthopnea, PND, pedal edema, claudication. Remaining systems are negative.  Physical  Exam: Well-developed well-nourished in no acute distress.  Skin is warm and dry.  HEENT is normal.  Neck is supple.  Chest is clear to auscultation with normal expansion.  Cardiovascular exam is regular rate and rhythm.  Abdominal exam nontender or distended. No masses palpated. Extremities show no edema. neuro grossly intact  ECG     This encounter was created in error - please disregard.

## 2015-03-25 ENCOUNTER — Encounter: Payer: Self-pay | Admitting: Cardiology

## 2015-04-05 NOTE — Progress Notes (Signed)
HPI: FU CAD. 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 Cardiac CT 8/14 showed aneurysmal dilatation of RCA, 2 serial 70 percent lesions in prox RCA and 50 PDA. CA score 3557. Echo 8/14 showed normal LV function and mild LAE. Renal Dopplers November 2015 showed 1-59% bilateral stenosis. Nuclear study November 2015 showed ejection fraction 51% and normal perfusion. Admitted in February 2016 with chest pain. Troponin mildly increased (1.18). Cardiac catheterization February 2016 showed an ejection fraction of 60-65%. 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 Patient treated with aspirin and Plavix. Laboratories in March 2016 showed a normal sedimentation rate, normal rheumatoid factor and normal antinuclear antibody. Since last seen, He has occasional pain in his chest which is unchanged. It is in the left breast area and described as a sharp pain. Lasts seconds. No radiation or associated symptoms. Not exertional. No dyspnea.  Current Outpatient Prescriptions  Medication Sig Dispense Refill  . acetaminophen (TYLENOL) 500 MG tablet Take 500 mg by mouth every 8 (eight) hours as needed for mild pain.     Marland Kitchen. clopidogrel (PLAVIX) 75 MG tablet Take 1 tablet (75 mg total) by mouth daily with breakfast. 30 tablet 11  . Coenzyme Q10 (COQ10) 100 MG CAPS Take 100 mg by mouth daily.    Marland Kitchen. lisinopril (PRINIVIL,ZESTRIL) 20 MG tablet Take 1 tablet (20 mg total) by mouth daily. 90 tablet 3  . LORazepam (ATIVAN) 1 MG tablet Take 1 mg by mouth 3 (three) times daily as needed for anxiety.     . Multiple Vitamin (MULTIVITAMIN) tablet Take 1 tablet by mouth daily.    . nitroGLYCERIN (NITROSTAT) 0.4 MG SL tablet Place 1 tablet (0.4 mg total) under the tongue every 5 (five) minutes x 3 doses as needed for chest pain. 25 tablet 12  . omeprazole (PRILOSEC) 20 MG capsule Take 20 mg by mouth daily.    .  rosuvastatin (CRESTOR) 20 MG tablet Take 1 tablet (20 mg total) by mouth daily. (Patient taking differently: Take 10 mg by mouth daily. ) 90 tablet 3  . sucralfate (CARAFATE) 1 G tablet Take 1 g by mouth 4 (four) times daily.    . [DISCONTINUED] atorvastatin (LIPITOR) 80 MG tablet Take 1 tablet (80 mg total) by mouth daily at 6 PM. 30 tablet 5   No current facility-administered medications for this visit.     Past Medical History  Diagnosis Date  . Hypertension   . Anxiety   . GERD (gastroesophageal reflux disease)   . Migraines   . Chronic lower back pain   . Depression   . HLD (hyperlipidemia)   . CAD (coronary artery disease)     a. Chest CTA 8/14 with coronary Ca and RCA aneurysmal dilatation    b. NSTEMI s/p LHC severe aneurysmal dilatation of the RCA, LAD and LCx without flow limiting lesions.  . Tobacco abuse     Past Surgical History  Procedure Laterality Date  . Myringotomy with tube placement Bilateral     "4-5 times as a child" (08/09/2012)  . Left heart cath N/A 02/21/2014    Procedure: LEFT HEART CATH;  Surgeon: Kathleene Hazelhristopher D McAlhany, MD;  [facility];  Service: Cardiovascular;  Laterality: N/A;    Social History   Social History  . Marital Status: Divorced    Spouse Name: N/A  . Number of Children: N/A  . Years of Education: N/A  Occupational History  . Not on file.   Social History Main Topics  . Smoking status: Heavy Tobacco Smoker -- 1.50 packs/day for 17 years    Types: Cigarettes  . Smokeless tobacco: Never Used     Comment: 08/09/2012 "smoked 3 ppd til 1 month ago"   . Alcohol Use: No     Comment: 08/09/2012 "had a mixed drink once in the past year"  . Drug Use: No  . Sexual Activity: Yes   Other Topics Concern  . Not on file   Social History Narrative    Family History  Problem Relation Age of Onset  . Heart attack Father 54  . Heart attack Paternal Grandfather 15  . Heart attack Paternal Uncle 57    ROS: no fevers or chills,  productive cough, hemoptysis, dysphasia, odynophagia, melena, hematochezia, dysuria, hematuria, rash, seizure activity, orthopnea, PND, pedal edema, claudication. Remaining systems are negative.  Physical Exam: Well-developed obese in no acute distress.  Skin is warm and dry.  HEENT is normal.  Neck is supple.  Chest is clear to auscultation with normal expansion.  Cardiovascular exam is regular rate and rhythm.  Abdominal exam nontender or distended. No masses palpated. Extremities show no edema. neuro grossly intact  ECG Normal sinus rhythm with no ST changes.

## 2015-04-08 ENCOUNTER — Encounter: Payer: Self-pay | Admitting: Cardiology

## 2015-04-08 ENCOUNTER — Ambulatory Visit (INDEPENDENT_AMBULATORY_CARE_PROVIDER_SITE_OTHER): Payer: BLUE CROSS/BLUE SHIELD | Admitting: Cardiology

## 2015-04-08 ENCOUNTER — Telehealth: Payer: Self-pay | Admitting: Cardiology

## 2015-04-08 VITALS — BP 139/98 | HR 79 | Ht 71.5 in | Wt 276.6 lb

## 2015-04-08 DIAGNOSIS — I10 Essential (primary) hypertension: Secondary | ICD-10-CM

## 2015-04-08 DIAGNOSIS — F411 Generalized anxiety disorder: Secondary | ICD-10-CM | POA: Diagnosis not present

## 2015-04-08 DIAGNOSIS — E785 Hyperlipidemia, unspecified: Secondary | ICD-10-CM | POA: Diagnosis not present

## 2015-04-08 DIAGNOSIS — I2510 Atherosclerotic heart disease of native coronary artery without angina pectoris: Secondary | ICD-10-CM | POA: Diagnosis not present

## 2015-04-08 DIAGNOSIS — Z720 Tobacco use: Secondary | ICD-10-CM

## 2015-04-08 DIAGNOSIS — I2583 Coronary atherosclerosis due to lipid rich plaque: Principal | ICD-10-CM

## 2015-04-08 LAB — HEPATIC FUNCTION PANEL
ALBUMIN: 4.4 g/dL (ref 3.6–5.1)
ALK PHOS: 48 U/L (ref 40–115)
ALT: 32 U/L (ref 9–46)
AST: 22 U/L (ref 10–40)
BILIRUBIN TOTAL: 0.5 mg/dL (ref 0.2–1.2)
Bilirubin, Direct: 0.1 mg/dL (ref <=0.2)
Indirect Bilirubin: 0.4 mg/dL (ref 0.2–1.2)
Total Protein: 6.9 g/dL (ref 6.1–8.1)

## 2015-04-08 LAB — LIPID PANEL
CHOL/HDL RATIO: 7.3 ratio — AB (ref <=5.0)
CHOLESTEROL: 198 mg/dL (ref 125–200)
HDL: 27 mg/dL — AB (ref >=40)
LDL Cholesterol: 144 mg/dL — ABNORMAL HIGH (ref <130)
TRIGLYCERIDES: 134 mg/dL (ref <150)
VLDL: 27 mg/dL (ref <30)

## 2015-04-08 LAB — BASIC METABOLIC PANEL
BUN: 10 mg/dL (ref 7–25)
CALCIUM: 9.3 mg/dL (ref 8.6–10.3)
CO2: 27 mmol/L (ref 20–31)
CREATININE: 1 mg/dL (ref 0.60–1.35)
Chloride: 103 mmol/L (ref 98–110)
Glucose, Bld: 97 mg/dL (ref 65–99)
Potassium: 5.1 mmol/L (ref 3.5–5.3)
SODIUM: 138 mmol/L (ref 135–146)

## 2015-04-08 MED ORDER — CRESTOR 10 MG PO TABS: 10.0000 mg | ORAL_TABLET | Freq: Every day | ORAL | Status: DC

## 2015-04-08 MED ORDER — CLOPIDOGREL BISULFATE 75 MG PO TABS: 75.0000 mg | ORAL_TABLET | Freq: Every day | ORAL | Status: DC

## 2015-04-08 NOTE — Assessment & Plan Note (Addendum)
Blood pressure mildly elevated but he has not taken his lisinopril this morning. He will follow this and we will adjust regimen as needed. Continue present medications. Check potassium and renal function.

## 2015-04-08 NOTE — Assessment & Plan Note (Addendum)
-   Continue Plavix and statin 

## 2015-04-08 NOTE — Assessment & Plan Note (Signed)
Management per primary care. 

## 2015-04-08 NOTE — Telephone Encounter (Signed)
covermy meds prior approval for brand crestor 20 mg started

## 2015-04-08 NOTE — Telephone Encounter (Signed)
Pt is calling in stating that a prior approval for his Crestor to be refilled. He says that the generic does not work for him and medical necessity form will need to stated the reason why he needs the Brand name opposed to the generic. He asked that in the mean time if Pravast[facility] could [facility] . Please f/u with him  Thanks

## 2015-04-08 NOTE — Patient Instructions (Signed)
Medication Instructions:  Your physician recommends that you continue on your current medications as directed. Please refer to the Current Medication list given to you today.   Labwork: Your physician recommends that you return for lab work FASTING- WHEN CONVENIENT AS SOON AS POSSIBLE.  The lab can be found on the FIRST FLOOR of out building in [address]   Follow-Up: Your physician recommends that you schedule a follow-up appointment in: 12 MONTHS WITH DR CRENSHAW.   Any Other Special Instructions Will Be Listed Below (If Applicable).     If you need a refill on your cardiac medications before your next appointment, please call your pharmacy.

## 2015-04-08 NOTE — Assessment & Plan Note (Signed)
Continue statin. Check lipids and liver. 

## 2015-04-08 NOTE — Assessment & Plan Note (Signed)
Patient counseled on discontinuing. 

## 2015-04-11 ENCOUNTER — Telehealth: Payer: Self-pay | Admitting: Cardiology

## 2015-04-11 ENCOUNTER — Other Ambulatory Visit: Payer: Self-pay | Admitting: *Deleted

## 2015-04-11 DIAGNOSIS — E785 Hyperlipidemia, unspecified: Secondary | ICD-10-CM

## 2015-04-11 MED ORDER — CRESTOR 20 MG PO TABS: 20.0000 mg | ORAL_TABLET | Freq: Every day | ORAL | Status: DC

## 2015-04-11 NOTE — Telephone Encounter (Signed)
Returned call to patient. Discussed results & recommendation for Crestor dosing.  Pt explains he has only been taking 10mg  daily for about 3 months. Advised in this case to bump to 20mg  daily, f/u on lipid & liver test in 4 weeks.  Pt notes he is still waiting on an authorization from us to insurance for name-brand Crestor - states Stanton KidneyDebra was working on this.

## 2015-04-11 NOTE — Telephone Encounter (Signed)
New message  ° ° °Patient calling back to speak with nurse  °

## 2015-04-20 ENCOUNTER — Telehealth: Payer: Self-pay | Admitting: Cardiology

## 2015-04-20 NOTE — Telephone Encounter (Signed)
New message     Pt c/o medication issue:  1. Name of Medication: crestor  2. How are you currently taking this medication (dosage and times per day)? 20mg  3. Are you having a reaction (difficulty breathing--STAT)? no  4. What is your medication issue? Pt has been out of rx for almost 2 weeks.  The ins company states they have not received proper paperwork for pt to have the brand name and not the generic.  Pt is calling to see if he can be switched to something else or what would the doctor like to do since he cannot afford the medication.  Please call

## 2015-04-20 NOTE — Telephone Encounter (Signed)
Dc crestor, pravachol 80 mg daily, lipids and liver 4 weeks Olga MillersBrian Crenshaw

## 2015-04-21 MED ORDER — PRAVASTATIN SODIUM 80 MG PO TABS: 80.0000 mg | ORAL_TABLET | Freq: Every evening | ORAL | Status: DC

## 2015-04-21 NOTE — Telephone Encounter (Signed)
Patient returned call.  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   Patient is agreeable to med change and repeat lab work

## 2015-04-21 NOTE — Telephone Encounter (Signed)
Dc crestor and begin pravachol as previously outlined Jason Morris

## 2015-04-21 NOTE — Telephone Encounter (Signed)
LMTCB

## 2015-04-21 NOTE — Telephone Encounter (Signed)
Patient notified of MD advice to start pravastatin 80mg  daily. Rx(s) sent to pharmacy electronically. Per previous labs on 3/31 - Debra, RN had already ordered lipid/liver test and mailed lab slips to patient.

## 2015-12-12 ENCOUNTER — Telehealth: Payer: Self-pay | Admitting: Cardiology

## 2015-12-12 MED ORDER — LISINOPRIL 40 MG PO TABS: 20.0000 mg | ORAL_TABLET | Freq: Two times a day (BID) | ORAL | 6 refills | Status: DC

## 2015-12-12 NOTE — Telephone Encounter (Signed)
Pt states he is returning a call to Ardith DarkDebra Mathis-he is requesting a refill of Crestor and Lisinopril and said Stanton KidneyDebra was calling regarding the pharmacy calling about the refill

## 2015-12-12 NOTE — Telephone Encounter (Signed)
Spoke with pt, he is currently taking the lisinopril 40 mg 1/2 tablet twice daily. New script sent to the pharmacy. He also reports trouble with erectile dysfunction. Referred patient to his medical doctor to discuss.

## 2016-01-10 ENCOUNTER — Telehealth: Payer: Self-pay | Admitting: Cardiology

## 2016-01-10 DIAGNOSIS — Z79899 Other long term (current) drug therapy: Secondary | ICD-10-CM

## 2016-01-10 DIAGNOSIS — E785 Hyperlipidemia, unspecified: Secondary | ICD-10-CM

## 2016-01-10 NOTE — Telephone Encounter (Signed)
New message   Patient calling supposed to have labs prior to appt on 1/4. No current order in system.

## 2016-01-10 NOTE — Telephone Encounter (Signed)
[facility] with [facility] personnel. Patient is at the lab and needs orders. Patient was supposed to have lipid/liver ordered April 2017. Reordered these labs + BMET

## 2016-01-11 LAB — LIPID PANEL
CHOL/HDL RATIO: 6.4 ratio — AB (ref <5.0)
Cholesterol: 174 mg/dL (ref <200)
HDL: 27 mg/dL — ABNORMAL LOW (ref >40)
LDL Cholesterol: 114 mg/dL — ABNORMAL HIGH (ref <100)
Triglycerides: 164 mg/dL — ABNORMAL HIGH (ref <150)
VLDL: 33 mg/dL — ABNORMAL HIGH (ref <30)

## 2016-01-11 LAB — BASIC METABOLIC PANEL
BUN: 9 mg/dL (ref 7–25)
CO2: 24 mmol/L (ref 20–31)
Calcium: 9.4 mg/dL (ref 8.6–10.3)
Chloride: 105 mmol/L (ref 98–110)
Creat: 0.92 mg/dL (ref 0.60–1.35)
Glucose, Bld: 106 mg/dL — ABNORMAL HIGH (ref 65–99)
POTASSIUM: 4.8 mmol/L (ref 3.5–5.3)
SODIUM: 137 mmol/L (ref 135–146)

## 2016-01-11 LAB — HEPATIC FUNCTION PANEL
ALBUMIN: 4.3 g/dL (ref 3.6–5.1)
ALK PHOS: 49 U/L (ref 40–115)
ALT: 31 U/L (ref 9–46)
AST: 22 U/L (ref 10–40)
BILIRUBIN DIRECT: 0.1 mg/dL (ref <=0.2)
BILIRUBIN TOTAL: 0.4 mg/dL (ref 0.2–1.2)
Indirect Bilirubin: 0.3 mg/dL (ref 0.2–1.2)
Total Protein: 6.9 g/dL (ref 6.1–8.1)

## 2016-01-12 ENCOUNTER — Ambulatory Visit: Payer: Self-pay | Admitting: Cardiology

## 2016-01-18 ENCOUNTER — Encounter: Payer: Self-pay | Admitting: Cardiology

## 2016-01-18 ENCOUNTER — Ambulatory Visit (INDEPENDENT_AMBULATORY_CARE_PROVIDER_SITE_OTHER): Payer: BLUE CROSS/BLUE SHIELD | Admitting: Cardiology

## 2016-01-18 VITALS — BP 130/82 | HR 77 | Ht 71.5 in | Wt 283.2 lb

## 2016-01-18 DIAGNOSIS — Z6838 Body mass index (BMI) 38.0-38.9, adult: Secondary | ICD-10-CM | POA: Insufficient documentation

## 2016-01-18 DIAGNOSIS — E785 Hyperlipidemia, unspecified: Secondary | ICD-10-CM

## 2016-01-18 DIAGNOSIS — I2510 Atherosclerotic heart disease of native coronary artery without angina pectoris: Secondary | ICD-10-CM | POA: Diagnosis not present

## 2016-01-18 DIAGNOSIS — Z720 Tobacco use: Secondary | ICD-10-CM | POA: Diagnosis not present

## 2016-01-18 DIAGNOSIS — I10 Essential (primary) hypertension: Secondary | ICD-10-CM

## 2016-01-18 DIAGNOSIS — E669 Obesity, unspecified: Secondary | ICD-10-CM | POA: Insufficient documentation

## 2016-01-18 MED ORDER — TADALAFIL 20 MG PO TABS: 20.0000 mg | ORAL_TABLET | Freq: Every day | ORAL | 0 refills | Status: DC | PRN

## 2016-01-18 NOTE — Assessment & Plan Note (Signed)
BMI 38- he has gained 20 lbs over the past several months

## 2016-01-18 NOTE — Assessment & Plan Note (Addendum)
1.0-1.5 ppd

## 2016-01-18 NOTE — Progress Notes (Signed)
01/18/2016 Jason Morris   09-29-1975  [id number]  Primary Physician Delorse LekBURNETT,BRENT A, MD Primary Cardiologist: Dr Jens Somrenshaw  HPI:  41 y/o with a history of chest pain in Aug 2014. 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 Cardiac CT 8/14 showed aneurysmal dilatation of RCA, 2 serial 70 percent lesions in prox RCA and 50 PDA. CA score 3557. Echo 8/14 showed normal LV function and mild LAE. Renal Dopplers November 2015 showed 1-59% bilateral stenosis. Nuclear study November 2015 showed ejection fraction 51% and normal perfusion. He was admitted again in February 2016 with chest pain. Troponin mildly increased (1.18). Cardiac catheterization February 2016 showed an ejection fraction of 60-65%. 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 Patient treated with aspirin and Plavix. Laboratories in March 2016 showed a normal sedimentation rate, normal rheumatoid factor and normal antinuclear antibody.   He is in the office today for follow up. He denies chest pain. He is complaining of side effects from Crestor- decreased libido and ED. LDL last week was 114, on Crestor 20 mg. He denies any anginal pain. He continues to smoke 1-1.5 ppd. He has gained 20 lbs in the last several months. He admits he gave up soda but drinks "swet tea".    Current Outpatient Prescriptions  Medication Sig Dispense Refill  . clopidogrel (PLAVIX) 75 MG tablet Take 1 tablet (75 mg total) by mouth daily with breakfast. 90 tablet 3  . Coenzyme Q10 (COQ10) 100 MG CAPS Take 1 tablet by mouth daily.    Marland Kitchen. lisinopril (PRINIVIL,ZESTRIL) 40 MG tablet Take 0.5 tablets (20 mg total) by mouth 2 (two) times daily. 30 tablet 6  . Multiple Vitamin (MULTIVITAMIN) tablet Take 1 tablet by mouth daily.    . rosuvastatin (CRESTOR) 40 MG tablet Take 20 mg by mouth daily.     No current facility-administered medications for this visit.      Allergies  Allergen Reactions  . Lipitor [Atorvastatin] Hives  . Lopressor [Metoprolol Tartrate] Shortness Of Breath    Felt like he was going to die , could not breath & syncope  . Eggs Or Egg-Derived Products Other (See Comments)    Really bad stomach pain  . Lactose Intolerance (Gi) Diarrhea    Social History   Social History  . Marital status: Divorced    Spouse name: N/A  . Number of children: N/A  . Years of education: N/A   Occupational History  . Not on file.   Social History Main Topics  . Smoking status: Heavy Tobacco Smoker    Packs/day: 1.50    Years: 17.00    Types: Cigarettes  . Smokeless tobacco: Never Used     Comment: 08/09/2012 "smoked 3 ppd til 1 month ago"   . Alcohol use No     Comment: 08/09/2012 "had a mixed drink once in the past year"  . Drug use: No  . Sexual activity: Yes   Other Topics Concern  . Not on file   Social History Narrative  . No narrative on file     Review of Systems: General: negative for chills, fever, night sweats or weight changes.  Cardiovascular: negative for chest pain, dyspnea on exertion, edema, orthopnea, palpitations, paroxysmal nocturnal dyspnea or shortness of breath Dermatological: negative for rash Respiratory: negative for cough or wheezing Urologic: negative for hematuria Abdominal: negative for nausea, vomiting, diarrhea, bright red blood per rectum, melena, or hematemesis Neurologic: negative  for visual changes, syncope, or dizziness All other systems reviewed and are otherwise negative except as noted above.    Blood pressure 130/82, pulse 77, height 5' 11.5" (1.816 m), weight 283 lb 3.2 oz (128.5 kg).  General appearance: alert, cooperative, no distress and moderately obese Neck: no carotid bruit and no JVD Lungs: clear to auscultation bilaterally Heart: regular rate and rhythm Skin: Skin color, texture, turgor normal. No rashes or lesions Neurologic: Grossly normal  EKG NSR  ASSESSMENT AND  PLAN:   CAD (coronary artery disease) a. Chest CTA 8/14 with coronary Ca and RCA aneurysmal dilatation    b. NSTEMI Aug 2014 s/p LHC severe aneurysmal dilatation of the RCA, LAD and LCx without flow limiting lesions.  Essential hypertension Controlled  Dyslipidemia Pt was allergic to Lipitor- rash, Pravachol ineffective, now he thinks Crestor is causing side effects (ED, decreased libido). LDL 118- Jan 2018 on Crestor 20 mg  Tobacco abuse 1.0-1.5 ppd  Obesity (BMI 30-39.9) BMI 38- he has gained 20 lbs over the past several months   PLAN  I suggested the pt stop his Crestor to see if his symptoms improve. He may be a candidate for our PCSK9 trial, I'll refer him to our pharmacist. . I strongly encouraged him to stop smoking. We discussed atherogenesis and cardiac disease risk factors. I did provide him with an RX for Cilias 20 mg-#10 with no refills. He now has a PCP and I suggested he follow up with them for this issue as well as wgt loss and smoking cessation.   Corine Shelter PA-C 01/18/2016 4:35 PM

## 2016-01-18 NOTE — Assessment & Plan Note (Signed)
Controlled.  

## 2016-01-18 NOTE — Assessment & Plan Note (Addendum)
Pt was allergic to Lipitor- rash, Pravachol ineffective, now he thinks Crestor is causing side effects (ED, decreased libido). LDL 118- Jan 2018 on Crestor 20 mg

## 2016-01-18 NOTE — Assessment & Plan Note (Signed)
a. Chest CTA 8/14 with coronary Ca and RCA aneurysmal dilatation    b. NSTEMI Aug 2014 s/p LHC severe aneurysmal dilatation of the RCA, LAD and LCx without flow limiting lesions.

## 2016-01-18 NOTE — Patient Instructions (Signed)
Medication sent to pharmacy-- no refill  No other changes    Your physician wants you to follow-up in 6 month with DR Jens Somrenshaw. You will receive a reminder letter in the mail two months in advance. If you don't receive a letter, please call our office to schedule the follow-up appointment.    If you need a refill on your cardiac medications before your next appointment, please call your pharmacy.    .Marland Kitchen

## 2016-01-27 ENCOUNTER — Ambulatory Visit: Payer: Self-pay | Admitting: Family

## 2016-01-27 ENCOUNTER — Telehealth: Payer: Self-pay | Admitting: Cardiology

## 2016-01-27 ENCOUNTER — Ambulatory Visit (INDEPENDENT_AMBULATORY_CARE_PROVIDER_SITE_OTHER): Payer: BLUE CROSS/BLUE SHIELD | Admitting: Family

## 2016-01-27 ENCOUNTER — Encounter: Payer: Self-pay | Admitting: Family

## 2016-01-27 VITALS — BP 140/91 | HR 76 | Temp 98.0°F | Ht 71.5 in | Wt 285.6 lb

## 2016-01-27 DIAGNOSIS — I2510 Atherosclerotic heart disease of native coronary artery without angina pectoris: Secondary | ICD-10-CM

## 2016-01-27 DIAGNOSIS — I252 Old myocardial infarction: Secondary | ICD-10-CM

## 2016-01-27 DIAGNOSIS — I10 Essential (primary) hypertension: Secondary | ICD-10-CM | POA: Diagnosis not present

## 2016-01-27 DIAGNOSIS — K219 Gastro-esophageal reflux disease without esophagitis: Secondary | ICD-10-CM

## 2016-01-27 DIAGNOSIS — N529 Male erectile dysfunction, unspecified: Secondary | ICD-10-CM | POA: Diagnosis not present

## 2016-01-27 DIAGNOSIS — Z720 Tobacco use: Secondary | ICD-10-CM | POA: Diagnosis not present

## 2016-01-27 DIAGNOSIS — F411 Generalized anxiety disorder: Secondary | ICD-10-CM

## 2016-01-27 DIAGNOSIS — E785 Hyperlipidemia, unspecified: Secondary | ICD-10-CM | POA: Diagnosis not present

## 2016-01-27 DIAGNOSIS — Z23 Encounter for immunization: Secondary | ICD-10-CM | POA: Diagnosis not present

## 2016-01-27 DIAGNOSIS — E669 Obesity, unspecified: Secondary | ICD-10-CM

## 2016-01-27 MED ORDER — TADALAFIL 20 MG PO TABS: 20.0000 mg | ORAL_TABLET | Freq: Every day | ORAL | 2 refills | Status: DC | PRN

## 2016-01-27 NOTE — Patient Instructions (Signed)

## 2016-01-27 NOTE — Telephone Encounter (Signed)
New Message  Pt voiced he is needing his pre-certification for his medication.  Statin medication and replacement regarding shot.  Please f/u with pt

## 2016-01-27 NOTE — Progress Notes (Signed)
Subjective:    Patient ID: Jason Morris, male    DOB: [birth year], 41 y.o.   MRN: [id number]  PT presents to the office today to establish care. Pt is followed by Cardiologist every annually CAD, HTN, Dyslipidemia, and NSTEMI Aug 2014.  Hypertension  This is a chronic problem. The current episode started more than 1 year ago. The problem has been waxing and waning since onset. The problem is uncontrolled. Associated symptoms include anxiety and chest pain. Pertinent negatives include no blurred vision, headaches, palpitations, peripheral edema or shortness of breath. Risk factors for coronary artery disease include dyslipidemia, obesity, smoking/tobacco exposure, sedentary lifestyle and family history. Past treatments include ACE inhibitors. The current treatment provides mild improvement. Hypertensive end-organ damage includes CAD/MI. There is no history of kidney disease, CVA or heart failure.  Hyperlipidemia  This is a chronic problem. The current episode started more than 1 year ago. The problem is uncontrolled. Recent lipid tests were reviewed and are high. Exacerbating diseases include obesity. Associated symptoms include chest pain. Pertinent negatives include no shortness of breath. Current antihyperlipidemic treatment includes statins. The current treatment provides moderate improvement of lipids. Risk factors for coronary artery disease include dyslipidemia, family history, hypertension, male sex, obesity, a sedentary lifestyle and stress.  Gastroesophageal Reflux  He complains of chest pain, heartburn and a hoarse voice. He reports no dysphagia or no wheezing. This is a chronic problem. The current episode started more than 1 year ago. The problem occurs frequently. The problem has been waxing and waning. The heartburn is of moderate intensity. The symptoms are aggravated by certain foods, lying down, smoking and medications. Risk factors include smoking/tobacco exposure. He has tried a diet  change for the symptoms. The treatment provided mild relief.  Anxiety  Presents for follow-up visit. Symptoms include chest pain, impotence and irritability. Patient reports no excessive worry, nervous/anxious behavior, palpitations or shortness of breath. Symptoms occur rarely. The quality of sleep is good.    ED PT takes Cialis 20 mg as needed. States this helps. Pt states this was caused by his statins. PT states he has been off his Crestor for one week and states this has helped a little.     Review of Systems  Constitutional: Positive for irritability.  HENT: Positive for hoarse voice.   Eyes: Negative for blurred vision.  Respiratory: Negative for shortness of breath and wheezing.   Cardiovascular: Positive for chest pain. Negative for palpitations.  Gastrointestinal: Positive for heartburn. Negative for dysphagia.  Genitourinary: Positive for impotence.  Neurological: Negative for headaches.  Psychiatric/Behavioral: The patient is not nervous/anxious.   All other systems reviewed and are negative.   Family History  Problem Relation Age of Onset  . Heart attack Father 61  . Heart attack Paternal Grandfather 30  . Heart attack Paternal Uncle 8   Social History   Social History  . Marital status: Divorced    Spouse name: N/A  . Number of children: N/A  . Years of education: N/A   Social History Main Topics  . Smoking status: Heavy Tobacco Smoker    Packs/day: 1.50    Years: 17.00    Types: Cigarettes  . Smokeless tobacco: Never Used     Comment: 08/09/2012 "smoked 3 ppd til 1 month ago"   . Alcohol use No     Comment: 08/09/2012 "had a mixed drink once in the past year"  . Drug use: No  . Sexual activity: Yes   Other Topics Concern  .  None   Social History Narrative  . None       Objective:   Physical Exam  Constitutional: He is oriented to person, place, and time. He appears well-developed and well-nourished. No distress.  Obese   HENT:  Head:  Normocephalic.  Right Ear: External ear normal.  Left Ear: External ear normal.  Nose: Nose normal.  Mouth/Throat: Oropharynx is clear and moist.  Eyes: Pupils are equal, round, and reactive to light. Right eye exhibits no discharge. Left eye exhibits no discharge.  Neck: Normal range of motion. Neck supple. No thyromegaly present.  Cardiovascular: Normal rate, regular rhythm, normal heart sounds and intact distal pulses.   No murmur heard. Pulmonary/Chest: Effort normal and breath sounds normal. No respiratory distress. He has no wheezes.  Abdominal: Soft. Bowel sounds are normal. He exhibits no distension. There is no tenderness.  Musculoskeletal: Normal range of motion. He exhibits no edema or tenderness.  Neurological: He is alert and oriented to person, place, and time. He has normal reflexes. No cranial nerve deficit.  Skin: Skin is warm and dry. No rash noted. No erythema.  Psychiatric: He has a normal mood and affect. His behavior is normal. Judgment and thought content normal.  Vitals reviewed.     BP (!) 140/91   Pulse 76   Temp 98 F (36.7 C) (Oral)   Ht 5' 11.5" (1.816 m)   Wt 285 lb 9.6 oz (129.5 kg)   BMI 39.28 kg/m      Assessment & Plan:  1. Essential hypertension - CMP14+EGFR  2. Coronary artery disease involving native coronary artery of native heart without angina pectoris - CMP14+EGFR - Lipid panel  3. Dyslipidemia - CMP14+EGFR - Lipid panel  4. GAD (generalized anxiety disorder) - CMP14+EGFR  5. Obesity (BMI 30-39.9)  - CMP14+EGFR  6. Tobacco abuse - CMP14+EGFR  7. Gastroesophageal reflux disease, esophagitis presence not specified - CMP14+EGFR  8. History of non-ST elevation myocardial infarction (NSTEMI) - CMP14+EGFR  9. Erectile dysfunction, unspecified erectile dysfunction type -PT states the Viagra did not work, but Cialis has worked for him in the past - tadalafil (CIALIS) 20 MG tablet; Take 1 tablet (20 mg total) by mouth daily  as needed for erectile dysfunction.  Dispense: 10 tablet; Refill: 2   Continue all meds Labs pending Health Maintenance reviewed Diet and exercise encouraged RTO 3 months , pt to follow up with Tammy about Dyslipidemia   Evelina Dun, FNP

## 2016-01-27 NOTE — Telephone Encounter (Signed)
Returned call to patient-patient states he needs PA for Cialis prescribed at last OV.  Also states he was wondering about the "injection" mentioned for his cholesterol.  States he had lipid panel drawn today.  Advised we would attempt a PA for Cialis but according to OV note he will need to follow up with PCP for further management and refills.  Pt aware.  Advised once lab results are available MD can review and provide recommendations.

## 2016-01-28 LAB — CMP14+EGFR
A/G RATIO: 1.7 (ref 1.2–2.2)
ALBUMIN: 4.3 g/dL (ref 3.5–5.5)
ALT: 33 IU/L (ref 0–44)
AST: 23 IU/L (ref 0–40)
Alkaline Phosphatase: 49 IU/L (ref 39–117)
BILIRUBIN TOTAL: 0.4 mg/dL (ref 0.0–1.2)
BUN / CREAT RATIO: 10 (ref 9–20)
BUN: 9 mg/dL (ref 6–20)
CHLORIDE: 97 mmol/L (ref 96–106)
CO2: 24 mmol/L (ref 18–29)
Calcium: 9.6 mg/dL (ref 8.7–10.2)
Creatinine, Ser: 0.88 mg/dL (ref 0.76–1.27)
GFR calc non Af Amer: 109 mL/min/{1.73_m2} (ref >59)
GFR, EST AFRICAN AMERICAN: 126 mL/min/{1.73_m2} (ref >59)
Globulin, Total: 2.6 g/dL (ref 1.5–4.5)
Glucose: 98 mg/dL (ref 65–99)
POTASSIUM: 4.6 mmol/L (ref 3.5–5.2)
Sodium: 137 mmol/L (ref 134–144)
TOTAL PROTEIN: 6.9 g/dL (ref 6.0–8.5)

## 2016-01-28 LAB — LIPID PANEL
CHOL/HDL RATIO: 7.4 ratio — AB (ref 0.0–5.0)
Cholesterol, Total: 229 mg/dL — ABNORMAL HIGH (ref 100–199)
HDL: 31 mg/dL — ABNORMAL LOW (ref >39)
LDL Calculated: 159 mg/dL — ABNORMAL HIGH (ref 0–99)
Triglycerides: 196 mg/dL — ABNORMAL HIGH (ref 0–149)
VLDL CHOLESTEROL CAL: 39 mg/dL (ref 5–40)

## 2016-01-30 NOTE — Telephone Encounter (Addendum)
PA completed and sent to plan via covermymeds.  Fax received-approved 1/22.

## 2016-01-31 ENCOUNTER — Telehealth: Payer: Self-pay | Admitting: Cardiology

## 2016-01-31 NOTE — Telephone Encounter (Signed)
Pt called to follow up on being placed on the New Cholesterol program.   Please give him a call back.

## 2016-01-31 NOTE — Telephone Encounter (Signed)
Spoke with patient and he stated at the last [employer] Luke K PA mentioned and injectable medication to help with his cholesterol, he has not heard anythign Will forward to Pharm D for review

## 2016-02-02 ENCOUNTER — Ambulatory Visit: Payer: BLUE CROSS/BLUE SHIELD | Admitting: Pharmacist

## 2016-02-02 NOTE — Telephone Encounter (Signed)
This requires review by pharmacy.

## 2016-02-02 NOTE — Telephone Encounter (Signed)
Appointment set to come Thursday Feb 1 for lipid clinic

## 2016-02-02 NOTE — Telephone Encounter (Signed)
F/u message ° °Pt returning RN call. Please call back to discuss  °

## 2016-02-07 ENCOUNTER — Ambulatory Visit (INDEPENDENT_AMBULATORY_CARE_PROVIDER_SITE_OTHER): Payer: BLUE CROSS/BLUE SHIELD | Admitting: Family Medicine

## 2016-02-07 ENCOUNTER — Encounter: Payer: Self-pay | Admitting: Family Medicine

## 2016-02-07 VITALS — BP 125/84 | HR 74 | Temp 97.1°F | Ht 71.5 in | Wt 276.2 lb

## 2016-02-07 DIAGNOSIS — N529 Male erectile dysfunction, unspecified: Secondary | ICD-10-CM | POA: Diagnosis not present

## 2016-02-07 DIAGNOSIS — I2510 Atherosclerotic heart disease of native coronary artery without angina pectoris: Secondary | ICD-10-CM | POA: Diagnosis not present

## 2016-02-07 DIAGNOSIS — E785 Hyperlipidemia, unspecified: Secondary | ICD-10-CM

## 2016-02-07 MED ORDER — PITAVASTATIN CALCIUM 4 MG PO TABS: 4.0000 mg | ORAL_TABLET | Freq: Every day | ORAL | 5 refills | Status: DC

## 2016-02-07 MED ORDER — SILDENAFIL CITRATE 20 MG PO TABS: ORAL_TABLET | ORAL | 3 refills | Status: DC

## 2016-02-07 NOTE — Progress Notes (Signed)
   HPI  Patient presents today to discuss hyperlipidemia and erectile dysfunction.  Patient has a history of NSTEMI with cath that showed aneurysmal dilation of the LCA RCA and left circumflex arteries. He was started on statins and Plavix. He had hives reaction to Lipitor. He had myalgias with pravastatin and more severe myalgias on Crestor. He states he stopped Crestor about 2 weeks ago due to erectile dysfunction which he feels is related.  He complains of difficulty getting and maintaining an erection. Cialis is helpful but not affordable.  He is interested in PCSK-9 inhibitors.   He reports normal libido. He reports decreased energy.  PMH: Smoking status noted ROS: Per HPI  Objective: BP 125/84   Pulse 74   Temp 97.1 F (36.2 C) (Oral)   Ht 5' 11.5" (1.816 m)   Wt 276 lb 3.2 oz (125.3 kg)   BMI 37.99 kg/m  Gen: NAD, alert, cooperative with exam HEENT: NCAT CV: RRR, good S1/S2, no murmur Resp: CTABL, no wheezes, non-labored Ext: No edema, warm Neuro: Alert and oriented, No gross deficits  Assessment and plan:  # Erectile dysfunction Given prescription for Revatio, also refer to urology given age and slowly worsening symptoms for 5-6 months. Checking testosterone, although libido is maintained.  # Hyperlipidemia LDL goal less than 70 with documented CAD, however I see no atherosclerosis mentioned. Given samples of livalo and Rx, coupons online.   # CAD No chest pain, asymptomatic Continue Plavix, Ace, and Statin.     Orders Placed This Encounter  Procedures  . Testosterone    Meds ordered this encounter  Medications  . Pitavastatin Calcium (LIVALO) 4 MG TABS    Sig: Take 1 tablet (4 mg total) by mouth daily.    Dispense:  30 tablet    Refill:  5    Murtis SinkSam Bradshaw, MD [facility] 02/07/2016, 8:18 AM

## 2016-02-07 NOTE — Patient Instructions (Signed)
Great to see you!  Try livalo, There are coupons online.   We will call with labs and send you to a urologist.

## 2016-02-08 ENCOUNTER — Telehealth: Payer: Self-pay

## 2016-02-08 LAB — TESTOSTERONE: Testosterone: 263 ng/dL — ABNORMAL LOW (ref 264–916)

## 2016-02-09 ENCOUNTER — Encounter: Payer: Self-pay | Admitting: Pharmacist Clinician (PhC)/ Clinical Pharmacy Specialist

## 2016-02-09 ENCOUNTER — Ambulatory Visit (INDEPENDENT_AMBULATORY_CARE_PROVIDER_SITE_OTHER): Payer: BLUE CROSS/BLUE SHIELD | Admitting: Pharmacist Clinician (PhC)/ Clinical Pharmacy Specialist

## 2016-02-09 DIAGNOSIS — E785 Hyperlipidemia, unspecified: Secondary | ICD-10-CM | POA: Diagnosis not present

## 2016-02-09 MED ORDER — EZETIMIBE 10 MG PO TABS: 10.0000 mg | ORAL_TABLET | Freq: Every day | ORAL | 2 refills | Status: DC

## 2016-02-09 NOTE — Telephone Encounter (Signed)
Pt notified of results  Verbalizes understanding Will call back to schedule appt 

## 2016-02-09 NOTE — Assessment & Plan Note (Signed)
Patient with mixed hyperlipidemia.  He was just prescribed ezetimibe, but has not yet picked this up.  He would like to pursue Repatha injections as well.  His biggest concern is cost of medications, as his insurance is thru the Affordable Care Act.  He was encouraged to continue decreasing his cigarette use (down from 3 ppd to 1 ppd) and losing weight.  Will follow up with him once we receive information from insurance.

## 2016-02-09 NOTE — Telephone Encounter (Signed)
Will start Zetia and ask that he follow up with our clinical pharmacist in 4-6 weeks to consider PCSK-9 inhibitor.     Murtis SinkSam Judeth Gilles, MD [facility] 02/09/2016, 8:13 AM

## 2016-02-09 NOTE — Patient Instructions (Signed)
Start ezetimibe 10 mg once daily.  We will start the paperwork for Repatha 140 mg injections.   Cholesterol Cholesterol is a fat. Your body needs a small amount of cholesterol. Cholesterol (plaque) may build up in your blood vessels (arteries). That makes you more likely to have a heart attack or stroke. You cannot feel your cholesterol level. Having a blood test is the only way to find out if your level is high. Keep your test results. Work with your doctor to keep your cholesterol at a good level. What do the results mean?  Total cholesterol is how much cholesterol is in your blood.  LDL is bad cholesterol. This is the type that can build up. Try to have low LDL.  HDL is good cholesterol. It cleans your blood vessels and carries LDL away. Try to have high HDL.  Triglycerides are fat that the body can store or burn for energy. What are good levels of cholesterol?  Total cholesterol below 200.  LDL below 100 is good for people who have health risks. LDL below 70 is good for people who have very high risks.  HDL above 40 is good. It is best to have HDL of 60 or higher.  Triglycerides below 150. How can I lower my cholesterol? Diet  Follow your diet program as told by your doctor.  Choose fish, white meat chicken, or [place] that is roasted or baked. Try not to eat red meat, fried foods, sausage, or lunch meats.  Eat lots of fresh fruits and vegetables.  Choose whole grains, beans, pasta, potatoes, and cereals.  Choose olive oil, corn oil, or canola oil. Only use small amounts.  Try not to eat butter, mayonnaise, shortening, or palm kernel oils.  Try not to eat foods with trans fats.  Choose low-fat or nonfat dairy foods.  Drink skim or nonfat milk.  Eat low-fat or nonfat yogurt and cheeses.  Try not to drink whole milk or cream.  Try not to eat ice cream, egg yolks, or full-fat cheeses.  Healthy desserts include angel food cake, ginger snaps, animal crackers, hard  candy, popsicles, and low-fat or nonfat frozen yogurt. Try not to eat pastries, cakes, pies, and cookies. Exercise  Follow your exercise program as told by your doctor.  Be more active. Try gardening, walking, and taking the stairs.  Ask your doctor about ways that you can be more active. Medicine  Take over-the-counter and prescription medicines only as told by your doctor. This information is not intended to replace advice given to you by your health care provider. Make sure you discuss any questions you have with your health care provider. Document Released: 03/23/2008 Document Revised: 07/27/2015 Document Reviewed: 07/07/2015 Elsevier Interactive Patient Education  2017 [employer]

## 2016-02-09 NOTE — Progress Notes (Signed)
02/09/2016 Jason Klinefelterobert G Wentworth 1975-02-24 [id number]   HPI:  Jason KlinefelterRobert G Morris is a 41 y.o. male patient of Dr Jens Somrenshaw, who presents today for a lipid clinic evaluation.  He has a cardiac history significant for a NSTEMI in August of 2014 post heart cath at the age of 41.  He has a 70% lesion in the proximal RCA and 50% in the PDA.  Renal dopplers also show some bilateral stenosis (0-59%).    Current Medications:  None   Cholesterol Goals:   LDL < 70  Intolerant/previously tried:  Atorvastatin 80 mg caused hives as well as myalgias  Rosuvastatin 40 mg caused myalgias, still myalgias with 20 mg dose, but has been willing to suffer thru in order to get lipids down.  Also believes this is cause of his ED  Family history:   Father died from heart disease at age 39, mother living at 6662, has cardiac stents  Diet:   Has just joined [employer] last week.  First weigh-in has lost 10 pounds.    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 clopidogrel (PLAVIX) 75 MG tablet Take 1 tablet (75 mg total) by mouth daily with breakfast. 90 tablet 3  . Coenzyme Q10 (COQ10) 100 MG CAPS Take 1 tablet by mouth daily.    Marland Kitchen. ezetimibe (ZETIA) 10 MG tablet Take 1 tablet (10 mg total) by mouth daily. 30 tablet 2  . lisinopril (PRINIVIL,ZESTRIL) 40 MG tablet Take 0.5 tablets (20 mg total) by mouth 2 (two) times daily. 30 tablet 6  . Multiple Vitamin (MULTIVITAMIN) tablet Take 1 tablet by mouth daily.    . sildenafil (REVATIO) 20 MG tablet Take 2-5 pills once daily as needed for ED 30 tablet 3  . tadalafil (CIALIS) 20 MG tablet Take 1 tablet (20 mg total) by mouth daily as needed for erectile dysfunction. 10 tablet 2   No current facility-administered medications for this visit.      Allergies  Allergen Reactions  . Lipitor [Atorvastatin] Hives  . Lopressor [Metoprolol Tartrate] Shortness Of Breath    Felt like he was going to die , could not breath & syncope  . Eggs Or Egg-Derived Products Other (See Comments)    Really bad stomach pain  . Lactose Intolerance (Gi) Diarrhea    Past Medical History:  Diagnosis Date  . Anxiety   . CAD (coronary artery disease)    a. Chest CTA 8/14 with coronary Ca and RCA aneurysmal dilatation    b. NSTEMI s/p LHC severe aneurysmal dilatation of the RCA, LAD and LCx without flow limiting lesions.  . Chronic lower back pain   . Depression   . GERD (gastroesophageal reflux disease)   . HLD (hyperlipidemia)   . Hypertension   . Migraines   . Tobacco abuse     There were no vitals taken for this visit.   Dyslipidemia Patient with mixed hyperlipidemia.  He was just prescribed ezetimibe, but has not yet picked this up.  He would like to pursue Repatha injections as well.  His biggest concern is cost of medications, as his insurance is thru the Affordable Care Act.  He was encouraged to continue decreasing his cigarette use (down from  3 ppd to 1 ppd) and losing weight.  Will follow up with him once we receive information from insurance.     Phillips Hay PharmD CPP [facility]

## 2016-02-12 ENCOUNTER — Other Ambulatory Visit: Payer: Self-pay | Admitting: Nurse Practitioner

## 2016-02-12 MED ORDER — LISINOPRIL 40 MG PO TABS: 20.0000 mg | ORAL_TABLET | Freq: Two times a day (BID) | ORAL | 3 refills | Status: DC

## 2016-02-12 NOTE — Progress Notes (Signed)
Pt ran out and his regular pharmacy is closed.  He called and asked if [facility] [place] [place]  Chart reviewed.  [facility] w/ [facility]ills.  Creat recently nl.  I sent in Rx for lisinopril 40 mg 1/2 tab bid, # 30, three refills.   Nicolasa Duckinghristopher Maryan Sivak, NP 02/12/2016 3:38 PM

## 2016-02-13 ENCOUNTER — Telehealth: Payer: Self-pay | Admitting: Cardiology

## 2016-02-13 ENCOUNTER — Telehealth: Payer: Self-pay | Admitting: Pharmacist

## 2016-02-13 DIAGNOSIS — E785 Hyperlipidemia, unspecified: Secondary | ICD-10-CM

## 2016-02-13 MED ORDER — EVOLOCUMAB 140 MG/ML ~~LOC~~ SOAJ: 1.0000 "pen " | SUBCUTANEOUS | 11 refills | Status: DC

## 2016-02-13 NOTE — Telephone Encounter (Signed)
[facility]  Patient to call back if unable to afford copay

## 2016-02-13 NOTE — Telephone Encounter (Signed)
Patient received update about repatha prescription.  Will call back if unable to afford copay or if/when medication started.

## 2016-02-13 NOTE — Telephone Encounter (Signed)
New message     Returning the pharmicist call (raquel)

## 2016-02-14 ENCOUNTER — Telehealth: Payer: Self-pay | Admitting: Family Medicine

## 2016-02-14 NOTE — Telephone Encounter (Signed)
Attempted to contact patient- no answer no vm. 

## 2016-02-20 ENCOUNTER — Telehealth: Payer: Self-pay | Admitting: Cardiology

## 2016-02-20 NOTE — Telephone Encounter (Signed)
New message    Pt stating he spoke with pharmacist last week and some medication was going to be sent in for him. He is calling to follow up on this.

## 2016-02-21 NOTE — Telephone Encounter (Signed)
Follow Up    Pt states he was not told where [facility] is and he does not know where to pick this up. He needs someone to call him back with the address  Fleet ContrasRachel

## 2016-02-21 NOTE — Telephone Encounter (Signed)
[facility], [facility].  Patient will need to contact them about pricing and delivery.  If cost prohibitive, he would need to call us, so that we can have him apply for assistance thru [employer].

## 2016-02-21 NOTE — Telephone Encounter (Signed)
Talked to patient . He will contact [facility] and call back with copay information.

## 2016-03-01 NOTE — Telephone Encounter (Signed)
Sent letter with appointment date and time-which is now 3 weeks away and we cannot get one any sooner than that

## 2016-04-06 DIAGNOSIS — J00 Acute nasopharyngitis [common cold]: Secondary | ICD-10-CM | POA: Diagnosis not present

## 2016-04-06 DIAGNOSIS — R05 Cough: Secondary | ICD-10-CM | POA: Diagnosis not present

## 2016-05-17 ENCOUNTER — Telehealth: Payer: Self-pay | Admitting: Cardiology

## 2016-05-17 NOTE — Telephone Encounter (Signed)
Spoke with the patient. He stated that he still hasn't been able to start the Repatha. He stated that he was told by the pharmacy that the prescription had expired. He would like to still take the Repatha but wonders if he needs to be on Crestor until he does start it.

## 2016-05-17 NOTE — Telephone Encounter (Signed)
Pt wants to know what Cholesterol medicine can he take since insurance will not pay for Repatha. He have not been on nothing for just about 3 months,he say he needs to be on something please.Pt said he would prerfer to go back on Crestor.

## 2016-05-21 ENCOUNTER — Other Ambulatory Visit: Payer: Self-pay

## 2016-05-21 MED ORDER — ROSUVASTATIN CALCIUM 20 MG PO TABS: 20.0000 mg | ORAL_TABLET | Freq: Every day | ORAL | 0 refills | Status: DC

## 2016-05-22 ENCOUNTER — Other Ambulatory Visit: Payer: Self-pay | Admitting: Pharmacist

## 2016-05-22 MED ORDER — ROSUVASTATIN CALCIUM 20 MG PO TABS: 20.0000 mg | ORAL_TABLET | ORAL | 1 refills | Status: DC

## 2016-05-22 NOTE — Telephone Encounter (Signed)
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  He has [employer], with a $135/month copay.  His income level is $1200/month - this is not in his budget.  He also does not qualify for copay card as his insurance is government subsidized.

## 2016-05-22 NOTE — Telephone Encounter (Signed)
Patient returned call, spoke with Raquel.  He dropped his [employer] insurance, as he felt it wasn't helping him cut costs.  Will re-submit to [employer] for coverage

## 2016-06-12 ENCOUNTER — Other Ambulatory Visit: Payer: Self-pay | Admitting: *Deleted

## 2016-06-12 MED ORDER — LISINOPRIL 40 MG PO TABS: 20.0000 mg | ORAL_TABLET | Freq: Two times a day (BID) | ORAL | 1 refills | Status: DC

## 2016-06-15 ENCOUNTER — Other Ambulatory Visit: Payer: Self-pay

## 2016-06-15 MED ORDER — LISINOPRIL 40 MG PO TABS: 20.0000 mg | ORAL_TABLET | Freq: Two times a day (BID) | ORAL | 1 refills | Status: DC

## 2016-06-15 NOTE — Telephone Encounter (Signed)
[facility] from [facility] on [facility], contacted patient to verify pharmacy, due to [facility] the 5[facility] of [facility]. [facility] [place]. Rx sent over and patient notified directly.   While on the phone with the patient he wanted to check the status of his Repatha. I told him I would have pharmacist follow up with him. He voiced understanding. Message will be routed to Pharmacist.

## 2016-06-19 NOTE — Telephone Encounter (Signed)
While on the phone with the patient last week he stated he wanted his [facility], aftter speaking with a nurse in triage she advised me to call one of the pharmacies and cancel the Linopril. Since the patient requested [facility] I called and [facility].

## 2016-07-05 NOTE — Telephone Encounter (Signed)
Last patient assistance forms not received by [employer] in May.  Faxed to patient to re-sign and will re-submit

## 2016-08-18 ENCOUNTER — Other Ambulatory Visit: Payer: Self-pay | Admitting: Cardiology

## 2016-08-20 ENCOUNTER — Other Ambulatory Visit: Payer: Self-pay | Admitting: Cardiology

## 2016-08-20 MED ORDER — CLOPIDOGREL BISULFATE 75 MG PO TABS: 75.0000 mg | ORAL_TABLET | Freq: Every day | ORAL | 3 refills | Status: DC

## 2016-08-20 NOTE — Telephone Encounter (Signed)
New message       *STAT* If patient is at the pharmacy, call can be transferred to refill team.   1. Which medications need to be refilled? (please list name of each medication and dose if known)  Generic plavix  2. Which pharmacy/location (including street and city if local pharmacy) is medication to be sent to? [place] [facility] 3. Do they need a 30 day or 90 day supply? 90 day-------pt is out of medication

## 2017-02-09 ENCOUNTER — Other Ambulatory Visit: Payer: Self-pay | Admitting: Family Medicine

## 2017-02-09 ENCOUNTER — Other Ambulatory Visit: Payer: Self-pay | Admitting: Nurse Practitioner

## 2017-02-11 MED ORDER — LISINOPRIL 40 MG PO TABS: 20.0000 mg | ORAL_TABLET | Freq: Two times a day (BID) | ORAL | 0 refills | Status: DC

## 2017-02-11 NOTE — Telephone Encounter (Signed)
Rx(s) sent to pharmacy electronically.  

## 2017-02-11 NOTE — Addendum Note (Signed)
Addended by: Lindell SparELKINS, Larren Copes M on: 02/11/2017 10:31 AM   Modules accepted: Orders

## 2017-06-20 ENCOUNTER — Other Ambulatory Visit: Payer: Self-pay | Admitting: Cardiology

## 2017-08-14 ENCOUNTER — Other Ambulatory Visit: Payer: Self-pay | Admitting: Cardiology

## 2017-08-14 NOTE — Telephone Encounter (Signed)
. °*  STAT* If patient is at the pharmacy, call can be transferred to refill team.   1. Which medications need to be refilled? (please list name of each medication and dose if known) Crestor, Plavix and Lisinopril-Generic for all of these 2. Which pharmacy/location (including street and city if local pharmacy) is medication to be sent to?[place] [phone]  3. Do they need a 30 day or 90 day supply? 90 and refills

## 2017-08-29 ENCOUNTER — Telehealth: Payer: Self-pay | Admitting: Family Medicine

## 2017-08-29 NOTE — Telephone Encounter (Signed)
Patient has been having diarrhea all week, wondering if he should be seen. No abdominal pain or discomfort, or fever.  I advised him to eat a bland diet, stay hydrated and if he was not improved by tomorrow to contact us to be seen.

## 2017-08-29 NOTE — Telephone Encounter (Signed)
Agree with recommendations.  

## 2017-08-30 ENCOUNTER — Ambulatory Visit (INDEPENDENT_AMBULATORY_CARE_PROVIDER_SITE_OTHER): Payer: BLUE CROSS/BLUE SHIELD | Admitting: Family Medicine

## 2017-08-30 ENCOUNTER — Encounter: Payer: Self-pay | Admitting: Family Medicine

## 2017-08-30 VITALS — BP 118/76 | HR 82 | Temp 98.5°F | Ht 71.5 in | Wt 273.0 lb

## 2017-08-30 DIAGNOSIS — K529 Noninfective gastroenteritis and colitis, unspecified: Secondary | ICD-10-CM

## 2017-08-30 NOTE — Progress Notes (Signed)
BP 118/76   Pulse 82   Temp 98.5 F (36.9 C) (Oral)   Ht 5' 11.5" (1.816 m)   Wt 273 lb (123.8 kg)   BMI 37.55 kg/m    Subjective:    Patient ID: Jason Morris, male    DOB: 1975-11-21, 42 y.o.   MRN: [id number]  HPI: Jason Morris is a 42 y.o. male presenting on 08/30/2017 for Diarrhea, abdominal cramping   HPI Abdominal cramping and diarrhea Patient comes in today complaining of abdominal cramping and diarrhea that is been going on for the past 7 days.  He says is been having very yellow stools that are even bright yellow at times and he was concerned about his liver or something along those lines.  He has been having some abdominal bloating and cramping along with it but no significant pains.  He denies any fevers or chills or flank pain or pain rating anywhere else.  He denies any nausea or vomiting but mainly has been having the diarrhea of about 5-6 loose to watery stools every day.  He is also been very gassy.  He does admit that after a bowel movement the cramping gets better.  He thinks this may have all started from when he is on vacation last week and then he also took a new type of probiotic over the weekend and it seems to have started around that time.  Relevant past medical, surgical, family and social history reviewed and updated as indicated. Interim medical history since our last visit reviewed. Allergies and medications reviewed and updated.  Review of Systems  Constitutional: Negative for chills and fever.  Respiratory: Negative for shortness of breath and wheezing.   Cardiovascular: Negative for chest pain and leg swelling.  Gastrointestinal: Positive for abdominal distention and diarrhea. Negative for abdominal pain, constipation, nausea, rectal pain and vomiting.  Musculoskeletal: Negative for back pain and gait problem.  Skin: Negative for rash.  All other systems reviewed and are negative.   Per HPI unless specifically indicated above   Allergies as of  08/30/2017      Reactions   Lipitor [atorvastatin] Hives   Lopressor [metoprolol Tartrate] Shortness Of Breath   Felt like he was going to die , could not breath & syncope   Eggs Or Egg-derived Products Other (See Comments)   Really bad stomach pain   Lactose Intolerance (gi) Diarrhea      Medication List        Accurate as of 08/30/17  6:08 PM. Always use your most recent med list.          clopidogrel 75 MG tablet Commonly known as:  PLAVIX TAKE (1) TABLET DAILY WITH BREAKFAST.   CoQ10 100 MG Caps Take 1 tablet by mouth daily.   ezetimibe 10 MG tablet Commonly known as:  ZETIA Take 1 tablet (10 mg total) by mouth daily.   lisinopril 40 MG tablet Commonly known as:  PRINIVIL,ZESTRIL Take 0.5 tablets (20 mg total) by mouth 2 (two) times daily. <PLEASE MAKE APPOINTMENT FOR REFILLS>   multivitamin tablet Take 1 tablet by mouth daily.   rosuvastatin 20 MG tablet Commonly known as:  CRESTOR TAKE 1 TABLET 3 TIMES A WEEK   sildenafil 20 MG tablet Commonly known as:  REVATIO Take 2-5 pills once daily as needed for ED          Objective:    BP 118/76   Pulse 82   Temp 98.5 F (36.9 C) (Oral)  Ht 5' 11.5" (1.816 m)   Wt 273 lb (123.8 kg)   BMI 37.55 kg/m   Wt Readings from Last 3 Encounters:  08/30/17 273 lb (123.8 kg)  02/07/16 276 lb 3.2 oz (125.3 kg)  01/27/16 285 lb 9.6 oz (129.5 kg)    Physical Exam  Constitutional: He is oriented to person, place, and time. He appears well-developed and well-nourished. No distress.  Eyes: Conjunctivae are normal. No scleral icterus.  Cardiovascular: Normal rate, regular rhythm, normal heart sounds and intact distal pulses.  No murmur heard. Pulmonary/Chest: Effort normal and breath sounds normal. No respiratory distress. He has no wheezes.  Abdominal: Soft. Bowel sounds are normal. He exhibits distension (Slightly bloated). He exhibits no mass. There is no tenderness. There is no guarding.  Neurological: He is alert  and oriented to person, place, and time. Coordination normal.  Skin: Skin is warm and dry. No rash noted. He is not diaphoretic.  Psychiatric: He has a normal mood and affect. His behavior is normal.  Nursing note and vitals reviewed.       Assessment & Plan:   Problem List Items Addressed This Visit    None    Visit Diagnoses    Gastroenteritis    -  Primary   Likely viral, patient is having yellow stools so we will test a CBC and a chemistry panel.  Consider stool studies next week if still having symptoms   Relevant Orders   CBC with Differential/Platelet   CMP14+EGFR       Follow up plan: Return if symptoms worsen or fail to improve.  Counseling provided for all of the vaccine components Orders Placed This Encounter  Procedures  . CBC with Differential/Platelet  . Hendricks Terrionna Bridwell, MD [facility] 08/30/2017, 6:08 PM

## 2017-09-01 LAB — CBC WITH DIFFERENTIAL/PLATELET
BASOS ABS: 0 10*3/uL (ref 0.0–0.2)
Basos: 0 %
EOS (ABSOLUTE): 0.1 10*3/uL (ref 0.0–0.4)
Eos: 2 %
Hematocrit: 46 % (ref 37.5–51.0)
Hemoglobin: 15.4 g/dL (ref 13.0–17.7)
IMMATURE GRANS (ABS): 0.1 10*3/uL (ref 0.0–0.1)
IMMATURE GRANULOCYTES: 1 %
LYMPHS: 36 %
Lymphocytes Absolute: 2.9 10*3/uL (ref 0.7–3.1)
MCH: 29.8 pg (ref 26.6–33.0)
MCHC: 33.5 g/dL (ref 31.5–35.7)
MCV: 89 fL (ref 79–97)
Monocytes Absolute: 0.7 10*3/uL (ref 0.1–0.9)
Monocytes: 9 %
NEUTROS ABS: 4.3 10*3/uL (ref 1.4–7.0)
NEUTROS PCT: 52 %
PLATELETS: 203 10*3/uL (ref 150–450)
RBC: 5.17 x10E6/uL (ref 4.14–5.80)
RDW: 12.3 % (ref 12.3–15.4)
WBC: 8.1 10*3/uL (ref 3.4–10.8)

## 2017-09-01 LAB — CMP14+EGFR
A/G RATIO: 2 (ref 1.2–2.2)
ALT: 42 IU/L (ref 0–44)
AST: 26 IU/L (ref 0–40)
Albumin: 4.6 g/dL (ref 3.5–5.5)
Alkaline Phosphatase: 56 IU/L (ref 39–117)
BILIRUBIN TOTAL: 0.3 mg/dL (ref 0.0–1.2)
BUN/Creatinine Ratio: 10 (ref 9–20)
BUN: 11 mg/dL (ref 6–24)
CHLORIDE: 101 mmol/L (ref 96–106)
CO2: 18 mmol/L — ABNORMAL LOW (ref 20–29)
Calcium: 9.4 mg/dL (ref 8.7–10.2)
Creatinine, Ser: 1.1 mg/dL (ref 0.76–1.27)
GFR calc Af Amer: 97 mL/min/{1.73_m2} (ref >59)
GFR calc non Af Amer: 84 mL/min/{1.73_m2} (ref >59)
Globulin, Total: 2.3 g/dL (ref 1.5–4.5)
Glucose: 79 mg/dL (ref 65–99)
POTASSIUM: 4.8 mmol/L (ref 3.5–5.2)
Sodium: 140 mmol/L (ref 134–144)
Total Protein: 6.9 g/dL (ref 6.0–8.5)

## 2017-09-02 ENCOUNTER — Telehealth: Payer: Self-pay | Admitting: Family Medicine

## 2017-09-02 NOTE — Telephone Encounter (Signed)
Patient aware of lab results.

## 2017-09-17 ENCOUNTER — Other Ambulatory Visit: Payer: Self-pay | Admitting: Cardiovascular Disease

## 2017-11-04 ENCOUNTER — Telehealth: Payer: Self-pay | Admitting: Family Medicine

## 2017-11-04 ENCOUNTER — Other Ambulatory Visit: Payer: Self-pay | Admitting: Cardiology

## 2017-11-04 NOTE — Telephone Encounter (Signed)
Fill by Dr Jens Som

## 2017-11-25 ENCOUNTER — Other Ambulatory Visit: Payer: Self-pay | Admitting: Cardiology

## 2017-11-25 ENCOUNTER — Telehealth: Payer: Self-pay

## 2017-11-25 NOTE — Telephone Encounter (Signed)
Error

## 2017-11-25 NOTE — Telephone Encounter (Signed)
Patient states he was lifting some yesterday and felt like he pulled something in his lower back.  Patient states that he is still having lower back pain today depending on how he moves he feels it pull and it radiates down to his groin area.  Patient states it has improved with some ibuprofen.  He is self pay and wanting to know what the doctor suggest he should do since he can not afford to miss work and pay to be seen. Advised patient that he should come in to be seen and patient would like to see if there is a way around it since he is self pay.    Please advise

## 2017-11-25 NOTE — Telephone Encounter (Signed)
I would recommend doing ice and heat and taking ibuprofen 800 mg twice a day and stretching over the next week or 2, if it continues to be a big issue and is not improving over the next week or 2 then come in and see us.

## 2017-11-29 NOTE — Telephone Encounter (Signed)
Aware of recommendations  

## 2017-12-24 ENCOUNTER — Other Ambulatory Visit: Payer: Self-pay | Admitting: Cardiology

## 2017-12-24 ENCOUNTER — Other Ambulatory Visit: Payer: Self-pay | Admitting: Family Medicine

## 2017-12-24 MED ORDER — LISINOPRIL 40 MG PO TABS: 20.0000 mg | ORAL_TABLET | Freq: Two times a day (BID) | ORAL | 0 refills | Status: DC

## 2017-12-24 MED ORDER — ROSUVASTATIN CALCIUM 20 MG PO TABS: 20.0000 mg | ORAL_TABLET | ORAL | 0 refills | Status: DC

## 2017-12-24 NOTE — Telephone Encounter (Signed)
Pt aware 30 day supply is all that can be refilled till his appt w/ Dr. Louanne Skyeettinger on 01/27/18

## 2017-12-24 NOTE — Telephone Encounter (Signed)
What is the name of the medication? Plavix and Lisinopril and Crestor. Needs 90 Day and generic  Have you contacted your pharmacy to request a refill? yes  [facility]   Patient notified that their request is being sent to the clinical staff for review and that they should receive a call once it is complete. If they do not receive a call within 24 hours they can check with their pharmacy or our office.

## 2017-12-25 ENCOUNTER — Other Ambulatory Visit: Payer: Self-pay | Admitting: Cardiology

## 2018-01-14 ENCOUNTER — Other Ambulatory Visit: Payer: Self-pay | Admitting: *Deleted

## 2018-01-14 MED ORDER — CLOPIDOGREL BISULFATE 75 MG PO TABS: 75.0000 mg | ORAL_TABLET | Freq: Every day | ORAL | 0 refills | Status: DC

## 2018-01-20 ENCOUNTER — Other Ambulatory Visit: Payer: Self-pay | Admitting: Family Medicine

## 2018-01-20 MED ORDER — CLOPIDOGREL BISULFATE 75 MG PO TABS: 75.0000 mg | ORAL_TABLET | Freq: Every day | ORAL | 0 refills | Status: DC

## 2018-01-20 NOTE — Telephone Encounter (Signed)
Is it okay to refill? Please advise. 

## 2018-01-27 ENCOUNTER — Encounter: Payer: Self-pay | Admitting: Family Medicine

## 2018-01-27 ENCOUNTER — Ambulatory Visit (INDEPENDENT_AMBULATORY_CARE_PROVIDER_SITE_OTHER): Payer: BLUE CROSS/BLUE SHIELD | Admitting: Family Medicine

## 2018-01-27 VITALS — BP 138/82 | HR 87 | Temp 97.1°F | Ht 71.5 in | Wt 287.8 lb

## 2018-01-27 DIAGNOSIS — R635 Abnormal weight gain: Secondary | ICD-10-CM | POA: Diagnosis not present

## 2018-01-27 DIAGNOSIS — K219 Gastro-esophageal reflux disease without esophagitis: Secondary | ICD-10-CM | POA: Diagnosis not present

## 2018-01-27 DIAGNOSIS — R5383 Other fatigue: Secondary | ICD-10-CM | POA: Diagnosis not present

## 2018-01-27 DIAGNOSIS — R7309 Other abnormal glucose: Secondary | ICD-10-CM

## 2018-01-27 DIAGNOSIS — I10 Essential (primary) hypertension: Secondary | ICD-10-CM | POA: Diagnosis not present

## 2018-01-27 DIAGNOSIS — E785 Hyperlipidemia, unspecified: Secondary | ICD-10-CM | POA: Diagnosis not present

## 2018-01-27 MED ORDER — PRAVASTATIN SODIUM 20 MG PO TABS: 20.0000 mg | ORAL_TABLET | Freq: Every day | ORAL | 3 refills | Status: DC

## 2018-01-27 MED ORDER — CLOPIDOGREL BISULFATE 75 MG PO TABS: 75.0000 mg | ORAL_TABLET | Freq: Every day | ORAL | 3 refills | Status: DC

## 2018-01-27 MED ORDER — LISINOPRIL 40 MG PO TABS: 20.0000 mg | ORAL_TABLET | Freq: Two times a day (BID) | ORAL | 3 refills | Status: DC

## 2018-01-27 NOTE — Progress Notes (Signed)
BP 138/82   Pulse 87   Temp (!) 97.1 F (36.2 C) (Oral)   Ht 5' 11.5" (1.816 m)   Wt 287 lb 12.8 oz (130.5 kg)   BMI 39.58 kg/m    Subjective:    Patient ID: Jason Morris, male    DOB: 1975-01-13, 43 y.o.   MRN: [id number]  HPI: Jason Morris is a 43 y.o. male presenting on 01/27/2018 for Hypertension (check up of chronic medical conditons) and Hyperlipidemia   HPI Hypertension Patient is currently on lisinopril, and their blood pressure today is 138/82. Patient denies any lightheadedness or dizziness. Patient denies headaches, blurred vision, chest pains, shortness of breath, or weakness. Denies any side effects from medication and is content with current medication.   GERD Patient is currently on Dexilant and was taking Zantac but it got on recall.  She denies any major symptoms or abdominal pain or belching or burping. She denies any blood in her stool or lightheadedness or dizziness.   Hyperlipidemia Patient is coming in for recheck of his hyperlipidemia. The patient is currently taking Crestor but he feels like the Crestor is causing him issues with erectile dysfunction and decreased energy. They deny any issues with myalgias or history of liver damage from it. They deny any focal numbness or weakness or chest pain.   Fatigue and weight gain Patient is coming in for weight gain and fatigue.  He says over the past year he has gained quite a few pounds and he thinks maybe even up to 60 pounds.  We have been as gaining closer to 15 pounds since August of last year 5 months ago.  He just feels tired and has been waking at night.  He does admit to snoring but he says he just had a sleep apnea test and it was normal.  He is a Administrator.  He does admit that he does not eat well all the time.  He does take his Crestor and feels like that is attributing to his fatigue and he feels like it is also attributing to his erectile dysfunction.  He says he only take it every other day on the  days that he takes it he feels like it causes him a lot of issues.  He says his previously taken pravastatin and he cannot recall any issues with the pravastatin previously.  Relevant past medical, surgical, family and social history reviewed and updated as indicated. Interim medical history since our last visit reviewed. Allergies and medications reviewed and updated.  Review of Systems  Constitutional: Positive for fatigue and unexpected weight change. Negative for chills and fever.  Respiratory: Negative for shortness of breath and wheezing.   Cardiovascular: Negative for chest pain and leg swelling.  Musculoskeletal: Negative for back pain and gait problem.  Skin: Negative for rash.  Neurological: Negative for dizziness, weakness, light-headedness, numbness and headaches.  Psychiatric/Behavioral: Positive for sleep disturbance. Negative for decreased concentration, dysphoric mood, self-injury and suicidal ideas.  All other systems reviewed and are negative.   Per HPI unless specifically indicated above   Allergies as of 01/27/2018      Reactions   Lipitor [atorvastatin] Hives   Lopressor [metoprolol Tartrate] Shortness Of Breath   Felt like he was going to die , could not breath & syncope   Eggs Or Egg-derived Products Other (See Comments)   Really bad stomach pain   Lactose Intolerance (gi) Diarrhea      Medication List  Accurate as of January 27, 2018  1:35 PM. Always use your most recent med list.        clopidogrel 75 MG tablet Commonly known as:  PLAVIX Take 1 tablet (75 mg total) by mouth daily.   CoQ10 100 MG Caps Take 1 tablet by mouth daily.   lisinopril 40 MG tablet Commonly known as:  PRINIVIL,ZESTRIL Take 0.5 tablets (20 mg total) by mouth 2 (two) times daily.   multivitamin tablet Take 1 tablet by mouth daily.   pravastatin 20 MG tablet Commonly known as:  PRAVACHOL Take 1 tablet (20 mg total) by mouth daily.   rosuvastatin 20 MG  tablet Commonly known as:  CRESTOR Take 1 tablet (20 mg total) by mouth 3 (three) times a week.          Objective:    BP 138/82   Pulse 87   Temp (!) 97.1 F (36.2 C) (Oral)   Ht 5' 11.5" (1.816 m)   Wt 287 lb 12.8 oz (130.5 kg)   BMI 39.58 kg/m   Wt Readings from Last 3 Encounters:  01/27/18 287 lb 12.8 oz (130.5 kg)  08/30/17 273 lb (123.8 kg)  02/07/16 276 lb 3.2 oz (125.3 kg)    Physical Exam Vitals signs and nursing note reviewed.  Constitutional:      General: He is not in acute distress.    Appearance: He is well-developed. He is not diaphoretic.  Eyes:     General: No scleral icterus.    Conjunctiva/sclera: Conjunctivae normal.  Neck:     Musculoskeletal: Neck supple.     Thyroid: No thyromegaly.  Cardiovascular:     Rate and Rhythm: Normal rate and regular rhythm.     Heart sounds: Normal heart sounds. No murmur.  Pulmonary:     Effort: Pulmonary effort is normal. No respiratory distress.     Breath sounds: Normal breath sounds. No wheezing.  Musculoskeletal: Normal range of motion.  Lymphadenopathy:     Cervical: No cervical adenopathy.  Skin:    General: Skin is warm and dry.     Findings: No rash.  Neurological:     Mental Status: He is alert and oriented to person, place, and time.     Coordination: Coordination normal.  Psychiatric:        Behavior: Behavior normal.         Assessment & Plan:   Problem List Items Addressed This Visit      Cardiovascular and Mediastinum   Essential hypertension - Primary   Relevant Medications   lisinopril (PRINIVIL,ZESTRIL) 40 MG tablet   pravastatin (PRAVACHOL) 20 MG tablet   Other Relevant Orders   CMP14+EGFR     Digestive   GERD (gastroesophageal reflux disease)     Other   Dyslipidemia   Relevant Medications   pravastatin (PRAVACHOL) 20 MG tablet   Other Relevant Orders   Lipid panel    Other Visit Diagnoses    Other fatigue       Relevant Orders   TSH   Weight gain       Relevant  Orders   TSH      Encourage dietary and lifestyle modification to help with weight gain and fatigue, patient says he is already had a sleep study this year and it was negative. We will switch from Crestor to pravastatin because patient feels like it is the thing that is causing him energy problems.  He says he also feels like the Crestor is caused him  erectile dysfunction issues.  Follow up plan: Return in about 6 months (around 07/28/2018), or if symptoms worsen or fail to improve, for Recheck cholesterol hypertension and fatigue.  Counseling provided for all of the vaccine components Orders Placed This Encounter  Procedures  . CMP14+EGFR  . Lipid panel  . TSH    Caryl Pina, MD [facility] 01/27/2018, 1:35 PM

## 2018-01-28 LAB — CMP14+EGFR
ALK PHOS: 58 IU/L (ref 39–117)
ALT: 35 IU/L (ref 0–44)
AST: 24 IU/L (ref 0–40)
Albumin/Globulin Ratio: 2 (ref 1.2–2.2)
Albumin: 4.4 g/dL (ref 4.0–5.0)
BUN/Creatinine Ratio: 9 (ref 9–20)
BUN: 9 mg/dL (ref 6–24)
Bilirubin Total: 0.3 mg/dL (ref 0.0–1.2)
CO2: 22 mmol/L (ref 20–29)
Calcium: 9.5 mg/dL (ref 8.7–10.2)
Chloride: 102 mmol/L (ref 96–106)
Creatinine, Ser: 0.97 mg/dL (ref 0.76–1.27)
GFR calc Af Amer: 112 mL/min/{1.73_m2} (ref >59)
GFR calc non Af Amer: 97 mL/min/{1.73_m2} (ref >59)
Globulin, Total: 2.2 g/dL (ref 1.5–4.5)
Glucose: 111 mg/dL — ABNORMAL HIGH (ref 65–99)
Potassium: 4.6 mmol/L (ref 3.5–5.2)
Sodium: 138 mmol/L (ref 134–144)
Total Protein: 6.6 g/dL (ref 6.0–8.5)

## 2018-01-28 LAB — LIPID PANEL
Chol/HDL Ratio: 8.3 ratio — ABNORMAL HIGH (ref 0.0–5.0)
Cholesterol, Total: 199 mg/dL (ref 100–199)
HDL: 24 mg/dL — AB (ref >39)
LDL Calculated: 97 mg/dL (ref 0–99)
Triglycerides: 389 mg/dL — ABNORMAL HIGH (ref 0–149)
VLDL Cholesterol Cal: 78 mg/dL — ABNORMAL HIGH (ref 5–40)

## 2018-01-28 LAB — TSH: TSH: 1.36 u[IU]/mL (ref 0.450–4.500)

## 2018-01-29 LAB — BAYER DCA HB A1C WAIVED: HB A1C (BAYER DCA - WAIVED): 6 % (ref <7.0)

## 2018-01-29 NOTE — Addendum Note (Signed)
Addended by: Angela Adam on: 01/29/2018 04:44 PM   Modules accepted: Orders

## 2018-01-29 NOTE — Addendum Note (Signed)
Addended by: Margorie John on: 01/29/2018 04:44 PM   Modules accepted: Orders

## 2018-03-26 ENCOUNTER — Other Ambulatory Visit: Payer: Self-pay | Admitting: Family Medicine

## 2018-03-26 MED ORDER — ROSUVASTATIN CALCIUM 20 MG PO TABS: 20.0000 mg | ORAL_TABLET | Freq: Every day | ORAL | 0 refills | Status: DC

## 2018-03-26 NOTE — Telephone Encounter (Signed)
Pt aware of MD feedback and voiced understanding. Rx for Crestor sent to pharmacy as requested.

## 2018-03-26 NOTE — Telephone Encounter (Signed)
Patient states that he is more fatigue, elevated bp 140s/90s since starting pravastatin. Previously on crestor.

## 2018-03-26 NOTE — Telephone Encounter (Signed)
That is fine if he wants to switch back to the Crestor, go ahead and send him the Crestor at the dose that he was on previously for 90-day supply.  I do not think that would change in his blood pressure and if it continues to remain elevated that we may need to adjust his blood pressure medications.

## 2018-03-26 NOTE — Addendum Note (Signed)
Addended by: Margurite Auerbach on: 03/26/2018 06:40 PM   Modules accepted: Orders

## 2018-05-01 ENCOUNTER — Other Ambulatory Visit: Payer: Self-pay

## 2018-05-01 ENCOUNTER — Ambulatory Visit (INDEPENDENT_AMBULATORY_CARE_PROVIDER_SITE_OTHER): Payer: BLUE CROSS/BLUE SHIELD | Admitting: Family Medicine

## 2018-05-01 ENCOUNTER — Encounter: Payer: Self-pay | Admitting: Family Medicine

## 2018-05-01 DIAGNOSIS — R7303 Prediabetes: Secondary | ICD-10-CM | POA: Diagnosis not present

## 2018-05-01 DIAGNOSIS — G44209 Tension-type headache, unspecified, not intractable: Secondary | ICD-10-CM

## 2018-05-01 DIAGNOSIS — E1169 Type 2 diabetes mellitus with other specified complication: Secondary | ICD-10-CM | POA: Insufficient documentation

## 2018-05-01 MED ORDER — METFORMIN HCL 500 MG PO TABS: 500.0000 mg | ORAL_TABLET | Freq: Two times a day (BID) | ORAL | 3 refills | Status: DC

## 2018-05-01 NOTE — Progress Notes (Signed)
Virtual Visit via telephone Note  I connected with Jason Morris on 05/01/18 at 1444 by telephone and verified that I am speaking with the correct person using two identifiers. ARVEY Morris is currently located at home and no other people are currently with her during visit. The provider, Elige Radon Dettinger, MD is located in their office at time of visit.  Call ended at [phone]  I discussed the limitations, risks, security and privacy concerns of performing an evaluation and management service by telephone and the availability of in person appointments. I also discussed with the patient that there may be a patient responsible charge related to this service. The patient expressed understanding and agreed to proceed.   History and Present Illness: Patient is having headaches and his ibuprofen is helping but he is concerned because his blood sugar has been 200 and 180 and blood pressure was elevated but was active.  He has been more sedentary because of the coronavirus and has been eating differently he admits to and he will try and change his diet and see if he can do better and see if he can get it under control.  For the headaches recommended conservative management and stretching and exercises, sounds like tension headaches  No diagnosis found.  Outpatient Encounter Medications as of 05/01/2018  Medication Sig  . clopidogrel (PLAVIX) 75 MG tablet Take 1 tablet (75 mg total) by mouth daily.  . Coenzyme Q10 (COQ10) 100 MG CAPS Take 1 tablet by mouth daily.  Marland Kitchen lisinopril (PRINIVIL,ZESTRIL) 40 MG tablet Take 0.5 tablets (20 mg total) by mouth 2 (two) times daily.  . Multiple Vitamin (MULTIVITAMIN) tablet Take 1 tablet by mouth daily.  . rosuvastatin (CRESTOR) 20 MG tablet Take 1 tablet (20 mg total) by mouth daily.  . [DISCONTINUED] atorvastatin (LIPITOR) 80 MG tablet Take 1 tablet (80 mg total) by mouth daily at 6 PM.   No facility-administered encounter medications on file as of 05/01/2018.     Review of Systems  Constitutional: Negative for chills and fever.  Respiratory: Negative for shortness of breath and wheezing.   Cardiovascular: Negative for chest pain and leg swelling.  Gastrointestinal: Negative for abdominal pain.  Musculoskeletal: Negative for back pain and gait problem.  Skin: Negative for rash.  Neurological: Positive for headaches. Negative for dizziness, weakness and numbness.  All other systems reviewed and are negative.   Observations/Objective: Patient sounds comfortable on the phone and in no acute distress  Assessment and Plan: Problem List Items Addressed This Visit      Other   Prediabetes   Relevant Medications   metFORMIN (GLUCOPHAGE) 500 MG tablet      Because his blood sugars been running up will go ahead and start metformin and have him follow-up in 3 months and see how this goes, if he has any issues call back sooner Follow Up Instructions:  Follow up in 3 months   I discussed the assessment and treatment plan with the patient. The patient was provided an opportunity to ask questions and all were answered. The patient agreed with the plan and demonstrated an understanding of the instructions.   The patient was advised to call back or seek an in-person evaluation if the symptoms worsen or if the condition fails to improve as anticipated.  The above assessment and management plan was discussed with the patient. The patient verbalized understanding of and has agreed to the management plan. Patient is aware to call the clinic if symptoms persist or  worsen. Patient is aware when to return to the clinic for a follow-up visit. Patient educated on when it is appropriate to go to the emergency department.    I provided 22 minutes of non-face-to-face time during this encounter.    Nils PyleJoshua A Dettinger, MD

## 2018-06-27 ENCOUNTER — Encounter: Payer: Self-pay | Admitting: Family Medicine

## 2018-06-27 ENCOUNTER — Telehealth: Payer: Self-pay | Admitting: Family Medicine

## 2018-06-27 ENCOUNTER — Other Ambulatory Visit: Payer: Self-pay

## 2018-06-27 ENCOUNTER — Ambulatory Visit (INDEPENDENT_AMBULATORY_CARE_PROVIDER_SITE_OTHER): Payer: BC Managed Care – PPO | Admitting: Family Medicine

## 2018-06-27 DIAGNOSIS — H9201 Otalgia, right ear: Secondary | ICD-10-CM | POA: Diagnosis not present

## 2018-06-27 MED ORDER — AMOXICILLIN-POT CLAVULANATE 875-125 MG PO TABS: 1.0000 | ORAL_TABLET | Freq: Two times a day (BID) | ORAL | 0 refills | Status: AC

## 2018-06-27 NOTE — Telephone Encounter (Signed)
Patient aware and verbalizes understanding. 

## 2018-06-27 NOTE — Telephone Encounter (Signed)
He was placed on appropriate antibiotics. Tylenol or motrin as needed for fever control.

## 2018-06-27 NOTE — Telephone Encounter (Signed)
Pt states he has a fever of 101 and now and sore throat, sinus pressure that just started all of a sudden. Please advise?

## 2018-06-27 NOTE — Progress Notes (Signed)
Virtual Visit via telephone Note Due to COVID-19, visit is conducted virtually and was requested by patient. This visit type was conducted due to national recommendations for restrictions regarding the COVID-19 Pandemic (e.g. social distancing) in an effort to limit this patient's exposure and mitigate transmission in our community. All issues noted in this document were discussed and addressed.  A physical exam was not performed with this format.   I connected with Jason Klinefelterobert G Morris on 06/27/18 at 1050 by telephone and verified that I am speaking with the correct person using two identifiers. Jason Morris is currently located at work and no one is currently with them during visit. The provider, Kari BaarsMichelle , FNP is located in their office at time of visit.  I discussed the limitations, risks, security and privacy concerns of performing an evaluation and management service by telephone and the availability of in person appointments. I also discussed with the patient that there may be a patient responsible charge related to this service. The patient expressed understanding and agreed to proceed.  Subjective:  Patient ID: Jason Klinefelterobert G Morris, male    DOB: 05-13-1975, 43 y.o.   MRN: [id number]  Chief Complaint:  Otalgia   HPI: Jason Morris is a 43 y.o. male presenting on 06/27/2018 for Otalgia   Pt reports 3 days of right otalgia with pain radiating into his neck. Pt states he has a previous history of ear infections. States he has not had a fever. No chills, headaches, weakness, of confusion. No drainage from the ear.   Otalgia  There is pain in the right ear. This is a new problem. The current episode started in the past 7 days. The problem occurs constantly. The problem has been gradually worsening. There has been no fever. The pain is at a severity of 5/10. The pain is moderate. Associated symptoms include a sore throat. Pertinent negatives include no abdominal pain, coughing, diarrhea, ear  discharge, headaches, hearing loss, neck pain, rash, rhinorrhea or vomiting. He has tried acetaminophen for the symptoms. The treatment provided no relief.     Relevant past medical, surgical, family, and social history reviewed and updated as indicated.  Allergies and medications reviewed and updated.   Past Medical History:  Diagnosis Date  . Anxiety   . CAD (coronary artery disease)    a. Chest CTA 8/14 with coronary Ca and RCA aneurysmal dilatation    b. NSTEMI s/p LHC severe aneurysmal dilatation of the RCA, LAD and LCx without flow limiting lesions.  . Chronic lower back pain   . Depression   . GERD (gastroesophageal reflux disease)   . HLD (hyperlipidemia)   . Hypertension   . Migraines   . Tobacco abuse     Past Surgical History:  Procedure Laterality Date  . LEFT HEART CATH N/A 02/21/2014   Procedure: LEFT HEART CATH;  Surgeon: Kathleene Hazelhristopher D McAlhany, MD;  [facility];  Service: Cardiovascular;  Laterality: N/A;  . MYRINGOTOMY WITH TUBE PLACEMENT Bilateral    "4-5 times as a child" (08/09/2012)    Social History   Socioeconomic History  . Marital status: Divorced    Spouse name: Not on file  . Number of children: Not on file  . Years of education: Not on file  . Highest education level: Not on file  Occupational History  . Not on file  Social Needs  . Financial resource strain: Not on file  . Food insecurity    Worry: Not on file  Inability: Not on file  . Transportation needs    Medical: Not on file    Non-medical: Not on file  Tobacco Use  . Smoking status: Heavy Tobacco Smoker    Packs/day: 1.00    Years: 17.00    Pack years: 17.00    Types: Cigarettes  . Smokeless tobacco: Never Used  . Tobacco comment: 08/09/2012 "smoked 3 ppd til 1 month ago"   Substance and Sexual Activity  . Alcohol use: No    Alcohol/week: 0.0 standard drinks    Comment: 08/09/2012 "had a mixed drink once in the past year"  . Drug use: No  . Sexual activity: Yes   Lifestyle  . Physical activity    Days per week: Not on file    Minutes per session: Not on file  . Stress: Not on file  Relationships  . Social Herbalist on phone: Not on file    Gets together: Not on file    Attends religious service: Not on file    Active member of club or organization: Not on file    Attends meetings of clubs or organizations: Not on file    Relationship status: Not on file  . Intimate partner violence    Fear of current or ex partner: Not on file    Emotionally abused: Not on file    Physically abused: Not on file    Forced sexual activity: Not on file  Other Topics Concern  . Not on file  Social History Narrative  . Not on file    Outpatient Encounter Medications as of 06/27/2018  Medication Sig  . amoxicillin-clavulanate (AUGMENTIN) 875-125 MG tablet Take 1 tablet by mouth 2 (two) times daily for 10 days.  . clopidogrel (PLAVIX) 75 MG tablet Take 1 tablet (75 mg total) by mouth daily.  . Coenzyme Q10 (COQ10) 100 MG CAPS Take 1 tablet by mouth daily.  Marland Kitchen L-Arginine 1000 MG TABS Take 1 tablet by mouth daily.  Marland Kitchen lisinopril (PRINIVIL,ZESTRIL) 40 MG tablet Take 0.5 tablets (20 mg total) by mouth 2 (two) times daily.  . metFORMIN (GLUCOPHAGE) 500 MG tablet Take 1 tablet (500 mg total) by mouth 2 (two) times daily with a meal.  . Multiple Vitamin (MULTIVITAMIN) tablet Take 1 tablet by mouth daily.  . rosuvastatin (CRESTOR) 20 MG tablet Take 1 tablet (20 mg total) by mouth daily.  . [DISCONTINUED] atorvastatin (LIPITOR) 80 MG tablet Take 1 tablet (80 mg total) by mouth daily at 6 PM.   No facility-administered encounter medications on file as of 06/27/2018.     Allergies  Allergen Reactions  . Lipitor [Atorvastatin] Hives  . Lopressor [Metoprolol Tartrate] Shortness Of Breath    Felt like he was going to die , could not breath & syncope  . Eggs Or Egg-Derived Products Other (See Comments)    Really bad stomach pain  . Lactose Intolerance (Gi)  Diarrhea    Review of Systems  Constitutional: Negative for chills, fatigue and fever.  HENT: Positive for ear pain and sore throat. Negative for congestion, dental problem, drooling, ear discharge, facial swelling, hearing loss, mouth sores, nosebleeds, postnasal drip, rhinorrhea, sinus pressure, sinus pain, sneezing, tinnitus, trouble swallowing and voice change.   Respiratory: Negative for cough and shortness of breath.   Cardiovascular: Negative for chest pain, palpitations and leg swelling.  Gastrointestinal: Negative for abdominal pain, diarrhea and vomiting.  Musculoskeletal: Negative for neck pain.  Skin: Negative for rash.  Neurological: Negative for dizziness, weakness,  light-headedness and headaches.  Psychiatric/Behavioral: Negative for confusion.  All other systems reviewed and are negative.        Observations/Objective: No vital signs or physical exam, this was a telephone or virtual health encounter.  Pt alert and oriented, answers all questions appropriately, and able to speak in full sentences.    Assessment and Plan: Jason MaduroRobert was seen today for otalgia.  Diagnoses and all orders for this visit:  Otalgia of right ear Reported symptoms consistent with AOM of right ear. Symptomatic care discussed. Will empirically treat with below. Report any new or worsening symptoms.  -     amoxicillin-clavulanate (AUGMENTIN) 875-125 MG tablet; Take 1 tablet by mouth 2 (two) times daily for 10 days.     Follow Up Instructions: Return in about 2 weeks (around 07/11/2018), or if symptoms worsen or fail to improve, for Otalgia.    I discussed the assessment and treatment plan with the patient. The patient was provided an opportunity to ask questions and all were answered. The patient agreed with the plan and demonstrated an understanding of the instructions.   The patient was advised to call back or seek an in-person evaluation if the symptoms worsen or if the condition fails to  improve as anticipated.  The above assessment and management plan was discussed with the patient. The patient verbalized understanding of and has agreed to the management plan. Patient is aware to call the clinic if symptoms persist or worsen. Patient is aware when to return to the clinic for a follow-up visit. Patient educated on when it is appropriate to go to the emergency department.    I provided 15 minutes of non-face-to-face time during this encounter. The call started at 1050. The call ended at 1105. The other time was used for coordination of care.    Kari BaarsMichelle , FNP-C [facility] [address] [place], [place] [address] [phone]

## 2018-06-30 ENCOUNTER — Telehealth: Payer: Self-pay | Admitting: Family Medicine

## 2018-06-30 ENCOUNTER — Other Ambulatory Visit: Payer: Self-pay | Admitting: Family Medicine

## 2018-06-30 DIAGNOSIS — H60313 Diffuse otitis externa, bilateral: Secondary | ICD-10-CM

## 2018-06-30 MED ORDER — NEOMYCIN-POLYMYXIN-HC 3.5-10000-1 OT SUSP: 3.0000 [drp] | Freq: Four times a day (QID) | OTIC | 0 refills | Status: AC

## 2018-06-30 NOTE — Telephone Encounter (Signed)
Patient aware and verbalizes understanding. 

## 2018-06-30 NOTE — Telephone Encounter (Signed)
I will send in cortisporin drops.

## 2018-06-30 NOTE — Telephone Encounter (Signed)
What symptoms do you have? Finished ABX and used ear drops that he had. Had a tick out of his ear. Pus and blood is coming from the ear. Wants more ABX ear drops  How long have you been sick? Last week  Have you been seen for this problem? Telephone visit with Sharyn Lull  If your provider decides to give you a prescription, which pharmacy would you like for it to be sent to? [facility]   Patient informed that this information will be sent to the clinical staff for review and that they should receive a follow up call.

## 2018-07-15 ENCOUNTER — Telehealth: Payer: Self-pay | Admitting: Family Medicine

## 2018-07-15 NOTE — Telephone Encounter (Signed)
The full course of the antibiotic needed to be completed. If he is having recurrent pain, he needs to be reevaluated.

## 2018-07-15 NOTE — Telephone Encounter (Signed)
Patient seen Jason Morris 6/19 for right ear pain. Was given augmentin and also ear drops. Patient states that he stopped abx because he was feeling better and still has 3 pills left.  Patient states that his ear felt better x 1 week and then 2 days ago he started having right ear pain again. Please advise

## 2018-07-15 NOTE — Telephone Encounter (Signed)
What symptoms do you have? Pain in right ear. Stopped up. Last time it busted and don't know if he has cyst in it or not. Wants more ABX. Running a low grade fever.  How long have you been sick? 2 days  Have you been seen for this problem? Yes with Monia Pouch  If your provider decides to give you a prescription, which pharmacy would you like for it to be sent to? [facility]   Patient informed that this information will be sent to the clinical staff for review and that they should receive a follow up call.

## 2018-07-15 NOTE — Telephone Encounter (Signed)
Patient states he was not going to make another phone visit - wants ear looked at. Advised patient to go to urgent care since we can not bring him into the office due to his symptoms.

## 2018-07-15 NOTE — Telephone Encounter (Signed)
Attempted to return patient's call- mailbox full

## 2018-07-16 ENCOUNTER — Telehealth: Payer: Self-pay | Admitting: Family Medicine

## 2018-07-17 ENCOUNTER — Encounter: Payer: Self-pay | Admitting: Family Medicine

## 2018-07-17 ENCOUNTER — Ambulatory Visit (INDEPENDENT_AMBULATORY_CARE_PROVIDER_SITE_OTHER): Payer: BC Managed Care – PPO | Admitting: Family Medicine

## 2018-07-17 ENCOUNTER — Telehealth: Payer: Self-pay | Admitting: Family Medicine

## 2018-07-17 ENCOUNTER — Other Ambulatory Visit: Payer: Self-pay

## 2018-07-17 ENCOUNTER — Telehealth: Payer: Self-pay

## 2018-07-17 DIAGNOSIS — H60331 Swimmer's ear, right ear: Secondary | ICD-10-CM

## 2018-07-17 DIAGNOSIS — L03811 Cellulitis of head [any part, except face]: Secondary | ICD-10-CM | POA: Diagnosis not present

## 2018-07-17 MED ORDER — ROSUVASTATIN CALCIUM 20 MG PO TABS: 20.0000 mg | ORAL_TABLET | Freq: Every day | ORAL | 1 refills | Status: DC

## 2018-07-17 MED ORDER — DOXYCYCLINE HYCLATE 100 MG PO TABS: 100.0000 mg | ORAL_TABLET | Freq: Two times a day (BID) | ORAL | 0 refills | Status: DC

## 2018-07-17 MED ORDER — CIPRO HC 0.2-1 % OT SUSP: 3.0000 [drp] | Freq: Two times a day (BID) | OTIC | 0 refills | Status: AC

## 2018-07-17 MED ORDER — CIPRODEX 0.3-0.1 % OT SUSP: 4.0000 [drp] | Freq: Two times a day (BID) | OTIC | 0 refills | Status: AC

## 2018-07-17 MED ORDER — PREDNISONE 20 MG PO TABS: ORAL_TABLET | ORAL | 0 refills | Status: DC

## 2018-07-17 NOTE — Telephone Encounter (Signed)
Appt made.  Patient aware  

## 2018-07-17 NOTE — Addendum Note (Signed)
Addended by: Caryl Pina on: 07/17/2018 02:40 PM   Modules accepted: Orders

## 2018-07-17 NOTE — Telephone Encounter (Signed)
Patient aware.

## 2018-07-17 NOTE — Progress Notes (Signed)
Virtual Visit via telephone Note  I connected with Jason Morris on 07/17/18 at 1140 by telephone and verified that I am speaking with the correct person using two identifiers. Jason Morris is currently located at work and no other people are currently with her during visit. The provider, Fransisca Kaufmann Intisar Claudio, MD is located in their office at time of visit.  Call ended at 1152  I discussed the limitations, risks, security and privacy concerns of performing an evaluation and management service by telephone and the availability of in person appointments. I also discussed with the patient that there may be a patient responsible charge related to this service. The patient expressed understanding and agreed to proceed.   History and Present Illness: Patient is calling in complaining of an ear infection in the right ear and he is having pain in the ear canal and having significant pain in that ear.  He denies any fevers or chills or shortness of breath or wheezing. He is using drops and antibiotic for 2 days and feels like the drops are not getting in to the ear. He can't wear his headset on that ear. Denies any sinus pressure or congestion and he has pain in that right jaw as well. He denies any dental problems.   No diagnosis found.  Outpatient Encounter Medications as of 07/17/2018  Medication Sig  . clopidogrel (PLAVIX) 75 MG tablet Take 1 tablet (75 mg total) by mouth daily.  . Coenzyme Q10 (COQ10) 100 MG CAPS Take 1 tablet by mouth daily.  Marland Kitchen L-Arginine 1000 MG TABS Take 1 tablet by mouth daily.  Marland Kitchen lisinopril (PRINIVIL,ZESTRIL) 40 MG tablet Take 0.5 tablets (20 mg total) by mouth 2 (two) times daily.  . metFORMIN (GLUCOPHAGE) 500 MG tablet Take 1 tablet (500 mg total) by mouth 2 (two) times daily with a meal.  . Multiple Vitamin (MULTIVITAMIN) tablet Take 1 tablet by mouth daily.  . rosuvastatin (CRESTOR) 20 MG tablet Take 1 tablet (20 mg total) by mouth daily.  . [DISCONTINUED] atorvastatin  (LIPITOR) 80 MG tablet Take 1 tablet (80 mg total) by mouth daily at 6 PM.   No facility-administered encounter medications on file as of 07/17/2018.     Review of Systems  Constitutional: Negative for chills and fever.  HENT: Positive for ear discharge, ear pain and facial swelling.   Eyes: Negative for visual disturbance.  Respiratory: Negative for cough, shortness of breath and wheezing.   Cardiovascular: Negative for chest pain and leg swelling.  Musculoskeletal: Negative for back pain and gait problem.  Skin: Negative for rash.  Neurological: Negative for dizziness, weakness and light-headedness.  All other systems reviewed and are negative.   Observations/Objective: Patient sounds comfortable and in no acute distress  Assessment and Plan: Problem List Items Addressed This Visit    None    Visit Diagnoses    Cellulitis of head except face    -  Primary   Relevant Medications   doxycycline (VIBRA-TABS) 100 MG tablet   ciprofloxacin-dexamethasone (CIPRODEX) OTIC suspension   predniSONE (DELTASONE) 20 MG tablet   Acute swimmer's ear of right side       Relevant Medications   doxycycline (VIBRA-TABS) 100 MG tablet   ciprofloxacin-dexamethasone (CIPRODEX) OTIC suspension   predniSONE (DELTASONE) 20 MG tablet       Follow Up Instructions: Follow-up as needed or if worsens or does not improve If does not improve then we may need to go see ENT    I discussed  the assessment and treatment plan with the patient. The patient was provided an opportunity to ask questions and all were answered. The patient agreed with the plan and demonstrated an understanding of the instructions.   The patient was advised to call back or seek an in-person evaluation if the symptoms worsen or if the condition fails to improve as anticipated.  The above assessment and management plan was discussed with the patient. The patient verbalized understanding of and has agreed to the management plan.  Patient is aware to call the clinic if symptoms persist or worsen. Patient is aware when to return to the clinic for a follow-up visit. Patient educated on when it is appropriate to go to the emergency department.    I provided 12 minutes of non-face-to-face time during this encounter.    Nils PyleJoshua A Maximiliano Cromartie, MD

## 2018-07-17 NOTE — Telephone Encounter (Signed)
Schedule him for a video visit at 1125 if he can MyChart video

## 2018-07-17 NOTE — Telephone Encounter (Signed)
Patient states that he has an ear infection.  Patient states that he antibiotic ear drops but he can not get the ear drops in due to swelling.  Ear painful to touch and no drainage

## 2018-07-17 NOTE — Telephone Encounter (Signed)
[facility] called states that Ciprodex is not covered by insurance. Did you want to do prior auth or send in something else?   Cipro drops Floxin drops Cortisporin drops   Please advise and send one of the alternates or advise on PA.

## 2018-07-17 NOTE — Telephone Encounter (Signed)
I sent Cipro instead for the patient

## 2018-07-18 ENCOUNTER — Telehealth: Payer: Self-pay | Admitting: Family Medicine

## 2018-07-18 MED ORDER — NEOMYCIN-POLYMYXIN-HC 3.5-10000-1 OT SOLN: 3.0000 [drp] | Freq: Four times a day (QID) | OTIC | 0 refills | Status: DC

## 2018-07-18 NOTE — Telephone Encounter (Signed)
Patient aware and verbalizes understanding. 

## 2018-07-18 NOTE — Telephone Encounter (Signed)
Patient is getting the Ciprodex drops and paying cash for them.

## 2018-07-18 NOTE — Telephone Encounter (Signed)
Please let the patient know that I sent Cortisporin drops which I know at the same as what he had before but at least to have some anti-inflammatory and the other medications that I sent should help as well.

## 2018-12-08 ENCOUNTER — Ambulatory Visit (INDEPENDENT_AMBULATORY_CARE_PROVIDER_SITE_OTHER): Payer: Self-pay | Admitting: Family Medicine

## 2018-12-08 ENCOUNTER — Encounter: Payer: Self-pay | Admitting: Family Medicine

## 2018-12-08 ENCOUNTER — Other Ambulatory Visit: Payer: Self-pay

## 2018-12-08 VITALS — BP 132/86 | HR 82 | Temp 96.4°F | Ht 71.5 in | Wt 277.0 lb

## 2018-12-08 DIAGNOSIS — R300 Dysuria: Secondary | ICD-10-CM

## 2018-12-08 DIAGNOSIS — Z024 Encounter for examination for driving license: Secondary | ICD-10-CM

## 2018-12-08 LAB — URINALYSIS
Bilirubin, UA: NEGATIVE
Glucose, UA: NEGATIVE
Ketones, UA: NEGATIVE
Leukocytes,UA: NEGATIVE
Nitrite, UA: NEGATIVE
Protein,UA: NEGATIVE
Specific Gravity, UA: >1.03 — ABNORMAL HIGH (ref 1.005–1.030)
Urobilinogen, Ur: 0.2 mg/dL (ref 0.2–1.0)
pH, UA: 5.5 (ref 5.0–7.5)

## 2018-12-08 MED ORDER — VARENICLINE TARTRATE 1 MG PO TABS: 1.0000 mg | ORAL_TABLET | Freq: Two times a day (BID) | ORAL | 5 refills | Status: DC

## 2018-12-08 MED ORDER — CHANTIX STARTING MONTH PAK 0.5 MG X 11 & 1 MG X 42 PO TABS: ORAL_TABLET | ORAL | 0 refills | Status: DC

## 2018-12-08 NOTE — Progress Notes (Signed)
Subjective:  Patient ID: Jason Morris, male    DOB: [birth year]  Age: 43 y.o. MRN: [id number]  CC: Annual Exam (DOT)   HPI Jason Morris presents for  Dot exam. Wants to quit smoking. Has hx of HTN  Depression screen [facility] 2/9 12/08/2018 01/27/2018 08/30/2017  Decreased Interest 0 0 2  Down, Depressed, Hopeless 0 0 0  PHQ - 2 Score 0 0 2  Altered sleeping - - 2  Tired, decreased energy - - 3  Change in appetite - - 3  Feeling bad or failure about yourself  - - 0  Trouble concentrating - - 0  Moving slowly or fidgety/restless - - 2  Suicidal thoughts - - -  PHQ-9 Score - - 12    History Jason Morris has a past medical history of Anxiety, CAD (coronary artery disease), Chronic lower back pain, Depression, GERD (gastroesophageal reflux disease), HLD (hyperlipidemia), Hypertension, Migraines, and Tobacco abuse.   He has a past surgical history that includes Myringotomy with tube placement (Bilateral) and left heart cath (N/A, 02/21/2014).   His family history includes Heart attack (age of onset: 6) in his father, paternal grandfather, and paternal uncle.He reports that he has been smoking cigarettes. He has a 17.00 pack-year smoking history. He has never used smokeless tobacco. He reports that he does not drink alcohol or use drugs.    ROS Review of Systems  Constitutional: Negative.   HENT: Negative.   Eyes: Negative for visual disturbance.  Respiratory: Negative for cough and shortness of breath.   Cardiovascular: Negative for chest pain and leg swelling.  Gastrointestinal: Negative for abdominal pain, diarrhea, nausea and vomiting.  Genitourinary: Negative for difficulty urinating.  Musculoskeletal: Negative for arthralgias and myalgias.  Skin: Negative for rash.  Neurological: Negative for headaches.  Psychiatric/Behavioral: Negative for sleep disturbance.    Objective:  BP 132/86   Pulse 82   Temp (!) 96.4 F (35.8 C) (Temporal)   Ht 5' 11.5" (1.816 m)   Wt 277 lb (125.6  kg)   SpO2 98%   BMI 38.10 kg/m   BP Readings from Last 3 Encounters:  12/08/18 132/86  01/27/18 138/82  08/30/17 118/76    Wt Readings from Last 3 Encounters:  12/08/18 277 lb (125.6 kg)  01/27/18 287 lb 12.8 oz (130.5 kg)  08/30/17 273 lb (123.8 kg)     Physical Exam Constitutional:      General: He is not in acute distress.    Appearance: He is well-developed.  HENT:     Head: Normocephalic and atraumatic.     Right Ear: External ear normal.     Left Ear: External ear normal.     Nose: Nose normal.  Eyes:     Conjunctiva/sclera: Conjunctivae normal.     Pupils: Pupils are equal, round, and reactive to light.  Neck:     Musculoskeletal: Normal range of motion and neck supple.  Cardiovascular:     Rate and Rhythm: Normal rate and regular rhythm.     Heart sounds: Normal heart sounds. No murmur.  Pulmonary:     Effort: Pulmonary effort is normal. No respiratory distress.     Breath sounds: Normal breath sounds. No wheezing or rales.  Abdominal:     Palpations: Abdomen is soft.     Tenderness: There is no abdominal tenderness.  Musculoskeletal: Normal range of motion.  Skin:    General: Skin is warm and dry.  Neurological:     Mental Status: He is alert  and oriented to person, place, and time.     Deep Tendon Reflexes: Reflexes are normal and symmetric.  Psychiatric:        Behavior: Behavior normal.        Thought Content: Thought content normal.        Judgment: Judgment normal.       Assessment & Plan:   Jason Morris was seen today for annual exam.  Diagnoses and all orders for this visit:  Dysuria -     Cancel: POCT urinalysis dipstick -     Urinalysis- dip only       I have discontinued Jason Morris's metFORMIN, doxycycline, predniSONE, and neomycin-polymyxin-hydrocortisone. I am also having him maintain his multivitamin, CoQ10, lisinopril, clopidogrel, L-Arginine, and rosuvastatin.  Allergies as of 12/08/2018      Reactions   Lipitor  [atorvastatin] Hives   Lopressor [metoprolol Tartrate] Shortness Of Breath   Felt like he was going to die , could not breath & syncope   Eggs Or Egg-derived Products Other (See Comments)   Really bad stomach pain   Lactose Intolerance (gi) Diarrhea      Medication List       Accurate as of December 08, 2018 12:00 PM. If you have any questions, ask your nurse or doctor.        STOP taking these medications   doxycycline 100 MG tablet Commonly known as: VIBRA-TABS Stopped by: Jason Fraise, MD   metFORMIN 500 MG tablet Commonly known as: GLUCOPHAGE Stopped by: Jason Fraise, MD   neomycin-polymyxin-hydrocortisone OTIC solution Commonly known as: CORTISPORIN Stopped by: Jason Fraise, MD   predniSONE 20 MG tablet Commonly known as: DELTASONE Stopped by: Jason Fraise, MD     TAKE these medications   clopidogrel 75 MG tablet Commonly known as: PLAVIX Take 1 tablet (75 mg total) by mouth daily.   CoQ10 100 MG Caps Take 1 tablet by mouth daily.   L-Arginine 1000 MG Tabs Take 1 tablet by mouth daily.   lisinopril 40 MG tablet Commonly known as: ZESTRIL Take 0.5 tablets (20 mg total) by mouth 2 (two) times daily.   multivitamin tablet Take 1 tablet by mouth daily.   rosuvastatin 20 MG tablet Commonly known as: CRESTOR Take 1 tablet (20 mg total) by mouth daily.        Follow-up: No follow-ups on file.  Jason Morris, M.D.

## 2019-02-18 ENCOUNTER — Other Ambulatory Visit: Payer: Self-pay | Admitting: Family Medicine

## 2019-02-18 DIAGNOSIS — I10 Essential (primary) hypertension: Secondary | ICD-10-CM

## 2019-02-20 ENCOUNTER — Other Ambulatory Visit: Payer: Self-pay | Admitting: Family Medicine

## 2019-02-20 ENCOUNTER — Other Ambulatory Visit: Payer: Self-pay | Admitting: *Deleted

## 2019-02-20 DIAGNOSIS — I10 Essential (primary) hypertension: Secondary | ICD-10-CM

## 2019-02-20 MED ORDER — CLOPIDOGREL BISULFATE 75 MG PO TABS: 75.0000 mg | ORAL_TABLET | Freq: Every day | ORAL | 0 refills | Status: DC

## 2019-02-20 MED ORDER — LISINOPRIL 40 MG PO TABS: 20.0000 mg | ORAL_TABLET | Freq: Two times a day (BID) | ORAL | 0 refills | Status: DC

## 2019-02-20 MED ORDER — ROSUVASTATIN CALCIUM 20 MG PO TABS: 20.0000 mg | ORAL_TABLET | Freq: Every day | ORAL | 0 refills | Status: DC

## 2019-05-25 ENCOUNTER — Other Ambulatory Visit: Payer: Self-pay | Admitting: Family Medicine

## 2019-05-25 ENCOUNTER — Telehealth: Payer: Self-pay | Admitting: Family Medicine

## 2019-05-25 ENCOUNTER — Telehealth: Payer: Self-pay | Admitting: Cardiology

## 2019-05-25 DIAGNOSIS — I10 Essential (primary) hypertension: Secondary | ICD-10-CM

## 2019-05-25 NOTE — Telephone Encounter (Signed)
  Prescription Request  05/25/2019  What is the name of the medication or equipment? clopidogrel (PLAVIX) 75 MG tablet lisinopril (ZESTRIL) 40 MG tablet rosuvastatin (CRESTOR) 20 MG tablet    Have you contacted your pharmacy to request a refill? (if applicable) Yes  Which pharmacy would you like this sent to? [facility], he is out of plavix    Patient notified that their request is being sent to the clinical staff for review and that they should receive a response within 2 business days.

## 2019-05-25 NOTE — Telephone Encounter (Signed)
Last in office visit and lab work was January 2020. No appointments available with pcp for awhile. Please advise.

## 2019-05-25 NOTE — Telephone Encounter (Signed)
Would have his primary care fill as we have not seen in over 3 years; I can refill after next appointment if he follows up with [place] in the future. Olga Millers

## 2019-05-25 NOTE — Telephone Encounter (Signed)
The patient has been advised that we do not carry samples of Plavix. He has asked if Dr. Jens Som would refill the medication for him. The patient had seen Dr. Jens Som in 2017 and now has a new patient appointment in July to reestablish.   He stated that Dr. Darlyn Read has normally been filling it for him but he has been requiring appointments every few months and the patient cannot afford that because insurance will only pay for so many appointments.   He has been advised that he may need to have it refilled by the same provider until he is seen by Dr. Jens Som.

## 2019-05-25 NOTE — Telephone Encounter (Signed)
Patient calling the office for samples of medication:   1.  What medication and dosage are you requesting samples for? clopidogrel (PLAVIX) 75 MG tablet  2.  Are you currently out of this medication? Yes

## 2019-05-25 NOTE — Telephone Encounter (Signed)
Offered to schedule an appointment  but he says his cardiologist will fill medication.  He will call back if he needs to later.

## 2019-05-25 NOTE — Telephone Encounter (Signed)
Go ahead and give him enough refills of Plavix and lisinopril and Crestor to get through to the appointment, scheduled the appointment ASAP.

## 2019-05-26 ENCOUNTER — Telehealth: Payer: Self-pay | Admitting: Cardiology

## 2019-05-26 MED ORDER — CLOPIDOGREL BISULFATE 75 MG PO TABS: 75.0000 mg | ORAL_TABLET | Freq: Every day | ORAL | 0 refills | Status: DC

## 2019-05-26 MED ORDER — LISINOPRIL 40 MG PO TABS: 20.0000 mg | ORAL_TABLET | Freq: Two times a day (BID) | ORAL | 0 refills | Status: DC

## 2019-05-26 MED ORDER — ROSUVASTATIN CALCIUM 20 MG PO TABS: 20.0000 mg | ORAL_TABLET | Freq: Every day | ORAL | 0 refills | Status: DC

## 2019-05-26 NOTE — Telephone Encounter (Signed)
Unable to reach pt or leave a message mailbox is full 

## 2019-05-26 NOTE — Telephone Encounter (Signed)
Spoke with pt, Aware of dr Ludwig Clarks recommendations. He reports his medical doctor will no longer fill it for him. Okay for refills to appointment per crenshaw. Refill sent to the pharmacy electronically.

## 2019-05-28 ENCOUNTER — Other Ambulatory Visit: Payer: Self-pay

## 2019-05-28 ENCOUNTER — Other Ambulatory Visit: Payer: Self-pay | Admitting: Family Medicine

## 2019-05-28 DIAGNOSIS — E7800 Pure hypercholesterolemia, unspecified: Secondary | ICD-10-CM

## 2019-05-28 DIAGNOSIS — R7303 Prediabetes: Secondary | ICD-10-CM

## 2019-05-28 DIAGNOSIS — E785 Hyperlipidemia, unspecified: Secondary | ICD-10-CM

## 2019-05-28 DIAGNOSIS — K219 Gastro-esophageal reflux disease without esophagitis: Secondary | ICD-10-CM

## 2019-05-28 LAB — BAYER DCA HB A1C WAIVED: HB A1C (BAYER DCA - WAIVED): 5.9 % (ref <7.0)

## 2019-05-28 NOTE — Progress Notes (Signed)
Placed orders for the patient to come do lab draw

## 2019-05-29 LAB — CBC WITH DIFFERENTIAL/PLATELET
Basophils Absolute: 0 10*3/uL (ref 0.0–0.2)
Basos: 0 %
EOS (ABSOLUTE): 0.1 10*3/uL (ref 0.0–0.4)
Eos: 2 %
Hematocrit: 47.8 % (ref 37.5–51.0)
Hemoglobin: 15.7 g/dL (ref 13.0–17.7)
Immature Grans (Abs): 0 10*3/uL (ref 0.0–0.1)
Immature Granulocytes: 1 %
Lymphocytes Absolute: 3.1 10*3/uL (ref 0.7–3.1)
Lymphs: 41 %
MCH: 29.8 pg (ref 26.6–33.0)
MCHC: 32.8 g/dL (ref 31.5–35.7)
MCV: 91 fL (ref 79–97)
Monocytes Absolute: 0.8 10*3/uL (ref 0.1–0.9)
Monocytes: 10 %
Neutrophils Absolute: 3.6 10*3/uL (ref 1.4–7.0)
Neutrophils: 46 %
Platelets: 189 10*3/uL (ref 150–450)
RBC: 5.27 x10E6/uL (ref 4.14–5.80)
RDW: 11.8 % (ref 11.6–15.4)
WBC: 7.7 10*3/uL (ref 3.4–10.8)

## 2019-05-29 LAB — CMP14+EGFR
ALT: 40 IU/L (ref 0–44)
AST: 25 IU/L (ref 0–40)
Albumin/Globulin Ratio: 1.8 (ref 1.2–2.2)
Albumin: 4.4 g/dL (ref 4.0–5.0)
Alkaline Phosphatase: 61 IU/L (ref 48–121)
BUN/Creatinine Ratio: 15 (ref 9–20)
BUN: 14 mg/dL (ref 6–24)
Bilirubin Total: <0.2 mg/dL (ref 0.0–1.2)
CO2: 21 mmol/L (ref 20–29)
Calcium: 9.5 mg/dL (ref 8.7–10.2)
Chloride: 105 mmol/L (ref 96–106)
Creatinine, Ser: 0.96 mg/dL (ref 0.76–1.27)
GFR calc Af Amer: 112 mL/min/{1.73_m2} (ref >59)
GFR calc non Af Amer: 97 mL/min/{1.73_m2} (ref >59)
Globulin, Total: 2.5 g/dL (ref 1.5–4.5)
Glucose: 86 mg/dL (ref 65–99)
Potassium: 5.2 mmol/L (ref 3.5–5.2)
Sodium: 139 mmol/L (ref 134–144)
Total Protein: 6.9 g/dL (ref 6.0–8.5)

## 2019-05-29 LAB — LIPID PANEL
Chol/HDL Ratio: 7.5 ratio — ABNORMAL HIGH (ref 0.0–5.0)
Cholesterol, Total: 194 mg/dL (ref 100–199)
HDL: 26 mg/dL — ABNORMAL LOW (ref >39)
LDL Chol Calc (NIH): 131 mg/dL — ABNORMAL HIGH (ref 0–99)
Triglycerides: 206 mg/dL — ABNORMAL HIGH (ref 0–149)
VLDL Cholesterol Cal: 37 mg/dL (ref 5–40)

## 2019-06-01 ENCOUNTER — Telehealth (INDEPENDENT_AMBULATORY_CARE_PROVIDER_SITE_OTHER): Payer: Self-pay | Admitting: Family Medicine

## 2019-06-01 ENCOUNTER — Encounter: Payer: Self-pay | Admitting: Family Medicine

## 2019-06-01 DIAGNOSIS — I10 Essential (primary) hypertension: Secondary | ICD-10-CM

## 2019-06-01 DIAGNOSIS — E7800 Pure hypercholesterolemia, unspecified: Secondary | ICD-10-CM

## 2019-06-01 DIAGNOSIS — R7303 Prediabetes: Secondary | ICD-10-CM

## 2019-06-01 DIAGNOSIS — E785 Hyperlipidemia, unspecified: Secondary | ICD-10-CM

## 2019-06-01 DIAGNOSIS — K219 Gastro-esophageal reflux disease without esophagitis: Secondary | ICD-10-CM

## 2019-06-01 MED ORDER — LISINOPRIL 40 MG PO TABS: 20.0000 mg | ORAL_TABLET | Freq: Two times a day (BID) | ORAL | 3 refills | Status: DC

## 2019-06-01 MED ORDER — ROSUVASTATIN CALCIUM 20 MG PO TABS: 20.0000 mg | ORAL_TABLET | Freq: Every day | ORAL | 3 refills | Status: DC

## 2019-06-01 MED ORDER — CLOPIDOGREL BISULFATE 75 MG PO TABS: 75.0000 mg | ORAL_TABLET | Freq: Every day | ORAL | 3 refills | Status: DC

## 2019-06-01 NOTE — Progress Notes (Signed)
Attempted to send video link and waited 10 minutes and then attempted to call the patient's phone, no answer, voicemail box was full Arville Care, MD [facility] 06/01/2019, 8:27 AM

## 2019-06-01 NOTE — Progress Notes (Signed)
Virtual Visit via Mychart Video Note  I connected with Jason Morris on 06/01/19 at 1549 by telephone and verified that I am speaking with the correct person using two identifiers. Jason Morris is currently located at work and no other people are currently with her during visit. The provider, Elige Radon Bryanne Riquelme, MD is located in their office at time of visit.  Call ended at 1610  I discussed the limitations, risks, security and privacy concerns of performing an evaluation and management service by telephone and the availability of in person appointments. I also discussed with the patient that there may be a patient responsible charge related to this service. The patient expressed understanding and agreed to proceed.   History and Present Illness: Prediabetes Patient comes in today for recheck of his diabetes. Patient has been currently taking keto diet and A1c 5.9. Patient is currently on an ACE inhibitor/ARB. Patient has not seen an ophthalmologist this year. Patient denies any issues with their feet. The symptom started onset as an adult htn and hld ARE RELATED TO DM   GERD Patient is currently on pepcid and it does well.  She denies any major symptoms or abdominal pain or belching or burping. She denies any blood in her stool or lightheadedness or dizziness.   Hyperlipidemia Patient is coming in for recheck of his hyperlipidemia. The patient is currently taking crestor. They deny any issues with myalgias or history of liver damage from it. They deny any focal numbness or weakness or chest pain.   Hypertension and CAD Patient is currently on lisinopril and plavix, and their blood pressure today is 120 systolic. Patient denies any lightheadedness or dizziness. Patient denies headaches, blurred vision, chest pains, shortness of breath, or weakness. Denies any side effects from medication and is content with current medication.   No diagnosis found.  Outpatient Encounter Medications as of  06/01/2019  Medication Sig  . clopidogrel (PLAVIX) 75 MG tablet Take 1 tablet (75 mg total) by mouth daily. NEEDS TO BE SEEN FOR FURTHER REFILLS  . Coenzyme Q10 (COQ10) 100 MG CAPS Take 1 tablet by mouth daily.  Marland Kitchen L-Arginine 1000 MG TABS Take 1 tablet by mouth daily.  Marland Kitchen lisinopril (ZESTRIL) 40 MG tablet Take 0.5 tablets (20 mg total) by mouth 2 (two) times daily. NEEDS TO BE SEEN FOR FURTHER REFILLS  . Multiple Vitamin (MULTIVITAMIN) tablet Take 1 tablet by mouth daily.  . rosuvastatin (CRESTOR) 20 MG tablet Take 1 tablet (20 mg total) by mouth daily. NEEDS TO BE SEEN FOR FURTHER REFILLS  . varenicline (CHANTIX CONTINUING MONTH PAK) 1 MG tablet Take 1 tablet (1 mg total) by mouth 2 (two) times daily.  . varenicline (CHANTIX STARTING MONTH PAK) 0.5 MG X 11 & 1 MG X 42 tablet Use according to package directions  . [DISCONTINUED] atorvastatin (LIPITOR) 80 MG tablet Take 1 tablet (80 mg total) by mouth daily at 6 PM.   No facility-administered encounter medications on file as of 06/01/2019.    Review of Systems  Constitutional: Negative for chills and fever.  Eyes: Negative for visual disturbance.  Respiratory: Negative for shortness of breath and wheezing.   Cardiovascular: Negative for chest pain and leg swelling.  Musculoskeletal: Negative for back pain and gait problem.  Skin: Negative for rash.  All other systems reviewed and are negative.   Observations/Objective: Patient sounds comfortable and in no acute distres  Assessment and Plan: Problem List Items Addressed This Visit      Cardiovascular  and Mediastinum   Essential hypertension   Relevant Medications   clopidogrel (PLAVIX) 75 MG tablet   lisinopril (ZESTRIL) 40 MG tablet   rosuvastatin (CRESTOR) 20 MG tablet     Digestive   GERD (gastroesophageal reflux disease)     Other   Dyslipidemia   Relevant Medications   rosuvastatin (CRESTOR) 20 MG tablet   Prediabetes - Primary   Relevant Medications   clopidogrel  (PLAVIX) 75 MG tablet   Hypercholesterolemia   Relevant Medications   clopidogrel (PLAVIX) 75 MG tablet   lisinopril (ZESTRIL) 40 MG tablet   rosuvastatin (CRESTOR) 20 MG tablet      Continue current medication, he is doing keto diet which does seem to be helping him with weight loss and doing better except for his LDL, we discussed this and he will try to avoid the fatty and fried foods for the most part. Follow up plan: Return in about 6 months (around 12/02/2019), or if symptoms worsen or fail to improve, for prediabetes and hld.     I discussed the assessment and treatment plan with the patient. The patient was provided an opportunity to ask questions and all were answered. The patient agreed with the plan and demonstrated an understanding of the instructions.   The patient was advised to call back or seek an in-person evaluation if the symptoms worsen or if the condition fails to improve as anticipated.  The above assessment and management plan was discussed with the patient. The patient verbalized understanding of and has agreed to the management plan. Patient is aware to call the clinic if symptoms persist or worsen. Patient is aware when to return to the clinic for a follow-up visit. Patient educated on when it is appropriate to go to the emergency department.    I provided 21 minutes of non-face-to-face time during this encounter.    Worthy Rancher, MD

## 2019-08-03 ENCOUNTER — Ambulatory Visit: Payer: BLUE CROSS/BLUE SHIELD | Admitting: Cardiology

## 2019-09-24 ENCOUNTER — Encounter: Payer: Self-pay | Admitting: Family Medicine

## 2019-09-24 ENCOUNTER — Ambulatory Visit (INDEPENDENT_AMBULATORY_CARE_PROVIDER_SITE_OTHER): Payer: Self-pay | Admitting: Family Medicine

## 2019-09-24 DIAGNOSIS — H6691 Otitis media, unspecified, right ear: Secondary | ICD-10-CM

## 2019-09-24 MED ORDER — CIPROFLOXACIN-DEXAMETHASONE 0.3-0.1 % OT SUSP
4.0000 [drp] | Freq: Two times a day (BID) | OTIC | 0 refills | Status: DC
Start: 2019-09-24 — End: 2020-06-20

## 2019-09-24 MED ORDER — AMOXICILLIN-POT CLAVULANATE 875-125 MG PO TABS: 1.0000 | ORAL_TABLET | Freq: Two times a day (BID) | ORAL | 0 refills | Status: DC

## 2019-09-24 NOTE — Addendum Note (Signed)
Addended by: Arville Care on: 09/24/2019 07:10 PM   Modules accepted: Orders

## 2019-09-24 NOTE — Progress Notes (Signed)
Virtual Visit via telephone Note  I connected with Jason Morris on 09/24/19 at 1714 by telephone and verified that I am speaking with the correct person using two identifiers. Jason Morris is currently located at home and patient and no other people are currently with her during visit. The provider, Elige Radon Jt Brabec, MD is located in their office at time of visit.  Call ended at 1720  I discussed the limitations, risks, security and privacy concerns of performing an evaluation and management service by telephone and the availability of in person appointments. I also discussed with the patient that there may be a patient responsible charge related to this service. The patient expressed understanding and agreed to proceed.   History and Present Illness: Patient is calling in for an ear infection and pain in his top of his ear. He has a lot of burning and pain in that ear.  He denies any fevers or chills.  He has been using ear drops. He is not having any drainage.  It is getting worse despite the ciprodex.  He denies any fevers or chills.  He denies sick contacts.   No diagnosis found.  Outpatient Encounter Medications as of 09/24/2019  Medication Sig  . clopidogrel (PLAVIX) 75 MG tablet Take 1 tablet (75 mg total) by mouth daily.  . Coenzyme Q10 (COQ10) 100 MG CAPS Take 1 tablet by mouth daily.  Marland Kitchen L-Arginine 1000 MG TABS Take 1 tablet by mouth daily.  Marland Kitchen lisinopril (ZESTRIL) 40 MG tablet Take 0.5 tablets (20 mg total) by mouth 2 (two) times daily.  . Multiple Vitamin (MULTIVITAMIN) tablet Take 1 tablet by mouth daily.  . rosuvastatin (CRESTOR) 20 MG tablet Take 1 tablet (20 mg total) by mouth daily.  . [DISCONTINUED] atorvastatin (LIPITOR) 80 MG tablet Take 1 tablet (80 mg total) by mouth daily at 6 PM.   No facility-administered encounter medications on file as of 09/24/2019.    Review of Systems  Constitutional: Negative for chills and fever.  HENT: Positive for ear pain. Negative  for congestion, ear discharge, facial swelling, rhinorrhea, sinus pressure, sinus pain, sneezing and sore throat.   Eyes: Negative for discharge.  Respiratory: Negative for cough, shortness of breath and wheezing.   Cardiovascular: Negative for chest pain and leg swelling.  Skin: Negative for rash.  All other systems reviewed and are negative.   Observations/Objective: Patient sounds comfortable  Assessment and Plan: Problem List Items Addressed This Visit    None    Visit Diagnoses    Otitis of right ear    -  Primary   Relevant Medications   amoxicillin-clavulanate (AUGMENTIN) 875-125 MG tablet      Will treat symptomatically for otitis  Follow up plan: Return if symptoms worsen or fail to improve.     I discussed the assessment and treatment plan with the patient. The patient was provided an opportunity to ask questions and all were answered. The patient agreed with the plan and demonstrated an understanding of the instructions.   The patient was advised to call back or seek an in-person evaluation if the symptoms worsen or if the condition fails to improve as anticipated.  The above assessment and management plan was discussed with the patient. The patient verbalized understanding of and has agreed to the management plan. Patient is aware to call the clinic if symptoms persist or worsen. Patient is aware when to return to the clinic for a follow-up visit. Patient educated on when it is  appropriate to go to the emergency department.    I provided 6 minutes of non-face-to-face time during this encounter.    Worthy Rancher, MD

## 2019-10-26 ENCOUNTER — Telehealth: Payer: Self-pay

## 2019-10-26 NOTE — Telephone Encounter (Signed)
Offered appointment for tomorrow with Dr Darlyn Read, patient declined due to work. Stated he will try ER today.

## 2019-10-29 ENCOUNTER — Ambulatory Visit: Payer: Self-pay | Admitting: Family Medicine

## 2019-10-29 NOTE — Telephone Encounter (Signed)
Patient had an appointment with the urgent care yesterday and is going for an xray today.  Will call back to schedule with PCP if he does not get better.

## 2020-02-15 ENCOUNTER — Telehealth: Payer: Self-pay

## 2020-02-15 NOTE — Telephone Encounter (Signed)
Spoke with patient, appointment scheduled on 02/16/2020 at 10:15 am

## 2020-02-16 ENCOUNTER — Encounter: Payer: Self-pay | Admitting: Nurse Practitioner

## 2020-02-16 ENCOUNTER — Other Ambulatory Visit: Payer: Self-pay

## 2020-02-16 ENCOUNTER — Ambulatory Visit (INDEPENDENT_AMBULATORY_CARE_PROVIDER_SITE_OTHER): Payer: Self-pay | Admitting: Nurse Practitioner

## 2020-02-16 DIAGNOSIS — Z23 Encounter for immunization: Secondary | ICD-10-CM

## 2020-02-16 DIAGNOSIS — Z024 Encounter for examination for driving license: Secondary | ICD-10-CM

## 2020-02-16 LAB — URINALYSIS
Bilirubin, UA: NEGATIVE
Glucose, UA: NEGATIVE
Ketones, UA: NEGATIVE
Leukocytes,UA: NEGATIVE
Nitrite, UA: NEGATIVE
Protein,UA: NEGATIVE
RBC, UA: NEGATIVE
Specific Gravity, UA: 1.025 (ref 1.005–1.030)
Urobilinogen, Ur: 0.2 mg/dL (ref 0.2–1.0)
pH, UA: 5.5 (ref 5.0–7.5)

## 2020-02-16 NOTE — Addendum Note (Signed)
Addended by: Cleda Daub on: 02/16/2020 10:17 AM   Modules accepted: Orders

## 2020-02-16 NOTE — Progress Notes (Signed)
  Patient ID: Jason Morris, male   DOB: March 21, 1975, 44 y.o.   MRN: [id number]   Private DOT physical- see scanned in documentation.

## 2020-04-01 ENCOUNTER — Encounter: Payer: Self-pay | Admitting: Family Medicine

## 2020-04-01 ENCOUNTER — Ambulatory Visit (INDEPENDENT_AMBULATORY_CARE_PROVIDER_SITE_OTHER): Payer: Self-pay | Admitting: Family Medicine

## 2020-04-01 DIAGNOSIS — J069 Acute upper respiratory infection, unspecified: Secondary | ICD-10-CM

## 2020-04-01 MED ORDER — AMOXICILLIN 875 MG PO TABS: 875.0000 mg | ORAL_TABLET | Freq: Two times a day (BID) | ORAL | 0 refills | Status: AC

## 2020-04-01 MED ORDER — BENZONATATE 100 MG PO CAPS: 100.0000 mg | ORAL_CAPSULE | Freq: Three times a day (TID) | ORAL | 0 refills | Status: DC | PRN

## 2020-04-01 MED ORDER — FLUTICASONE PROPIONATE 50 MCG/ACT NA SUSP: 2.0000 | Freq: Every day | NASAL | 6 refills | Status: DC

## 2020-04-01 NOTE — Progress Notes (Signed)
   Virtual Visit via telephone Note Due to COVID-19 pandemic this visit was conducted virtually. This visit type was conducted due to national recommendations for restrictions regarding the COVID-19 Pandemic (e.g. social distancing, sheltering in place) in an effort to limit this patient's exposure and mitigate transmission in our community. All issues noted in this document were discussed and addressed.  A physical exam was not performed with this format.  I connected with Jason Morris on 04/01/20 at 0825 by telephone and verified that I am speaking with the correct person using two identifiers. Jason Morris is currently located at work and no one is currently with him during the visit. The provider, Gabriel Earing, FNP is located in their office at time of visit.  I discussed the limitations, risks, security and privacy concerns of performing an evaluation and management service by telephone and the availability of in person appointments. I also discussed with the patient that there may be a patient responsible charge related to this service. The patient expressed understanding and agreed to proceed.  CC: congestion, cough  History and Present Illness:  HPI  Jason Morris reports head congestion, dry cough, and ear pain x 4 days. He also reports sore throat and postnasal drip. He denies exudate on his throat. The first night he did have chills and body aches on the first night. Denies nausea or vomiting. He has taken multiple home Covid tests which have been negative. He has been taking mucinex, zyrtec, ibuprofen, and robitussin without much improvement. He reports a history of ear infections requiring antibiotics.     ROS As per HPI.   Observations/Objective: Alert and oriented x 3. Able to speak in full sentences without difficulty.    Assessment and Plan: Jason Morris was seen today for nasal congestion.  Diagnoses and all orders for this visit:  URI, acute Reports multiple negative home  covid tests. Amoxicillin, flonase, and tessalon perles as below. Continue mucinex. Stay well hydrated. Rest.  -     amoxicillin (AMOXIL) 875 MG tablet; Take 1 tablet (875 mg total) by mouth 2 (two) times daily for 7 days. -     fluticasone (FLONASE) 50 MCG/ACT nasal spray; Place 2 sprays into both nostrils daily. -     benzonatate (TESSALON PERLES) 100 MG capsule; Take 1 capsule (100 mg total) by mouth 3 (three) times daily as needed for cough.     Follow Up Instructions: As needed.     I discussed the assessment and treatment plan with the patient. The patient was provided an opportunity to ask questions and all were answered. The patient agreed with the plan and demonstrated an understanding of the instructions.   The patient was advised to call back or seek an in-person evaluation if the symptoms worsen or if the condition fails to improve as anticipated.  The above assessment and management plan was discussed with the patient. The patient verbalized understanding of and has agreed to the management plan. Patient is aware to call the clinic if symptoms persist or worsen. Patient is aware when to return to the clinic for a follow-up visit. Patient educated on when it is appropriate to go to the emergency department.   Time call ended:  0837  I provided 12 minutes of phone time during this encounter.    Gabriel Earing, FNP

## 2020-06-02 ENCOUNTER — Other Ambulatory Visit: Payer: Self-pay | Admitting: Cardiology

## 2020-06-02 DIAGNOSIS — R7303 Prediabetes: Secondary | ICD-10-CM

## 2020-06-02 DIAGNOSIS — E7800 Pure hypercholesterolemia, unspecified: Secondary | ICD-10-CM

## 2020-06-02 DIAGNOSIS — I10 Essential (primary) hypertension: Secondary | ICD-10-CM

## 2020-06-16 ENCOUNTER — Other Ambulatory Visit: Payer: Self-pay | Admitting: Family Medicine

## 2020-06-16 DIAGNOSIS — I10 Essential (primary) hypertension: Secondary | ICD-10-CM

## 2020-06-16 DIAGNOSIS — E7800 Pure hypercholesterolemia, unspecified: Secondary | ICD-10-CM

## 2020-06-16 DIAGNOSIS — R7303 Prediabetes: Secondary | ICD-10-CM

## 2020-06-16 MED ORDER — ROSUVASTATIN CALCIUM 20 MG PO TABS: 20.0000 mg | ORAL_TABLET | Freq: Every day | ORAL | 3 refills | Status: DC

## 2020-06-16 MED ORDER — CLOPIDOGREL BISULFATE 75 MG PO TABS: 75.0000 mg | ORAL_TABLET | Freq: Every day | ORAL | 0 refills | Status: DC
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
## 2020-06-18 ENCOUNTER — Other Ambulatory Visit: Payer: Self-pay | Admitting: Nurse Practitioner

## 2020-06-18 DIAGNOSIS — I10 Essential (primary) hypertension: Secondary | ICD-10-CM

## 2020-06-18 MED ORDER — LISINOPRIL 40 MG PO TABS: 20.0000 mg | ORAL_TABLET | Freq: Two times a day (BID) | ORAL | 3 refills | Status: DC

## 2020-06-18 NOTE — Progress Notes (Signed)
On call service for refill

## 2020-06-20 ENCOUNTER — Ambulatory Visit (INDEPENDENT_AMBULATORY_CARE_PROVIDER_SITE_OTHER): Payer: Self-pay | Admitting: Family Medicine

## 2020-06-20 ENCOUNTER — Other Ambulatory Visit: Payer: Self-pay | Admitting: Family Medicine

## 2020-06-20 ENCOUNTER — Other Ambulatory Visit: Payer: Self-pay

## 2020-06-20 ENCOUNTER — Encounter: Payer: Self-pay | Admitting: Family Medicine

## 2020-06-20 DIAGNOSIS — Z23 Encounter for immunization: Secondary | ICD-10-CM

## 2020-06-20 DIAGNOSIS — E785 Hyperlipidemia, unspecified: Secondary | ICD-10-CM

## 2020-06-20 DIAGNOSIS — R7303 Prediabetes: Secondary | ICD-10-CM

## 2020-06-20 DIAGNOSIS — I10 Essential (primary) hypertension: Secondary | ICD-10-CM

## 2020-06-20 DIAGNOSIS — E7800 Pure hypercholesterolemia, unspecified: Secondary | ICD-10-CM

## 2020-06-20 LAB — BAYER DCA HB A1C WAIVED: HB A1C (BAYER DCA - WAIVED): 6 % (ref <7.0)

## 2020-06-20 MED ORDER — CLOPIDOGREL BISULFATE 75 MG PO TABS: 75.0000 mg | ORAL_TABLET | Freq: Every day | ORAL | 3 refills | Status: DC

## 2020-06-20 NOTE — Progress Notes (Signed)
BP (!) 141/89   Pulse 73   Ht 5' 11.5" (1.816 m)   Wt 280 lb (127 kg)   SpO2 91%   BMI 38.51 kg/m    Subjective:   Patient ID: Jason Morris, male    DOB: [birth year], 45 y.o.   MRN: [id number]  HPI: Jason Morris is a 45 y.o. male presenting on 06/20/2020 for Medical Management of Chronic Issues and Hypertension   HPI Hypertension Patient is currently on lisinopril, and their blood pressure today is 141/89. Patient denies any lightheadedness or dizziness. Patient denies headaches, blurred vision, chest pains, shortness of breath, or weakness. Denies any side effects from medication and is content with current medication.   Hyperlipidemia and dyslipidemia Patient is coming in for recheck of his hyperlipidemia. The patient is currently taking crestor. They deny any issues with myalgias or history of liver damage from it. They deny any focal numbness or weakness or chest pain.   Prediabetes Patient comes in today for recheck of his diabetes. Patient has been currently taking no medication currently, check blood sugar today. Patient is currently on an ACE inhibitor/ARB. Patient has not seen an ophthalmologist this year. Patient denies any issues with their feet. The symptom started onset as an adult hyperlipidemia and hypertension ARE RELATED TO DM   Relevant past medical, surgical, family and social history reviewed and updated as indicated. Interim medical history since our last visit reviewed. Allergies and medications reviewed and updated.  Review of Systems  Constitutional:  Negative for chills and fever.  Eyes:  Negative for visual disturbance.  Respiratory:  Negative for shortness of breath and wheezing.   Cardiovascular:  Negative for chest pain and leg swelling.  Musculoskeletal:  Negative for back pain and gait problem.  Skin:  Negative for rash.  Neurological:  Negative for dizziness, weakness and light-headedness.  All other systems reviewed and are negative.  Per HPI  unless specifically indicated above   Allergies as of 06/20/2020       Reactions   Lipitor [atorvastatin] Hives   Lopressor [metoprolol Tartrate] Shortness Of Breath   Felt like he was going to die , could not breath & syncope   Eggs Or Egg-derived Products Other (See Comments)   Really bad stomach pain   Lactose Intolerance (gi) Diarrhea        Medication List        Accurate as of June 20, 2020 11:59 PM. If you have any questions, ask your nurse or doctor.          STOP taking these medications    benzonatate 100 MG capsule Commonly known as: Best boy Stopped by: Worthy Rancher, MD   ciprofloxacin-dexamethasone OTIC suspension Commonly known as: Ciprodex Stopped by: Fransisca Kaufmann Angeleen Horney, MD   fluticasone 50 MCG/ACT nasal spray Commonly known as: FLONASE Stopped by: Worthy Rancher, MD       TAKE these medications    clopidogrel 75 MG tablet Commonly known as: PLAVIX Take 1 tablet (75 mg total) by mouth daily.   CoQ10 100 MG Caps Take 1 tablet by mouth daily.   L-Arginine 1000 MG Tabs Take 1 tablet by mouth daily.   lisinopril 40 MG tablet Commonly known as: ZESTRIL Take 0.5 tablets (20 mg total) by mouth 2 (two) times daily.   multivitamin tablet Take 1 tablet by mouth daily.   rosuvastatin 20 MG tablet Commonly known as: CRESTOR Take 1 tablet (20 mg total) by mouth daily.  Objective:   BP (!) 141/89   Pulse 73   Ht 5' 11.5" (1.816 m)   Wt 280 lb (127 kg)   SpO2 91%   BMI 38.51 kg/m   Wt Readings from Last 3 Encounters:  06/20/20 280 lb (127 kg)  12/08/18 277 lb (125.6 kg)  01/27/18 287 lb 12.8 oz (130.5 kg)    Physical Exam Vitals and nursing note reviewed.  Constitutional:      General: He is not in acute distress.    Appearance: He is well-developed. He is not diaphoretic.  Eyes:     General: No scleral icterus.    Conjunctiva/sclera: Conjunctivae normal.  Neck:     Thyroid: No thyromegaly.   Cardiovascular:     Rate and Rhythm: Normal rate and regular rhythm.     Heart sounds: Normal heart sounds. No murmur heard. Pulmonary:     Effort: Pulmonary effort is normal. No respiratory distress.     Breath sounds: Normal breath sounds. No wheezing.  Musculoskeletal:     Cervical back: Neck supple.  Lymphadenopathy:     Cervical: No cervical adenopathy.  Skin:    General: Skin is warm and dry.     Findings: No rash.  Neurological:     Mental Status: He is alert and oriented to person, place, and time.     Coordination: Coordination normal.  Psychiatric:        Behavior: Behavior normal.      Assessment & Plan:   Problem List Items Addressed This Visit       Cardiovascular and Mediastinum   Essential hypertension   Relevant Medications   clopidogrel (PLAVIX) 75 MG tablet   Other Relevant Orders   CBC with Differential/Platelet (Completed)   CMP14+EGFR (Completed)     Other   Dyslipidemia   Relevant Orders   Lipid panel (Completed)   Prediabetes   Relevant Medications   clopidogrel (PLAVIX) 75 MG tablet   Other Relevant Orders   CBC with Differential/Platelet (Completed)   Bayer DCA Hb A1c Waived (Completed)   Hypercholesterolemia   Relevant Medications   clopidogrel (PLAVIX) 75 MG tablet   Other Relevant Orders   Lipid panel (Completed)   Other Visit Diagnoses     Need for Tdap vaccination       Relevant Orders   Tdap vaccine greater than or equal to 7yo IM (Completed)       No change in medication, patient has been out of his meds for a little bit and that is why His blood pressure is up slightly, restart medications at the same dose. Follow up plan: Return if symptoms worsen or fail to improve, for 6 months to a year recheck.  Counseling provided for all of the vaccine components Orders Placed This Encounter  Procedures   Tdap vaccine greater than or equal to 7yo IM   CBC with Differential/Platelet   CMP14+EGFR   Lipid panel   Bayer DCA Hb  A1c Waived    Caryl Pina, MD [facility] 06/22/2020, 7:47 AM

## 2020-06-21 LAB — CBC WITH DIFFERENTIAL/PLATELET
Basophils Absolute: 0 10*3/uL (ref 0.0–0.2)
Basos: 0 %
EOS (ABSOLUTE): 0.2 10*3/uL (ref 0.0–0.4)
Eos: 2 %
Hematocrit: 45.6 % (ref 37.5–51.0)
Hemoglobin: 15.6 g/dL (ref 13.0–17.7)
Immature Grans (Abs): 0.1 10*3/uL (ref 0.0–0.1)
Immature Granulocytes: 1 %
Lymphocytes Absolute: 2.8 10*3/uL (ref 0.7–3.1)
Lymphs: 42 %
MCH: 30.1 pg (ref 26.6–33.0)
MCHC: 34.2 g/dL (ref 31.5–35.7)
MCV: 88 fL (ref 79–97)
Monocytes Absolute: 0.6 10*3/uL (ref 0.1–0.9)
Monocytes: 9 %
Neutrophils Absolute: 3 10*3/uL (ref 1.4–7.0)
Neutrophils: 46 %
Platelets: 200 10*3/uL (ref 150–450)
RBC: 5.18 x10E6/uL (ref 4.14–5.80)
RDW: 11.8 % (ref 11.6–15.4)
WBC: 6.7 10*3/uL (ref 3.4–10.8)

## 2020-06-21 LAB — CMP14+EGFR
ALT: 31 IU/L (ref 0–44)
AST: 22 IU/L (ref 0–40)
Albumin/Globulin Ratio: 2 (ref 1.2–2.2)
Albumin: 4.6 g/dL (ref 4.0–5.0)
Alkaline Phosphatase: 60 IU/L (ref 44–121)
BUN/Creatinine Ratio: 9 (ref 9–20)
BUN: 8 mg/dL (ref 6–24)
Bilirubin Total: 0.3 mg/dL (ref 0.0–1.2)
CO2: 19 mmol/L — ABNORMAL LOW (ref 20–29)
Calcium: 9.5 mg/dL (ref 8.7–10.2)
Chloride: 104 mmol/L (ref 96–106)
Creatinine, Ser: 0.87 mg/dL (ref 0.76–1.27)
Globulin, Total: 2.3 g/dL (ref 1.5–4.5)
Glucose: 91 mg/dL (ref 65–99)
Potassium: 4.5 mmol/L (ref 3.5–5.2)
Sodium: 139 mmol/L (ref 134–144)
Total Protein: 6.9 g/dL (ref 6.0–8.5)
eGFR: 110 mL/min/{1.73_m2} (ref >59)

## 2020-06-21 LAB — LIPID PANEL
Chol/HDL Ratio: 6.4 ratio — ABNORMAL HIGH (ref 0.0–5.0)
Cholesterol, Total: 193 mg/dL (ref 100–199)
HDL: 30 mg/dL — ABNORMAL LOW (ref >39)
LDL Chol Calc (NIH): 137 mg/dL — ABNORMAL HIGH (ref 0–99)
Triglycerides: 144 mg/dL (ref 0–149)
VLDL Cholesterol Cal: 26 mg/dL (ref 5–40)

## 2020-07-12 ENCOUNTER — Encounter: Payer: Self-pay | Admitting: Nurse Practitioner

## 2020-07-12 ENCOUNTER — Other Ambulatory Visit: Payer: Self-pay

## 2020-07-12 ENCOUNTER — Ambulatory Visit (INDEPENDENT_AMBULATORY_CARE_PROVIDER_SITE_OTHER): Payer: Self-pay | Admitting: Nurse Practitioner

## 2020-07-12 VITALS — BP 132/75 | HR 67 | Temp 98.0°F | Ht 71.5 in | Wt 281.0 lb

## 2020-07-12 DIAGNOSIS — R079 Chest pain, unspecified: Secondary | ICD-10-CM | POA: Insufficient documentation

## 2020-07-12 DIAGNOSIS — Z20822 Contact with and (suspected) exposure to covid-19: Secondary | ICD-10-CM | POA: Insufficient documentation

## 2020-07-12 DIAGNOSIS — K2100 Gastro-esophageal reflux disease with esophagitis, without bleeding: Secondary | ICD-10-CM | POA: Insufficient documentation

## 2020-07-12 MED ORDER — PANTOPRAZOLE SODIUM 20 MG PO TBEC
20.0000 mg | DELAYED_RELEASE_TABLET | Freq: Every day | ORAL | 2 refills | Status: DC
Start: 2020-07-12 — End: 2021-12-12

## 2020-07-12 NOTE — Assessment & Plan Note (Addendum)
Unresolved chest pain.  Patient has a history of MI.  Pain radiating from mid chest across shoulders.  Completed troponin, CBC CMP, advised patient to increase hydration, follow-up in the emergency department with signs and symptoms of heart attack.  Patient verbalized understanding.  Advised patient to continue medication as prescribed blood pressures, and cholesterol medication and Plavix.  EKG completed

## 2020-07-12 NOTE — Progress Notes (Signed)
Acute Office Visit  Subjective:    Patient ID: Jason Morris, male    DOB: 09-22-1975, 45 y.o.   MRN: [id number]  Chief Complaint  Patient presents with   Fatigue   Headache   Chest Pain    Chest Pain  This is a recurrent problem. The current episode started in the past 7 days. The onset quality is gradual. The problem occurs intermittently. The problem has been resolved. The pain is present in the substernal region. The pain is moderate. The quality of the pain is described as burning. The pain radiates to the epigastrium. Associated symptoms include near-syncope. Pertinent negatives include no abdominal pain, cough, numbness or palpitations. The pain is aggravated by nothing. He has tried nothing for the symptoms. The treatment provided no relief. Risk factors include male gender and obesity.  His past medical history is significant for MI. Prior workup: EKG.    GERD, Follow up:  The patient was last seen for GERD 7 years ago. Changes made since that visit include Pepcid 20 mg tablet by mouth.  He reports good compliance with treatment. He is not having side effects. Marland Kitchen  He IS experiencing belching, bilious reflux, and chest pain. He is NOT experiencing dysphagia or early satiety    Patient reports recent exposure to positive COVID-19 and has since had symptoms of fatigue chest pain shortness of breath, lightheadedness and severe fatigue. Past Medical History:  Diagnosis Date   Anxiety    CAD (coronary artery disease)    a. Chest CTA 8/14 with coronary Ca and RCA aneurysmal dilatation    b. NSTEMI s/p LHC severe aneurysmal dilatation of the RCA, LAD and LCx without flow limiting lesions.   Chronic lower back pain    Depression    GERD (gastroesophageal reflux disease)    HLD (hyperlipidemia)    Hypertension    Migraines    Tobacco abuse     Past Surgical History:  Procedure Laterality Date   LEFT HEART CATH N/A 02/21/2014   Procedure: LEFT HEART CATH;  Surgeon:  Burnell Blanks, MD;  Location: [facility] CATH LAB;  Service: Cardiovascular;  Laterality: N/A;   MYRINGOTOMY WITH TUBE PLACEMENT Bilateral    "4-5 times as a child" (08/09/2012)    Family History  Problem Relation Age of Onset   Heart attack Father 17   Heart attack Paternal Grandfather 77   Heart attack Paternal Uncle 83    Social History   Socioeconomic History   Marital status: Divorced    Spouse name: Not on file   Number of children: Not on file   Years of education: Not on file   Highest education level: Not on file  Occupational History   Not on file  Tobacco Use   Smoking status: Heavy Smoker    Packs/day: 1.00    Years: 17.00    Pack years: 17.00    Types: Cigarettes   Smokeless tobacco: Never   Tobacco comments:    08/09/2012 "smoked 3 ppd til 1 month ago"   Substance and Sexual Activity   Alcohol use: No    Alcohol/week: 0.0 standard drinks    Comment: 08/09/2012 "had a mixed drink once in the past year"   Drug use: No   Sexual activity: Yes  Other Topics Concern   Not on file  Social History Narrative   Not on file   Social Determinants of Health   Financial Resource Strain: Not on file  Food Insecurity: Not on file  Transportation Needs: Not on file  Physical Activity: Not on file  Stress: Not on file  Social Connections: Not on file  Intimate Partner Violence: Not on file    Outpatient Medications Prior to Visit  Medication Sig Dispense Refill   clopidogrel (PLAVIX) 75 MG tablet Take 1 tablet (75 mg total) by mouth daily. 90 tablet 3   Coenzyme Q10 (COQ10) 100 MG CAPS Take 1 tablet by mouth daily.     L-Arginine 1000 MG TABS Take 1 tablet by mouth daily.     lisinopril (ZESTRIL) 40 MG tablet Take 0.5 tablets (20 mg total) by mouth 2 (two) times daily. 90 tablet 3   Multiple Vitamin (MULTIVITAMIN) tablet Take 1 tablet by mouth daily.     rosuvastatin (CRESTOR) 20 MG tablet Take 1 tablet (20 mg total) by mouth daily. 90 tablet 3   No  facility-administered medications prior to visit.    Allergies  Allergen Reactions   Lipitor [Atorvastatin] Hives   Lopressor [Metoprolol Tartrate] Shortness Of Breath    Felt like he was going to die , could not breath & syncope   Eggs Or Egg-Derived Products Other (See Comments)    Really bad stomach pain   Lactose Intolerance (Gi) Diarrhea    Review of Systems  Constitutional: Negative.   HENT: Negative.    Respiratory:  Negative for cough.   Cardiovascular:  Positive for chest pain and near-syncope. Negative for palpitations.  Gastrointestinal:  Negative for abdominal pain.  Neurological:  Negative for numbness.  All other systems reviewed and are negative.     Objective:    Physical Exam Vitals reviewed.  Constitutional:      Appearance: He is well-developed.  HENT:     Head: Normocephalic.     Nose: Nose normal.     Mouth/Throat:     Mouth: Mucous membranes are moist.     Pharynx: Oropharynx is clear.  Eyes:     Conjunctiva/sclera: Conjunctivae normal.  Cardiovascular:     Rate and Rhythm: Normal rate and regular rhythm.     Pulses: Normal pulses.     Heart sounds: Normal heart sounds.  Pulmonary:     Effort: Pulmonary effort is normal.     Breath sounds: Normal breath sounds.  Abdominal:     General: Bowel sounds are normal.  Skin:    General: Skin is warm.  Neurological:     Mental Status: He is alert and oriented to person, place, and time.  Psychiatric:        Behavior: Behavior normal.    BP 132/75   Pulse 67   Temp 98 F (36.7 C) (Temporal)   Ht 5' 11.5" (1.816 m)   Wt 281 lb (127.5 kg)   SpO2 97%   BMI 38.65 kg/m  Wt Readings from Last 3 Encounters:  07/12/20 281 lb (127.5 kg)  06/20/20 280 lb (127 kg)  12/08/18 277 lb (125.6 kg)    There are no preventive care reminders to display for this patient.  There are no preventive care reminders to display for this patient.   Lab Results  Component Value Date   TSH 1.360 01/27/2018    Lab Results  Component Value Date   WBC 6.7 06/20/2020   HGB 15.6 06/20/2020   HCT 45.6 06/20/2020   MCV 88 06/20/2020   PLT 200 06/20/2020   Lab Results  Component Value Date   NA 139 06/20/2020   K 4.5 06/20/2020   CO2 19 (L) 06/20/2020  GLUCOSE 91 06/20/2020   BUN 8 06/20/2020   CREATININE 0.87 06/20/2020   BILITOT 0.3 06/20/2020   ALKPHOS 60 06/20/2020   AST 22 06/20/2020   ALT 31 06/20/2020   PROT 6.9 06/20/2020   ALBUMIN 4.6 06/20/2020   CALCIUM 9.5 06/20/2020   ANIONGAP 8 02/21/2014   EGFR 110 06/20/2020   GFR 87.88 03/02/2013   Lab Results  Component Value Date   CHOL 193 06/20/2020   Lab Results  Component Value Date   HDL 30 (L) 06/20/2020   Lab Results  Component Value Date   LDLCALC 137 (H) 06/20/2020   Lab Results  Component Value Date   TRIG 144 06/20/2020   Lab Results  Component Value Date   CHOLHDL 6.4 (H) 06/20/2020   Lab Results  Component Value Date   HGBA1C 6.0 06/20/2020       Assessment & Plan:   Problem List Items Addressed This Visit       Digestive   Gastroesophageal reflux disease with esophagitis without hemorrhage   Relevant Medications   pantoprazole (PROTONIX) 20 MG tablet   Other Relevant Orders   CBC with Differential   Comprehensive metabolic panel     Other   Chest pain - Primary    Unresolved chest pain.  Patient has a history of MI.  Pain radiating from mid chest across shoulders.  Completed troponin, CBC CMP, advised patient to increase hydration, follow-up in the emergency department with signs and symptoms of heart attack.  Patient verbalized understanding.  Advised patient to continue medication as prescribed blood pressures, and cholesterol medication and Plavix.  EKG completed       Relevant Orders   EKG 12-Lead (Completed)   Troponin T   Close exposure to COVID-19 virus    Worsening signs of shortness of breath, lightheadedness, severe fatigue since being exposed to COVID positive [employer].   2 at home test came back negative for COVID-19.  Completed COVID-19 swab today results pending.  Education provided to patient printed handouts given.  Follow-up with worsening unresolved symptoms.       Relevant Orders   Novel Coronavirus, NAA ([facility])     Meds ordered this encounter  Medications   pantoprazole (PROTONIX) 20 MG tablet    Sig: Take 1 tablet (20 mg total) by mouth daily.    Dispense:  30 tablet    Refill:  2    Order Specific Question:   Supervising Provider    Answer:   Janora Norlander [[id number]]     Ivy Lynn, NP

## 2020-07-12 NOTE — Patient Instructions (Addendum)
Food Choices for Gastroesophageal Reflux Disease, Adult When you have gastroesophageal reflux disease (GERD), the foods you eat and your eating habits are very important. Choosing the right foods can help ease the discomfort of GERD. Consider working with a dietitian to help you [employer] choices. What are tips for following this plan? Reading food labels Look for foods that are low in saturated fat. Foods that have less than 5% of daily value (DV) of fat and 0 g of trans fats may help with your symptoms. Cooking Cook foods using methods other than frying. This may include baking, steaming, grilling, or broiling. These are all methods that do not need a lot of fat for cooking. To add flavor, try to use herbs that are low in spice and acidity. Meal planning  Choose healthy foods that are low in fat, such as fruits, vegetables, whole grains, low-fat dairy products, lean meats, fish, and poultry. Eat frequent, small meals instead of three large meals each day. Eat your meals slowly, in a relaxed setting. Avoid bending over or lying down until 2-3 hours after eating. Limit high-fat foods such as fatty meats or fried foods. Limit your intake of fatty foods, such as oils, butter, and shortening. Avoid the following as told by your health care provider: Foods that cause symptoms. These may be different for different people. Keep a food diary to keep track of foods that cause symptoms. Alcohol. Drinking large amounts of liquid with meals. Eating meals during the 2-3 hours before bed.  Lifestyle Maintain a healthy weight. Ask your health care provider what weight is healthy for you. If you need to lose weight, work with your health care provider to do so safely. Exercise for at least 30 minutes on 5 or more days each week, or as told by your health care provider. Avoid wearing clothes that fit tightly around your waist and chest. Do not use any products that contain nicotine or tobacco. These  products include cigarettes, chewing tobacco, and vaping devices, such as e-cigarettes. If you need help quitting, ask your health care provider. Sleep with the head of your bed raised. Use a wedge under the mattress or blocks under the bed frame to raise the head of the bed. Chew sugar-free gum after mealtimes. What foods should I eat?  Eat a healthy, well-balanced diet of fruits, vegetables, whole grains, low-fat dairy products, lean meats, fish, and poultry. Each person is different. Foods that may trigger symptoms in one person may not trigger any symptoms in another person. Work with your health care provider to identify foods that are safe foryou. The items listed above may not be a complete list of recommended foods and beverages. Contact a dietitian for more information. What foods should I avoid? Limiting some of these foods may help manage the symptoms of GERD. Everyone is different. Consult a dietitian or your health care provider to help youidentify the exact foods to avoid, if any. Fruits Any fruits prepared with added fat. Any fruits that cause symptoms. For some people this may include citrus fruits, such as oranges, grapefruit, pineapple,and lemons. Vegetables Deep-fried vegetables. [place] fries. Any vegetables prepared with added fat. Any vegetables that cause symptoms. For some people, this may include tomatoesand tomato products, chili peppers, onions and garlic, and horseradish. Grains Pastries or quick breads with added fat. Meats and other proteins High-fat meats, such as fatty beef or pork, hot dogs, ribs, ham, sausage, salami, and bacon. Fried meat or protein, including fried fish and friedchicken.  Nuts and nut butters, in large amounts. Dairy Whole milk and chocolate milk. Sour cream. Cream. Ice cream. Cream cheese.Milkshakes. Fats and oils Butter. Margarine. Shortening. Ghee. Beverages Coffee and tea, with or without caffeine. Carbonated beverages. Sodas. Energy  drinks. Fruit juice made with acidic fruits, such as orange or grapefruit.Tomato juice. Alcoholic drinks. Sweets and desserts Chocolate and cocoa. Donuts. Seasonings and condiments Pepper. Peppermint and spearmint. Added salt. Any condiments, herbs, or seasonings that cause symptoms. For some people, this may include curry, hotsauce, or vinegar-based salad dressings. The items listed above may not be a complete list of foods and beverages to avoid. Contact a dietitian for more information. Questions to ask your health care provider Diet and lifestyle changes are usually the first steps that are taken to manage symptoms of GERD. If diet and lifestyle changes do not improve your symptoms,talk with your health care provider about taking medicines. Where to find more information International Foundation for Gastrointestinal Disorders: aboutgerd.org Summary When you have gastroesophageal reflux disease (GERD), food and lifestyle choices may be very helpful in easing the discomfort of GERD. Eat frequent, small meals instead of three large meals each day. Eat your meals slowly, in a relaxed setting. Avoid bending over or lying down until 2-3 hours after eating. Limit high-fat foods such as fatty meats or fried foods. This information is not intended to replace advice given to you by your health care provider. Make sure you discuss any questions you have with your healthcare provider. Document Revised: 07/06/2019 Document Reviewed: 07/06/2019 Elsevier Patient Education  2022 Elsevier Inc. 10 Things You Can Do to Manage Your COVID-19 Symptoms at Home If you have possible or confirmed COVID-19 Stay home except to get medical care. Monitor your symptoms carefully. If your symptoms get worse, call your healthcare provider immediately. Get rest and stay hydrated. If you have a medical appointment, call the healthcare provider ahead of time and tell them that you have or may have COVID-19. For medical  emergencies, call 911 and notify the dispatch personnel that you have or may have COVID-19. Cover your cough and sneezes with a tissue or use the inside of your elbow. Wash your hands often with soap and water for at least 20 seconds or clean your hands with an alcohol-based hand sanitizer that contains at least 60% alcohol. As much as possible, stay in a specific room and away from other people in your home. Also, you should use a separate bathroom, if available. If you need to be around other people in or outside of the home, wear a mask. Avoid sharing personal items with other people in your household, like dishes, towels, and bedding. Clean all surfaces that are touched often, like counters, tabletops, and doorknobs. Use household cleaning sprays or wipes according to the label instructions. [employer] 07/24/2019 This information is not intended to replace advice given to you by your health care provider. Make sure you discuss any questions you have with your healthcare provider. Document Revised: 02/12/2020 Document Reviewed: 02/12/2020 Elsevier Patient Education  2022 [employer].

## 2020-07-12 NOTE — Assessment & Plan Note (Signed)
GERD not well controlled with Pepcid.  Recent worsening symptoms of acid reflux with chest pains similar to MI radiating across chest.  Change medication to Protonix 20 mg tablet by mouth daily provided education on diet modification and weight loss.  Printed handouts given to patient.  Rx sent to pharmacy.  Follow-up with worsening unresolved symptoms.

## 2020-07-12 NOTE — Assessment & Plan Note (Signed)
Worsening signs of shortness of breath, lightheadedness, severe fatigue since being exposed to COVID positive [employer].  2 at home test came back negative for COVID-19.  Completed COVID-19 swab today results pending.  Education provided to patient printed handouts given.  Follow-up with worsening unresolved symptoms.

## 2020-07-13 ENCOUNTER — Emergency Department (HOSPITAL_COMMUNITY)
Admission: EM | Admit: 2020-07-13 | Discharge: 2020-07-13 | Discharge status: Against Medical Advice | Payer: BLUE CROSS/BLUE SHIELD

## 2020-07-13 ENCOUNTER — Encounter: Payer: Self-pay | Admitting: Nurse Practitioner

## 2020-07-13 ENCOUNTER — Encounter: Payer: Self-pay | Admitting: Family Medicine

## 2020-07-13 ENCOUNTER — Emergency Department (HOSPITAL_BASED_OUTPATIENT_CLINIC_OR_DEPARTMENT_OTHER)
Admission: EM | Admit: 2020-07-13 | Discharge: 2020-07-13 | Disposition: A | Discharge status: Home / Self Care | Payer: Self-pay | Attending: Emergency Medicine | Admitting: Emergency Medicine

## 2020-07-13 ENCOUNTER — Emergency Department (HOSPITAL_BASED_OUTPATIENT_CLINIC_OR_DEPARTMENT_OTHER): Payer: Self-pay | Admitting: Radiology

## 2020-07-13 ENCOUNTER — Other Ambulatory Visit: Payer: Self-pay

## 2020-07-13 ENCOUNTER — Encounter (HOSPITAL_BASED_OUTPATIENT_CLINIC_OR_DEPARTMENT_OTHER): Payer: Self-pay | Admitting: Emergency Medicine

## 2020-07-13 ENCOUNTER — Ambulatory Visit (INDEPENDENT_AMBULATORY_CARE_PROVIDER_SITE_OTHER): Payer: Self-pay | Admitting: Nurse Practitioner

## 2020-07-13 VITALS — BP 152/95 | HR 94 | Temp 98.0°F | Ht 71.8 in | Wt 281.0 lb

## 2020-07-13 DIAGNOSIS — Z951 Presence of aortocoronary bypass graft: Secondary | ICD-10-CM | POA: Insufficient documentation

## 2020-07-13 DIAGNOSIS — Z79899 Other long term (current) drug therapy: Secondary | ICD-10-CM | POA: Insufficient documentation

## 2020-07-13 DIAGNOSIS — K219 Gastro-esophageal reflux disease without esophagitis: Secondary | ICD-10-CM

## 2020-07-13 DIAGNOSIS — Z8616 Personal history of COVID-19: Secondary | ICD-10-CM | POA: Insufficient documentation

## 2020-07-13 DIAGNOSIS — Z7902 Long term (current) use of antithrombotics/antiplatelets: Secondary | ICD-10-CM | POA: Insufficient documentation

## 2020-07-13 DIAGNOSIS — R519 Headache, unspecified: Secondary | ICD-10-CM | POA: Insufficient documentation

## 2020-07-13 DIAGNOSIS — I2510 Atherosclerotic heart disease of native coronary artery without angina pectoris: Secondary | ICD-10-CM | POA: Insufficient documentation

## 2020-07-13 DIAGNOSIS — F17210 Nicotine dependence, cigarettes, uncomplicated: Secondary | ICD-10-CM | POA: Insufficient documentation

## 2020-07-13 DIAGNOSIS — R0789 Other chest pain: Secondary | ICD-10-CM | POA: Insufficient documentation

## 2020-07-13 DIAGNOSIS — I10 Essential (primary) hypertension: Secondary | ICD-10-CM | POA: Insufficient documentation

## 2020-07-13 DIAGNOSIS — R079 Chest pain, unspecified: Secondary | ICD-10-CM

## 2020-07-13 DIAGNOSIS — R1013 Epigastric pain: Secondary | ICD-10-CM | POA: Insufficient documentation

## 2020-07-13 LAB — CBC WITH DIFFERENTIAL/PLATELET
Basophils Absolute: 0 10*3/uL (ref 0.0–0.2)
Basos: 1 %
EOS (ABSOLUTE): 0.1 10*3/uL (ref 0.0–0.4)
Eos: 3 %
Hematocrit: 32.8 % — ABNORMAL LOW (ref 37.5–51.0)
Hemoglobin: 10 g/dL — ABNORMAL LOW (ref 13.0–17.7)
Immature Grans (Abs): 0 10*3/uL (ref 0.0–0.1)
Immature Granulocytes: 0 %
Lymphocytes Absolute: 1.3 10*3/uL (ref 0.7–3.1)
Lymphs: 31 %
MCH: 27.2 pg (ref 26.6–33.0)
MCHC: 30.5 g/dL — ABNORMAL LOW (ref 31.5–35.7)
MCV: 89 fL (ref 79–97)
Monocytes Absolute: 0.6 10*3/uL (ref 0.1–0.9)
Monocytes: 14 %
NRBC: 1 % — ABNORMAL HIGH (ref 0–0)
Neutrophils Absolute: 2 10*3/uL (ref 1.4–7.0)
Neutrophils: 51 %
Platelets: 156 10*3/uL (ref 150–450)
RBC: 3.68 x10E6/uL — ABNORMAL LOW (ref 4.14–5.80)
RDW: 14.4 % (ref 11.6–15.4)
WBC: 4.1 10*3/uL (ref 3.4–10.8)

## 2020-07-13 LAB — BASIC METABOLIC PANEL
Anion gap: 9 (ref 5–15)
BUN: 10 mg/dL (ref 6–20)
CO2: 24 mmol/L (ref 22–32)
Calcium: 9.8 mg/dL (ref 8.9–10.3)
Chloride: 106 mmol/L (ref 98–111)
Creatinine, Ser: 0.88 mg/dL (ref 0.61–1.24)
GFR, Estimated: >60 mL/min (ref >60)
Glucose, Bld: 94 mg/dL (ref 70–99)
Potassium: 4.1 mmol/L (ref 3.5–5.1)
Sodium: 139 mmol/L (ref 135–145)

## 2020-07-13 LAB — COMPREHENSIVE METABOLIC PANEL
ALT: 31 IU/L (ref 0–44)
AST: 25 IU/L (ref 0–40)
Albumin/Globulin Ratio: 2.2 (ref 1.2–2.2)
Albumin: 4.3 g/dL (ref 4.0–5.0)
Alkaline Phosphatase: 52 IU/L (ref 44–121)
BUN/Creatinine Ratio: 10 (ref 9–20)
BUN: 11 mg/dL (ref 6–24)
Bilirubin Total: 0.2 mg/dL (ref 0.0–1.2)
CO2: 23 mmol/L (ref 20–29)
Calcium: 9.5 mg/dL (ref 8.7–10.2)
Chloride: 106 mmol/L (ref 96–106)
Creatinine, Ser: 1.05 mg/dL (ref 0.76–1.27)
Globulin, Total: 2 g/dL (ref 1.5–4.5)
Glucose: 132 mg/dL — ABNORMAL HIGH (ref 65–99)
Potassium: 4.5 mmol/L (ref 3.5–5.2)
Sodium: 138 mmol/L (ref 134–144)
Total Protein: 6.3 g/dL (ref 6.0–8.5)
eGFR: 90 mL/min/{1.73_m2} (ref >59)

## 2020-07-13 LAB — TROPONIN I (HIGH SENSITIVITY)
Troponin I (High Sensitivity): 2 ng/L (ref <18)
Troponin I (High Sensitivity): 2 ng/L (ref <18)

## 2020-07-13 LAB — TROPONIN T: Troponin T (Highly Sensitive): <6 ng/L (ref 0–22)

## 2020-07-13 LAB — CBC
HCT: 46.9 % (ref 39.0–52.0)
Hemoglobin: 15.6 g/dL (ref 13.0–17.0)
MCH: 29.5 pg (ref 26.0–34.0)
MCHC: 33.3 g/dL (ref 30.0–36.0)
MCV: 88.8 fL (ref 80.0–100.0)
Platelets: 199 10*3/uL (ref 150–400)
RBC: 5.28 MIL/uL (ref 4.22–5.81)
RDW: 12.2 % (ref 11.5–15.5)
WBC: 7.2 10*3/uL (ref 4.0–10.5)
nRBC: 0 % (ref 0.0–0.2)

## 2020-07-13 LAB — SARS-COV-2, NAA 2 DAY TAT

## 2020-07-13 LAB — NOVEL CORONAVIRUS, NAA: SARS-CoV-2, NAA: NOT DETECTED

## 2020-07-13 MED ORDER — FAMOTIDINE 20 MG PO TABS
40.0000 mg | ORAL_TABLET | Freq: Once | ORAL | Status: AC
  Administered 2020-07-13: 40 mg via ORAL
  Filled 2020-07-13: qty 2

## 2020-07-13 MED ORDER — LIDOCAINE VISCOUS HCL 2 % MT SOLN
15.0000 mL | Freq: Once | OROMUCOSAL | Status: AC
  Administered 2020-07-13: 15 mL via ORAL
  Filled 2020-07-13: qty 15

## 2020-07-13 MED ORDER — SUCRALFATE 1 G PO TABS: 1.0000 g | ORAL_TABLET | Freq: Four times a day (QID) | ORAL | 0 refills | Status: DC

## 2020-07-13 MED ORDER — ALUM & MAG HYDROXIDE-SIMETH 200-200-20 MG/5ML PO SUSP
30.0000 mL | Freq: Once | ORAL | Status: AC
  Administered 2020-07-13: 30 mL via ORAL
  Filled 2020-07-13: qty 30

## 2020-07-13 NOTE — ED Notes (Signed)
IV placed in the L forearm without complication and labs obtained. Labs sent to lab.

## 2020-07-13 NOTE — ED Notes (Signed)
Pt come up to me and told me " im going to leave because my doctor is having a fit because I am having a heart attack and im out here in the lobby."   Pt has only been here .   Note that I have just return to the lobby at this point. Pt walked out and took off his arm band.

## 2020-07-13 NOTE — ED Notes (Signed)
Trop obtained and sent to lab 

## 2020-07-13 NOTE — ED Provider Notes (Signed)
[facility] EMERGENCY DEPT Provider Note   CSN: [id number] Arrival date & time: 07/13/20  1530     History Chief Complaint  Patient presents with   Chest Pain    Jason Morris is a 45 y.o. male.  45 year old male who presents with intermittent epigastric and left upper quadrant chest pain for the past several days.  Patient states that is worse with certain foods and better with cool liquids.  No anginal type quality to this.  Denies any fever, cough, congestion.  No recent black or tarry stools.  Mild headache but he does work outside and does not drink adequate liquids.  No treatment use prior to arrival      Past Medical History:  Diagnosis Date   Anxiety    CAD (coronary artery disease)    a. Chest CTA 8/14 with coronary Ca and RCA aneurysmal dilatation    b. NSTEMI s/p LHC severe aneurysmal dilatation of the RCA, LAD and LCx without flow limiting lesions.   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   CAD (coronary artery disease)    History of non-ST elevation myocardial infarction (NSTEMI) 02/21/2014   Dyslipidemia 11/04/2013   Tobacco abuse 11/04/2013   Essential hypertension 09/03/2012   GERD (gastroesophageal reflux disease) 07/18/2012   GAD (generalized anxiety disorder) 07/18/2012   Insomnia 07/18/2012    Past Surgical History:  Procedure Laterality Date   LEFT HEART CATH N/A 02/21/2014   Procedure: LEFT HEART CATH;  Surgeon: Kathleene Hazel, MD;  Location: [facility] CATH LAB;  Service: Cardiovascular;   Laterality: N/A;   MYRINGOTOMY WITH TUBE PLACEMENT Bilateral    "4-5 times as a child" (08/09/2012)       Family History  Problem Relation Age of Onset   Heart attack Father 24   Heart attack Paternal Grandfather 65   Heart attack Paternal Uncle 22    Social History   Tobacco Use   Smoking status: Heavy Smoker    Packs/day: 1.00    Years: 17.00    Pack years: 17.00    Types: Cigarettes   Smokeless tobacco: Never   Tobacco comments:    08/09/2012 "smoked 3 ppd til 1 month ago"   Substance Use Topics   Alcohol use: No    Alcohol/week: 0.0 standard drinks    Comment: 08/09/2012 "had a mixed drink once in the past year"   Drug use: No    Home Medications Prior to Admission medications   Medication Sig Start Date End Date Taking? Authorizing Provider  clopidogrel (PLAVIX) 75 MG tablet Take 1 tablet (75 mg total) by mouth daily. 06/20/20   Dettinger, Elige Radon, MD  Coenzyme Q10 (COQ10) 100 MG CAPS Take 1 tablet by mouth daily.    [provider]  L-Arginine 1000 MG TABS Take 1 tablet by mouth daily.    [provider]  lisinopril (ZESTRIL) 40 MG tablet Take 0.5 tablets (20 mg total) by mouth 2 (two) times daily. 06/18/20   Daryll Drown, NP  Multiple  Vitamin (MULTIVITAMIN) tablet Take 1 tablet by mouth daily.    [provider]  pantoprazole (PROTONIX) 20 MG tablet Take 1 tablet (20 mg total) by mouth daily. 07/12/20   Daryll Drown, NP  rosuvastatin (CRESTOR) 20 MG tablet Take 1 tablet (20 mg total) by mouth daily. 06/16/20   Dettinger, Elige Radon, MD  atorvastatin (LIPITOR) 80 MG tablet Take 1 tablet (80 mg total) by mouth daily at 6 PM. 08/09/12 08/17/12  Tereso Newcomer T, PA-C    Allergies    Lipitor [atorvastatin], Lopressor [metoprolol tartrate], Eggs or egg-derived products, and Lactose intolerance (gi)  Review of Systems   Review of Systems  All other systems reviewed and are negative.  Physical Exam Updated Vital Signs BP (!) 142/85 (BP Location:  Right Arm)   Pulse 87   Temp 97.9 F (36.6 C)   Resp 14   SpO2 98%   Physical Exam Vitals and nursing note reviewed.  Constitutional:      General: He is not in acute distress.    Appearance: Normal appearance. He is well-developed. He is not toxic-appearing.  HENT:     Head: Normocephalic and atraumatic.  Eyes:     General: Lids are normal.     Conjunctiva/sclera: Conjunctivae normal.     Pupils: Pupils are equal, round, and reactive to light.  Neck:     Thyroid: No thyroid mass.     Trachea: No tracheal deviation.  Cardiovascular:     Rate and Rhythm: Normal rate and regular rhythm.     Heart sounds: Normal heart sounds. No murmur heard.   No gallop.  Pulmonary:     Effort: Pulmonary effort is normal. No respiratory distress.     Breath sounds: Normal breath sounds. No stridor. No decreased breath sounds, wheezing, rhonchi or rales.  Abdominal:     General: There is no distension.     Palpations: Abdomen is soft.     Tenderness: There is abdominal tenderness in the epigastric area. There is no rebound.  Musculoskeletal:        General: No tenderness. Normal range of motion.     Cervical back: Normal range of motion and neck supple.  Skin:    General: Skin is warm and dry.     Findings: No abrasion or rash.  Neurological:     Mental Status: He is alert and oriented to person, place, and time. Mental status is at baseline.     GCS: GCS eye subscore is 4. GCS verbal subscore is 5. GCS motor subscore is 6.     Cranial Nerves: Cranial nerves are intact. No cranial nerve deficit.     Sensory: No sensory deficit.     Motor: Motor function is intact.  Psychiatric:        Attention and Perception: Attention normal.        Speech: Speech normal.        Behavior: Behavior normal.    ED Results / Procedures / Treatments   Labs (all labs ordered are listed, but only abnormal results are displayed) Labs Reviewed  BASIC METABOLIC PANEL  CBC  TROPONIN I (HIGH SENSITIVITY)   TROPONIN I (HIGH SENSITIVITY)    EKG EKG Interpretation  Date/Time:  Wednesday July 13 2020 15:42:57 EDT Ventricular Rate:  97 PR Interval:  136 QRS Duration: 90 QT Interval:  320 QTC Calculation: 406 R Axis:   84 Text Interpretation: Normal sinus rhythm Normal ECG Confirmed by Lorre Nick ([id number]) on 07/13/2020 3:47:18 PM  Radiology DG Chest 2 View  Result Date: 07/13/2020 CLINICAL DATA:  Chest pain EXAM: CHEST - 2 VIEW COMPARISON:  02/20/2014 FINDINGS: The heart size and mediastinal contours are within normal limits. Mild streaky left basilar opacity. Lungs are otherwise clear. No pleural effusion or pneumothorax. The visualized skeletal structures are unremarkable. IMPRESSION: Mild streaky left basilar opacity, which may represent atelectasis or developing infiltrate. Electronically Signed   By: Duanne Guess D.O.   On: 07/13/2020 16:22    Procedures Procedures   Medications Ordered in ED Medications  alum & mag hydroxide-simeth (MAALOX/MYLANTA) 200-200-20 MG/5ML suspension 30 mL (has no administration in time range)    And  lidocaine (XYLOCAINE) 2 % viscous mouth solution 15 mL (has no administration in time range)  famotidine (PEPCID) tablet 40 mg (has no administration in time range)    ED Course  I have reviewed the triage vital signs and the nursing notes.  Pertinent labs & imaging results that were available during my care of the patient were reviewed by me and considered in my medical decision making (see chart for details).    MDM Rules/Calculators/A&P                         Patient is EKG without acute changes here at this time.  Labs are reassuring here.  Suspect GERD.  Will do delta troponin and likely discharge Final Clinical Impression(s) / ED Diagnoses Final diagnoses:  None    Rx / DC Orders ED Discharge Orders     None        Lorre Nick, MD 07/13/20 [place] Fruit

## 2020-07-13 NOTE — ED Notes (Signed)
Pt is at [employer] [facility]

## 2020-07-13 NOTE — Progress Notes (Signed)
Acute Office Visit  Subjective:    Patient ID: Jason Morris, male    DOB: 1975/01/25, 45 y.o.   MRN: [id number]  Chief Complaint  Patient presents with   Chest Pain    Pressure in left arm. HX of heart attack. Patient states he does feel very panic.     Chest Pain  This is a recurrent problem. The current episode started today. The onset quality is sudden. The problem occurs constantly. The problem has been unchanged. The pain is present in the substernal region. The pain is moderate. The quality of the pain is described as crushing and tightness. The pain radiates to the left arm. Associated symptoms include dizziness. Pertinent negatives include no abdominal pain, lower extremity edema or nausea.       Past Medical History:  Diagnosis Date   Anxiety    CAD (coronary artery disease)    a. Chest CTA 8/14 with coronary Ca and RCA aneurysmal dilatation    b. NSTEMI s/p LHC severe aneurysmal dilatation of the RCA, LAD and LCx without flow limiting lesions.   Chronic lower back pain    Depression    GERD (gastroesophageal reflux disease)    HLD (hyperlipidemia)    Hypertension    Migraines    Tobacco abuse     Past Surgical History:  Procedure Laterality Date   LEFT HEART CATH N/A 02/21/2014   Procedure: LEFT HEART CATH;  Surgeon: Burnell Blanks, MD;  Location: [facility] CATH LAB;  Service: Cardiovascular;  Laterality: N/A;   MYRINGOTOMY WITH TUBE PLACEMENT Bilateral    "4-5 times as a child" (08/09/2012)    Family History  Problem Relation Age of Onset   Heart attack Father 2   Heart attack Paternal Grandfather 25   Heart attack Paternal Uncle 31    Social History   Socioeconomic History   Marital status: Divorced    Spouse name: Not on file   Number of children: Not on file   Years of education: Not on file   Highest education level: Not on file  Occupational History   Not on file  Tobacco Use   Smoking status: Heavy Smoker    Packs/day: 1.00    Years:  17.00    Pack years: 17.00    Types: Cigarettes   Smokeless tobacco: Never   Tobacco comments:    08/09/2012 "smoked 3 ppd til 1 month ago"   Substance and Sexual Activity   Alcohol use: No    Alcohol/week: 0.0 standard drinks    Comment: 08/09/2012 "had a mixed drink once in the past year"   Drug use: No   Sexual activity: Yes  Other Topics Concern   Not on file  Social History Narrative   Not on file   Social Determinants of Health   Financial Resource Strain: Not on file  Food Insecurity: Not on file  Transportation Needs: Not on file  Physical Activity: Not on file  Stress: Not on file  Social Connections: Not on file  Intimate Partner Violence: Not on file    Outpatient Medications Prior to Visit  Medication Sig Dispense Refill   clopidogrel (PLAVIX) 75 MG tablet Take 1 tablet (75 mg total) by mouth daily. 90 tablet 3   Coenzyme Q10 (COQ10) 100 MG CAPS Take 1 tablet by mouth daily.     L-Arginine 1000 MG TABS Take 1 tablet by mouth daily.     lisinopril (ZESTRIL) 40 MG tablet Take 0.5 tablets (20 mg total)  by mouth 2 (two) times daily. 90 tablet 3   Multiple Vitamin (MULTIVITAMIN) tablet Take 1 tablet by mouth daily.     pantoprazole (PROTONIX) 20 MG tablet Take 1 tablet (20 mg total) by mouth daily. 30 tablet 2   rosuvastatin (CRESTOR) 20 MG tablet Take 1 tablet (20 mg total) by mouth daily. 90 tablet 3   No facility-administered medications prior to visit.    Allergies  Allergen Reactions   Lipitor [Atorvastatin] Hives   Lopressor [Metoprolol Tartrate] Shortness Of Breath    Felt like he was going to die , could not breath & syncope   Eggs Or Egg-Derived Products Other (See Comments)    Really bad stomach pain   Lactose Intolerance (Gi) Diarrhea    Review of Systems  Constitutional: Negative.   HENT: Negative.    Cardiovascular:  Positive for chest pain.  Gastrointestinal:  Negative for abdominal pain and nausea.  Musculoskeletal: Negative.   Skin:   Negative for rash.  Neurological:  Positive for dizziness.  All other systems reviewed and are negative.     Objective:    Physical Exam Vitals and nursing note reviewed.  Constitutional:      Appearance: Normal appearance. He is obese.  HENT:     Head: Normocephalic.     Nose: Nose normal.  Eyes:     Conjunctiva/sclera: Conjunctivae normal.     Pupils: Pupils are equal, round, and reactive to light.  Neck:     Vascular: No JVD.  Cardiovascular:     Rate and Rhythm: Normal rate and regular rhythm.     Pulses: Normal pulses.     Heart sounds: Normal heart sounds.  Pulmonary:     Effort: Pulmonary effort is normal.     Breath sounds: Normal breath sounds.  Abdominal:     General: Bowel sounds are normal.  Musculoskeletal:        General: Normal range of motion.     Cervical back: Normal range of motion.  Neurological:     Mental Status: He is alert and oriented to person, place, and time.  Cardiology referral  BP (!) 152/95   Pulse 94   Temp 98 F (36.7 C) (Temporal)   Ht 5' 11.8" (1.824 m)   Wt 281 lb (127.5 kg)   SpO2 100%   BMI 38.32 kg/m  Wt Readings from Last 3 Encounters:  07/13/20 281 lb (127.5 kg)  07/12/20 281 lb (127.5 kg)  06/20/20 280 lb (127 kg)    There are no preventive care reminders to display for this patient.  There are no preventive care reminders to display for this patient.   Lab Results  Component Value Date   TSH 1.360 01/27/2018   Lab Results  Component Value Date   WBC 4.1 07/12/2020   HGB 10.0 (L) 07/12/2020   HCT 32.8 (L) 07/12/2020   MCV 89 07/12/2020   PLT 156 07/12/2020   Lab Results  Component Value Date   NA 138 07/12/2020   K 4.5 07/12/2020   CO2 23 07/12/2020   GLUCOSE 132 (H) 07/12/2020   BUN 11 07/12/2020   CREATININE 1.05 07/12/2020   BILITOT 0.2 07/12/2020   ALKPHOS 52 07/12/2020   AST 25 07/12/2020   ALT 31 07/12/2020   PROT 6.3 07/12/2020   ALBUMIN 4.3 07/12/2020   CALCIUM 9.5 07/12/2020    ANIONGAP 8 02/21/2014   EGFR 90 07/12/2020   GFR 87.88 03/02/2013   Lab Results  Component Value Date  CHOL 193 06/20/2020   Lab Results  Component Value Date   HDL 30 (L) 06/20/2020   Lab Results  Component Value Date   LDLCALC 137 (H) 06/20/2020   Lab Results  Component Value Date   TRIG 144 06/20/2020   Lab Results  Component Value Date   CHOLHDL 6.4 (H) 06/20/2020   Lab Results  Component Value Date   HGBA1C 6.0 06/20/2020       Assessment & Plan:   Problem List Items Addressed This Visit       Other   Chest pain - Primary    Patient returns today with symptoms of chest pain, chest pressure radiating to left arm.  After assessment patient key continues to have mild chest pain.  Neuro assessment intact.  Advised patient to go to emergency department.  Completed another EKG showing normal sinus rhythm.  Patient has a family history of MI, currently patient has CAD and hypertension, patient left clinic driving himself and promised to go to emergency department.  Will complete cardiology referral.  Patient will need echocardiogram, stress test and possible nitroglycerin..  Follow-up with worsening unresolved symptoms.       Relevant Orders   Ambulatory referral to Cardiology     No orders of the defined types were placed in this encounter.    Ivy Lynn, NP

## 2020-07-13 NOTE — Assessment & Plan Note (Signed)
Patient returns today with symptoms of chest pain, chest pressure radiating to left arm.  After assessment patient key continues to have mild chest pain.  Neuro assessment intact.  Advised patient to go to emergency department.  Completed another EKG showing normal sinus rhythm.  Patient has a family history of MI, currently patient has CAD and hypertension, patient left clinic driving himself and promised to go to emergency department.  Will complete cardiology referral.  Patient will need echocardiogram, stress test and possible nitroglycerin..  Follow-up with worsening unresolved symptoms.

## 2020-07-13 NOTE — ED Triage Notes (Addendum)
Pt arrives to ED with c/o of left sided chest pain x3 days. Pt reports fatigue, headache, and dizziness x1 week. The CP is described as a burning sensation that is relieved with fluids and worsened with food. Pt denies N/V. Pt smokes 1.5 ppd x20 years.

## 2020-07-21 ENCOUNTER — Telehealth: Payer: Self-pay | Admitting: Family Medicine

## 2020-07-21 ENCOUNTER — Encounter: Payer: Self-pay | Admitting: Family Medicine

## 2020-07-21 ENCOUNTER — Ambulatory Visit (INDEPENDENT_AMBULATORY_CARE_PROVIDER_SITE_OTHER): Payer: Self-pay | Admitting: Family Medicine

## 2020-07-21 ENCOUNTER — Other Ambulatory Visit: Payer: Self-pay

## 2020-07-21 VITALS — BP 135/90 | HR 64 | Ht 71.8 in | Wt 279.0 lb

## 2020-07-21 DIAGNOSIS — Z716 Tobacco abuse counseling: Secondary | ICD-10-CM

## 2020-07-21 DIAGNOSIS — K219 Gastro-esophageal reflux disease without esophagitis: Secondary | ICD-10-CM

## 2020-07-21 DIAGNOSIS — F410 Panic disorder [episodic paroxysmal anxiety] without agoraphobia: Secondary | ICD-10-CM

## 2020-07-21 LAB — CBC WITH DIFFERENTIAL/PLATELET
Basophils Absolute: 0 10*3/uL (ref 0.0–0.2)
Basos: 0 %
EOS (ABSOLUTE): 0.1 10*3/uL (ref 0.0–0.4)
Eos: 2 %
Hematocrit: 45.9 % (ref 37.5–51.0)
Hemoglobin: 15.6 g/dL (ref 13.0–17.7)
Immature Grans (Abs): 0.1 10*3/uL (ref 0.0–0.1)
Immature Granulocytes: 1 %
Lymphocytes Absolute: 2.7 10*3/uL (ref 0.7–3.1)
Lymphs: 42 %
MCH: 30.2 pg (ref 26.6–33.0)
MCHC: 34 g/dL (ref 31.5–35.7)
MCV: 89 fL (ref 79–97)
Monocytes Absolute: 0.7 10*3/uL (ref 0.1–0.9)
Monocytes: 10 %
Neutrophils Absolute: 2.8 10*3/uL (ref 1.4–7.0)
Neutrophils: 45 %
Platelets: 173 10*3/uL (ref 150–450)
RBC: 5.17 x10E6/uL (ref 4.14–5.80)
RDW: 11.9 % (ref 11.6–15.4)
WBC: 6.4 10*3/uL (ref 3.4–10.8)

## 2020-07-21 LAB — CMP14+EGFR
ALT: 35 IU/L (ref 0–44)
AST: 27 IU/L (ref 0–40)
Albumin/Globulin Ratio: 1.8 (ref 1.2–2.2)
Albumin: 4.3 g/dL (ref 4.0–5.0)
Alkaline Phosphatase: 53 IU/L (ref 44–121)
BUN/Creatinine Ratio: 8 — ABNORMAL LOW (ref 9–20)
BUN: 8 mg/dL (ref 6–24)
Bilirubin Total: 0.3 mg/dL (ref 0.0–1.2)
CO2: 23 mmol/L (ref 20–29)
Calcium: 9.5 mg/dL (ref 8.7–10.2)
Chloride: 103 mmol/L (ref 96–106)
Creatinine, Ser: 1.02 mg/dL (ref 0.76–1.27)
Globulin, Total: 2.4 g/dL (ref 1.5–4.5)
Glucose: 115 mg/dL — ABNORMAL HIGH (ref 65–99)
Potassium: 4.8 mmol/L (ref 3.5–5.2)
Sodium: 138 mmol/L (ref 134–144)
Total Protein: 6.7 g/dL (ref 6.0–8.5)
eGFR: 94 mL/min/{1.73_m2} (ref >59)

## 2020-07-21 MED ORDER — TRAZODONE HCL 50 MG PO TABS: 25.0000 mg | ORAL_TABLET | Freq: Every evening | ORAL | 2 refills | Status: DC | PRN

## 2020-07-21 MED ORDER — VARENICLINE TARTRATE 1 MG PO TABS
1.0000 mg | ORAL_TABLET | Freq: Two times a day (BID) | ORAL | 2 refills | Status: DC
Start: 2020-07-21 — End: 2021-01-23

## 2020-07-21 MED ORDER — VARENICLINE TARTRATE 0.5 MG X 11 & 1 MG X 42 PO MISC: ORAL | 0 refills | Status: DC

## 2020-07-21 NOTE — Telephone Encounter (Signed)
As far as safety, I do not think it would be unsafe, its not like it goes rotten but it may not be as effective because some medications degrade over time but he could try it if he wants to.

## 2020-07-21 NOTE — Telephone Encounter (Signed)
Patient aware and verbalized understanding. °

## 2020-07-21 NOTE — Progress Notes (Signed)
BP 135/90   Pulse 64   Ht 5' 11.8" (1.824 m)   Wt 279 lb (126.6 kg)   SpO2 100%   BMI 38.05 kg/m    Subjective:   Patient ID: Jason Morris, male    DOB: 11-18-1975, 45 y.o.   MRN: [id number]  HPI: Jason Morris is a 45 y.o. male presenting on 07/21/2020 for ER follow up (Chest pain/GERD. Pt concerned about recent labs)   HPI ER follow-up. Patient is coming in for ER for chest pain and anxiety.  He says he got his lab results online and started looking up things online and that made him anxious and he started have some chest tightness given some Carafate and a GI cocktail and that helped him improved drastically.  And then he saw that his blood counts came back to normal.  He says he was looking up and thought that he had leukemia he says he is having some trouble with sleeping like something to help with sleeping and would also like to quit smoking.  Relevant past medical, surgical, family and social history reviewed and updated as indicated. Interim medical history since our last visit reviewed. Allergies and medications reviewed and updated.  Review of Systems  Constitutional:  Negative for chills and fever.  Eyes:  Negative for visual disturbance.  Respiratory:  Negative for shortness of breath and wheezing.   Cardiovascular:  Negative for chest pain and leg swelling.  Musculoskeletal:  Negative for back pain and gait problem.  Skin:  Negative for rash.  Neurological:  Negative for dizziness, weakness and light-headedness.  All other systems reviewed and are negative.  Per HPI unless specifically indicated above   Allergies as of 07/21/2020       Reactions   Lipitor [atorvastatin] Hives   Lopressor [metoprolol Tartrate] Shortness Of Breath   Felt like he was going to die , could not breath & syncope   Eggs Or Egg-derived Products Other (See Comments)   Really bad stomach pain   Lactose Intolerance (gi) Diarrhea        Medication List        Accurate as of July 21, 2020  8:45 AM. If you have any questions, ask your nurse or doctor.          clopidogrel 75 MG tablet Commonly known as: PLAVIX Take 1 tablet (75 mg total) by mouth daily.   CoQ10 100 MG Caps Take 1 tablet by mouth daily.   L-Arginine 1000 MG Tabs Take 1 tablet by mouth daily.   lisinopril 40 MG tablet Commonly known as: ZESTRIL Take 0.5 tablets (20 mg total) by mouth 2 (two) times daily.   multivitamin tablet Take 1 tablet by mouth daily.   pantoprazole 20 MG tablet Commonly known as: Protonix Take 1 tablet (20 mg total) by mouth daily.   rosuvastatin 20 MG tablet Commonly known as: CRESTOR Take 1 tablet (20 mg total) by mouth daily.   sucralfate 1 g tablet Commonly known as: Carafate Take 1 tablet (1 g total) by mouth 4 (four) times daily.         Objective:   Pulse 64   Ht 5' 11.8" (1.824 m)   Wt 279 lb (126.6 kg)   SpO2 100%   BMI 38.05 kg/m   Wt Readings from Last 3 Encounters:  07/21/20 279 lb (126.6 kg)  07/13/20 281 lb (127.5 kg)  07/12/20 281 lb (127.5 kg)    Physical Exam Vitals and nursing note  reviewed.  Constitutional:      General: He is not in acute distress.    Appearance: He is well-developed. He is not diaphoretic.  Eyes:     General: No scleral icterus.    Conjunctiva/sclera: Conjunctivae normal.  Neck:     Thyroid: No thyromegaly.  Cardiovascular:     Rate and Rhythm: Normal rate and regular rhythm.     Heart sounds: Normal heart sounds. No murmur heard. Pulmonary:     Effort: Pulmonary effort is normal. No respiratory distress.     Breath sounds: Normal breath sounds. No wheezing.  Abdominal:     General: Abdomen is flat. Bowel sounds are normal. There is no distension.     Tenderness: There is no abdominal tenderness. There is no guarding or rebound.  Musculoskeletal:        General: Normal range of motion.  Skin:    General: Skin is warm and dry.     Findings: No rash.  Neurological:     Mental Status: He is  alert and oriented to person, place, and time.     Coordination: Coordination normal.  Psychiatric:        Behavior: Behavior normal.    Results for orders placed or performed during the hospital encounter of 75/88/32  Basic metabolic panel  Result Value Ref Range   Sodium 139 135 - 145 mmol/L   Potassium 4.1 3.5 - 5.1 mmol/L   Chloride 106 98 - 111 mmol/L   CO2 24 22 - 32 mmol/L   Glucose, Bld 94 70 - 99 mg/dL   BUN 10 6 - 20 mg/dL   Creatinine, Ser 0.88 0.61 - 1.24 mg/dL   Calcium 9.8 8.9 - 10.3 mg/dL   GFR, Estimated >60 >60 mL/min   Anion gap 9 5 - 15  CBC  Result Value Ref Range   WBC 7.2 4.0 - 10.5 K/uL   RBC 5.28 4.22 - 5.81 MIL/uL   Hemoglobin 15.6 13.0 - 17.0 g/dL   HCT 46.9 39.0 - 52.0 %   MCV 88.8 80.0 - 100.0 fL   MCH 29.5 26.0 - 34.0 pg   MCHC 33.3 30.0 - 36.0 g/dL   RDW 12.2 11.5 - 15.5 %   Platelets 199 150 - 400 K/uL   nRBC 0.0 0.0 - 0.2 %  Troponin I (High Sensitivity)  Result Value Ref Range   Troponin I (High Sensitivity) 2 <18 ng/L  Troponin I (High Sensitivity)  Result Value Ref Range   Troponin I (High Sensitivity) 2 <18 ng/L    Assessment & Plan:   Problem List Items Addressed This Visit       Digestive   GERD (gastroesophageal reflux disease)   Relevant Orders   CBC with Differential/Platelet   CMP14+EGFR   Other Visit Diagnoses     Anxiety attack    -  Primary   Relevant Medications   traZODone (DESYREL) 50 MG tablet   Other Relevant Orders   CMP14+EGFR   Encounter for smoking cessation counseling       Relevant Medications   varenicline (CHANTIX STARTING MONTH PAK) 0.5 MG X 11 & 1 MG X 42 tablet   varenicline (CHANTIX CONTINUING MONTH PAK) 1 MG tablet     Will start trazodone to help with sleep as needed and patient will do Chantix.  He says it was just an anxiety attack that he had because of some blood draws but then they came back normal, we will recheck those today.  Follow  up plan: Return in about 6 months (around  01/21/2021), or if symptoms worsen or fail to improve, for anxiety and gerd.  Counseling provided for all of the vaccine components No orders of the defined types were placed in this encounter.   Caryl Pina, MD [facility] 07/21/2020, 8:45 AM

## 2020-08-25 ENCOUNTER — Other Ambulatory Visit: Payer: Self-pay | Admitting: Family Medicine

## 2020-08-25 DIAGNOSIS — I10 Essential (primary) hypertension: Secondary | ICD-10-CM

## 2020-08-26 ENCOUNTER — Telehealth: Payer: Self-pay | Admitting: Family Medicine

## 2020-08-26 NOTE — Telephone Encounter (Signed)
Pt said that his lisinopril was called in to [facility] but he said that he had recently talked to [facility] and he was supposed to increase his dosage from 1/2 pill in the morning and night to 1 pill in the morning and at night. When he picked up the prescription it was only enough for him to take 1/2 a pill and the instructions still stated to take 1/2 pill in the morning and night.

## 2020-08-26 NOTE — Telephone Encounter (Signed)
Refill request w/ copied note from pharmacy was sent yesterday, pt was given #6 from pharmacy to get him through the weekend Please advise on increase

## 2020-08-29 ENCOUNTER — Other Ambulatory Visit: Payer: Self-pay | Admitting: Family Medicine

## 2020-08-29 DIAGNOSIS — I10 Essential (primary) hypertension: Secondary | ICD-10-CM

## 2020-08-29 MED ORDER — LISINOPRIL 40 MG PO TABS: 40.0000 mg | ORAL_TABLET | Freq: Two times a day (BID) | ORAL | 1 refills | Status: DC

## 2020-08-29 NOTE — Progress Notes (Unsigned)
Have him try the higher dose, if he has any issues with that then let me know, any lightheadedness or dizziness or feeling faint please let me know.

## 2020-08-29 NOTE — Progress Notes (Signed)
Patient aware and verbalizes understanding. 

## 2020-08-29 NOTE — Progress Notes (Signed)
Attempted to contact patient - NA °

## 2020-12-30 ENCOUNTER — Telehealth: Payer: Self-pay | Admitting: Family Medicine

## 2020-12-30 ENCOUNTER — Other Ambulatory Visit: Payer: Self-pay

## 2020-12-30 ENCOUNTER — Other Ambulatory Visit: Payer: Self-pay | Admitting: Family Medicine

## 2020-12-30 MED ORDER — METFORMIN HCL 500 MG PO TABS: 500.0000 mg | ORAL_TABLET | Freq: Two times a day (BID) | ORAL | 0 refills | Status: DC

## 2020-12-30 NOTE — Telephone Encounter (Signed)
One month supply of Metformin sent to [facility]. Tried calling pt. No answer and no vmail.

## 2020-12-30 NOTE — Telephone Encounter (Signed)
Not on current med list.  Prescribed on 05/01/2018, #180, 3 refills but not since then. Last A1C was 06/20/20, 6.0.

## 2020-12-30 NOTE — Telephone Encounter (Signed)
Go ahead and send in a prescription for metformin 500 twice daily and give him a months worth and we will see him in January.

## 2020-12-30 NOTE — Telephone Encounter (Signed)
°  Prescription Request  12/30/2020  Is this a "Controlled Substance" medicine? no  Have you seen your PCP in the last 2 weeks? No, has appt 01/23/21  If YES, route message to pool  -  If NO, patient needs to be scheduled for appointment.  What is the name of the medication or equipment? Metformin   Have you contacted your pharmacy to request a refill? yes   Which pharmacy would you like this sent to? [facility]    Patient notified that their request is being sent to the clinical staff for review and that they should receive a response within 2 business days.

## 2021-01-23 ENCOUNTER — Encounter: Payer: Self-pay | Admitting: Family Medicine

## 2021-01-23 ENCOUNTER — Ambulatory Visit (INDEPENDENT_AMBULATORY_CARE_PROVIDER_SITE_OTHER): Payer: Self-pay | Admitting: Family Medicine

## 2021-01-23 DIAGNOSIS — E7800 Pure hypercholesterolemia, unspecified: Secondary | ICD-10-CM

## 2021-01-23 DIAGNOSIS — I10 Essential (primary) hypertension: Secondary | ICD-10-CM

## 2021-01-23 DIAGNOSIS — E785 Hyperlipidemia, unspecified: Secondary | ICD-10-CM

## 2021-01-23 DIAGNOSIS — Z716 Tobacco abuse counseling: Secondary | ICD-10-CM

## 2021-01-23 DIAGNOSIS — R7303 Prediabetes: Secondary | ICD-10-CM

## 2021-01-23 MED ORDER — LISINOPRIL 40 MG PO TABS: 40.0000 mg | ORAL_TABLET | Freq: Two times a day (BID) | ORAL | 1 refills | Status: DC

## 2021-01-23 MED ORDER — METFORMIN HCL 500 MG PO TABS: 500.0000 mg | ORAL_TABLET | Freq: Two times a day (BID) | ORAL | 3 refills | Status: DC

## 2021-01-23 MED ORDER — BUPROPION HCL ER (XL) 150 MG PO TB24: 150.0000 mg | ORAL_TABLET | Freq: Every day | ORAL | 1 refills | Status: DC

## 2021-01-23 MED ORDER — TRAZODONE HCL 50 MG PO TABS: 50.0000 mg | ORAL_TABLET | Freq: Every evening | ORAL | 2 refills | Status: DC | PRN

## 2021-01-23 MED ORDER — CLOPIDOGREL BISULFATE 75 MG PO TABS: 75.0000 mg | ORAL_TABLET | Freq: Every day | ORAL | 3 refills | Status: DC

## 2021-01-23 MED ORDER — CEPHALEXIN 500 MG PO CAPS: 500.0000 mg | ORAL_CAPSULE | Freq: Four times a day (QID) | ORAL | 0 refills | Status: DC

## 2021-01-23 NOTE — Progress Notes (Signed)
Virtual Visit via telephone Note  I connected with Jason Morris on 01/23/21 at 0850 by telephone and verified that I am speaking with the correct person using two identifiers. Jason Morris is currently located at home and patient are currently with her during visit. The provider, Fransisca Kaufmann Winslow Verrill, MD is located in their office at time of visit.  Call ended at 0910  I discussed the limitations, risks, security and privacy concerns of performing an evaluation and management service by telephone and the availability of in person appointments. I also discussed with the patient that there may be a patient responsible charge related to this service. The patient expressed understanding and agreed to proceed.   History and Present Illness: Type 2 diabetes mellitus Patient comes in today for recheck of his diabetes. Patient has been currently taking no medicine and he started noticing his blood sugar elevated over 200 and headaches. Patient is currently on an ACE inhibitor/ARB. Patient has not seen an ophthalmologist this year. Patient denies any issues with their feet. The symptom started onset as an adult htn and hld ARE RELATED TO DM   Hypertension Patient is currently on lisinopril, and their blood pressure today is 120/80. Patient denies any lightheadedness or dizziness. Patient denies headaches, blurred vision, chest pains, shortness of breath, or weakness. Denies any side effects from medication and is content with current medication.   Hyperlipidemia Patient is coming in for recheck of his hyperlipidemia. The patient is currently taking Crestor. They deny any issues with myalgias or history of liver damage from it. They deny any focal numbness or weakness or chest pain.   He has used trazodone for sleep for anxiety and it helps some but is   He never had chantix because of insurance and cost.     Outpatient Encounter Medications as of 01/23/2021  Medication Sig   buPROPion  (WELLBUTRIN XL) 150 MG 24 hr tablet Take 1 tablet (150 mg total) by mouth daily.   cephALEXin (KEFLEX) 500 MG capsule Take 1 capsule (500 mg total) by mouth 4 (four) times daily.   clopidogrel (PLAVIX) 75 MG tablet Take 1 tablet (75 mg total) by mouth daily.   Coenzyme Q10 (COQ10) 100 MG CAPS Take 1 tablet by mouth daily.   L-Arginine 1000 MG TABS Take 1 tablet by mouth daily.   lisinopril (ZESTRIL) 40 MG tablet Take 1 tablet (40 mg total) by mouth 2 (two) times daily.   metFORMIN (GLUCOPHAGE) 500 MG tablet Take 1 tablet (500 mg total) by mouth 2 (two) times daily with a meal.   Multiple Vitamin (MULTIVITAMIN) tablet Take 1 tablet by mouth daily.   pantoprazole (PROTONIX) 20 MG tablet Take 1 tablet (20 mg total) by mouth daily.   rosuvastatin (CRESTOR) 20 MG tablet Take 1 tablet (20 mg total) by mouth daily.   sucralfate (CARAFATE) 1 g tablet Take 1 tablet (1 g total) by mouth 4 (four) times daily.   traZODone (DESYREL) 50 MG tablet Take 1-1.5 tablets (50-75 mg total) by mouth at bedtime as needed for sleep.   [DISCONTINUED] atorvastatin (LIPITOR) 80 MG tablet Take 1 tablet (80 mg total) by mouth daily at 6 PM.   [DISCONTINUED] clopidogrel (PLAVIX) 75 MG tablet Take 1 tablet (75 mg total) by mouth daily.   [DISCONTINUED] lisinopril (ZESTRIL) 40 MG tablet Take 1 tablet (40 mg total) by mouth 2 (two) times daily.   [DISCONTINUED] metFORMIN (GLUCOPHAGE) 500 MG tablet Take 1 tablet (500 mg total) by mouth 2 (  two) times daily with a meal.   [DISCONTINUED] traZODone (DESYREL) 50 MG tablet Take 0.5-1 tablets (25-50 mg total) by mouth at bedtime as needed for sleep.   [DISCONTINUED] varenicline (CHANTIX CONTINUING MONTH PAK) 1 MG tablet Take 1 tablet (1 mg total) by mouth 2 (two) times daily.   [DISCONTINUED] varenicline (CHANTIX STARTING MONTH PAK) 0.5 MG X 11 & 1 MG X 42 tablet Take one 0.5 mg tablet by mouth daily for 3 days, then one 0.5 mg tablet twice daily for 4 days, then one 1 mg tablet twice  daily.   No facility-administered encounter medications on file as of 01/23/2021.    Review of Systems  Constitutional:  Negative for chills and fever.  Eyes:  Negative for visual disturbance.  Respiratory:  Negative for shortness of breath and wheezing.   Cardiovascular:  Negative for chest pain and leg swelling.  Musculoskeletal:  Negative for back pain and gait problem.  Skin:  Negative for rash.  Neurological:  Negative for dizziness, weakness and light-headedness.  All other systems reviewed and are negative.  Observations/Objective: Patient sounds comfortable and in no acute distress  Assessment and Plan: Problem List Items Addressed This Visit       Cardiovascular and Mediastinum   Essential hypertension - Primary   Relevant Medications   lisinopril (ZESTRIL) 40 MG tablet   clopidogrel (PLAVIX) 75 MG tablet   Other Relevant Orders   Bayer DCA Hb A1c Waived   CBC with Differential/Platelet     Other   Dyslipidemia   Relevant Orders   Lipid panel   Prediabetes   Relevant Medications   metFORMIN (GLUCOPHAGE) 500 MG tablet   clopidogrel (PLAVIX) 75 MG tablet   Other Relevant Orders   Bayer DCA Hb A1c Waived   CBC with Differential/Platelet   Hypercholesterolemia   Relevant Medications   lisinopril (ZESTRIL) 40 MG tablet   clopidogrel (PLAVIX) 75 MG tablet   Other Relevant Orders   CMP14+EGFR   Other Visit Diagnoses     Encounter for smoking cessation counseling       Relevant Medications   buPROPion (WELLBUTRIN XL) 150 MG 24 hr tablet       Recheck of chronic medical issues, hemoglobin and this was tomorrow for recent sugars rapidly restarted metformin and since doing better.  [place] blood pressures are running good. Follow up plan: Return in about 6 months (around 07/23/2021), or if symptoms worsen or fail to improve, for prediabetes and htn and hld.     I discussed the assessment and treatment plan with the patient. The patient was provided an  opportunity to ask questions and all were answered. The patient agreed with the plan and demonstrated an understanding of the instructions.   The patient was advised to call back or seek an in-person evaluation if the symptoms worsen or if the condition fails to improve as anticipated.  The above assessment and management plan was discussed with the patient. The patient verbalized understanding of and has agreed to the management plan. Patient is aware to call the clinic if symptoms persist or worsen. Patient is aware when to return to the clinic for a follow-up visit. Patient educated on when it is appropriate to go to the emergency department.    I provided 20 minutes of non-face-to-face time during this encounter.    Worthy Rancher, MD

## 2021-01-24 ENCOUNTER — Other Ambulatory Visit: Payer: Self-pay

## 2021-01-24 DIAGNOSIS — E7800 Pure hypercholesterolemia, unspecified: Secondary | ICD-10-CM

## 2021-01-24 DIAGNOSIS — R7303 Prediabetes: Secondary | ICD-10-CM

## 2021-01-24 DIAGNOSIS — E785 Hyperlipidemia, unspecified: Secondary | ICD-10-CM

## 2021-01-24 DIAGNOSIS — I10 Essential (primary) hypertension: Secondary | ICD-10-CM

## 2021-01-24 LAB — BAYER DCA HB A1C WAIVED: HB A1C (BAYER DCA - WAIVED): 6.3 % — ABNORMAL HIGH (ref 4.8–5.6)

## 2021-01-25 ENCOUNTER — Encounter: Payer: Self-pay | Admitting: Family Medicine

## 2021-01-25 LAB — CBC WITH DIFFERENTIAL/PLATELET
Basophils Absolute: 0 10*3/uL (ref 0.0–0.2)
Basos: 1 %
EOS (ABSOLUTE): 0.1 10*3/uL (ref 0.0–0.4)
Eos: 2 %
Hematocrit: 46.8 % (ref 37.5–51.0)
Hemoglobin: 15.9 g/dL (ref 13.0–17.7)
Immature Grans (Abs): 0.1 10*3/uL (ref 0.0–0.1)
Immature Granulocytes: 1 %
Lymphocytes Absolute: 2.3 10*3/uL (ref 0.7–3.1)
Lymphs: 38 %
MCH: 30 pg (ref 26.6–33.0)
MCHC: 34 g/dL (ref 31.5–35.7)
MCV: 88 fL (ref 79–97)
Monocytes Absolute: 0.6 10*3/uL (ref 0.1–0.9)
Monocytes: 10 %
Neutrophils Absolute: 2.9 10*3/uL (ref 1.4–7.0)
Neutrophils: 48 %
Platelets: 208 10*3/uL (ref 150–450)
RBC: 5.3 x10E6/uL (ref 4.14–5.80)
RDW: 11.9 % (ref 11.6–15.4)
WBC: 6 10*3/uL (ref 3.4–10.8)

## 2021-01-25 LAB — CMP14+EGFR
ALT: 67 IU/L — ABNORMAL HIGH (ref 0–44)
AST: 35 IU/L (ref 0–40)
Albumin/Globulin Ratio: 1.8 (ref 1.2–2.2)
Albumin: 4.4 g/dL (ref 4.0–5.0)
Alkaline Phosphatase: 58 IU/L (ref 44–121)
BUN/Creatinine Ratio: 7 — ABNORMAL LOW (ref 9–20)
BUN: 7 mg/dL (ref 6–24)
Bilirubin Total: 0.3 mg/dL (ref 0.0–1.2)
CO2: 21 mmol/L (ref 20–29)
Calcium: 9.2 mg/dL (ref 8.7–10.2)
Chloride: 104 mmol/L (ref 96–106)
Creatinine, Ser: 0.98 mg/dL (ref 0.76–1.27)
Globulin, Total: 2.4 g/dL (ref 1.5–4.5)
Glucose: 118 mg/dL — ABNORMAL HIGH (ref 70–99)
Potassium: 4.9 mmol/L (ref 3.5–5.2)
Sodium: 138 mmol/L (ref 134–144)
Total Protein: 6.8 g/dL (ref 6.0–8.5)
eGFR: 98 mL/min/{1.73_m2} (ref >59)

## 2021-01-25 LAB — LIPID PANEL
Chol/HDL Ratio: 6.7 ratio — ABNORMAL HIGH (ref 0.0–5.0)
Cholesterol, Total: 147 mg/dL (ref 100–199)
HDL: 22 mg/dL — ABNORMAL LOW (ref >39)
LDL Chol Calc (NIH): 102 mg/dL — ABNORMAL HIGH (ref 0–99)
Triglycerides: 125 mg/dL (ref 0–149)
VLDL Cholesterol Cal: 23 mg/dL (ref 5–40)

## 2021-08-01 ENCOUNTER — Other Ambulatory Visit: Payer: Self-pay | Admitting: Family Medicine

## 2021-08-01 DIAGNOSIS — E7800 Pure hypercholesterolemia, unspecified: Secondary | ICD-10-CM

## 2021-08-01 DIAGNOSIS — I10 Essential (primary) hypertension: Secondary | ICD-10-CM

## 2021-08-01 DIAGNOSIS — Z716 Tobacco abuse counseling: Secondary | ICD-10-CM

## 2021-10-24 ENCOUNTER — Other Ambulatory Visit: Payer: Self-pay | Admitting: Family Medicine

## 2021-10-24 DIAGNOSIS — I10 Essential (primary) hypertension: Secondary | ICD-10-CM

## 2021-11-06 ENCOUNTER — Telehealth: Payer: Self-pay | Admitting: Family Medicine

## 2021-11-06 NOTE — Telephone Encounter (Signed)
Patient is having issues with pain in mouth due to having teeth pulled today. Was told by dentist to call PCP due to other mediations that he is on to advise him on what to take for the pain. Please call back and advise.

## 2021-11-06 NOTE — Telephone Encounter (Signed)
Patient aware and verbalized understanding. °

## 2021-11-06 NOTE — Telephone Encounter (Signed)
Due to him being on a blood thinner he will have to stick with Tylenol, I would not take ibuprofen Advil or Aleve because they are within his blood more.  He will have to stick with the Tylenol

## 2021-12-12 ENCOUNTER — Ambulatory Visit (INDEPENDENT_AMBULATORY_CARE_PROVIDER_SITE_OTHER): Payer: Self-pay | Admitting: Nurse Practitioner

## 2021-12-12 ENCOUNTER — Encounter: Payer: Self-pay | Admitting: Nurse Practitioner

## 2021-12-12 VITALS — BP 129/82 | HR 89 | Temp 97.2°F | Resp 20 | Ht 71.0 in | Wt 295.0 lb

## 2021-12-12 DIAGNOSIS — K2100 Gastro-esophageal reflux disease with esophagitis, without bleeding: Secondary | ICD-10-CM

## 2021-12-12 DIAGNOSIS — F411 Generalized anxiety disorder: Secondary | ICD-10-CM

## 2021-12-12 DIAGNOSIS — Z720 Tobacco use: Secondary | ICD-10-CM

## 2021-12-12 DIAGNOSIS — E7800 Pure hypercholesterolemia, unspecified: Secondary | ICD-10-CM

## 2021-12-12 DIAGNOSIS — I2510 Atherosclerotic heart disease of native coronary artery without angina pectoris: Secondary | ICD-10-CM

## 2021-12-12 DIAGNOSIS — G4700 Insomnia, unspecified: Secondary | ICD-10-CM

## 2021-12-12 DIAGNOSIS — J40 Bronchitis, not specified as acute or chronic: Secondary | ICD-10-CM

## 2021-12-12 DIAGNOSIS — E66811 Obesity, class 1: Secondary | ICD-10-CM | POA: Insufficient documentation

## 2021-12-12 DIAGNOSIS — I10 Essential (primary) hypertension: Secondary | ICD-10-CM

## 2021-12-12 DIAGNOSIS — R7303 Prediabetes: Secondary | ICD-10-CM

## 2021-12-12 LAB — BAYER DCA HB A1C WAIVED: HB A1C (BAYER DCA - WAIVED): 6.8 % — ABNORMAL HIGH (ref 4.8–5.6)

## 2021-12-12 MED ORDER — PREDNISONE 20 MG PO TABS: 40.0000 mg | ORAL_TABLET | Freq: Every day | ORAL | 0 refills | Status: AC

## 2021-12-12 MED ORDER — CLOPIDOGREL BISULFATE 75 MG PO TABS: 75.0000 mg | ORAL_TABLET | Freq: Every day | ORAL | 1 refills | Status: DC

## 2021-12-12 MED ORDER — ROSUVASTATIN CALCIUM 20 MG PO TABS: 20.0000 mg | ORAL_TABLET | Freq: Every day | ORAL | 1 refills | Status: DC

## 2021-12-12 MED ORDER — HYDROCODONE BIT-HOMATROP MBR 5-1.5 MG/5ML PO SOLN: 5.0000 mL | Freq: Four times a day (QID) | ORAL | 0 refills | Status: DC | PRN

## 2021-12-12 MED ORDER — LISINOPRIL 40 MG PO TABS: 40.0000 mg | ORAL_TABLET | Freq: Two times a day (BID) | ORAL | 1 refills | Status: DC

## 2021-12-12 MED ORDER — PANTOPRAZOLE SODIUM 20 MG PO TBEC: 20.0000 mg | DELAYED_RELEASE_TABLET | Freq: Every day | ORAL | 1 refills | Status: DC

## 2021-12-12 MED ORDER — DOXYCYCLINE HYCLATE 100 MG PO TABS: 100.0000 mg | ORAL_TABLET | Freq: Two times a day (BID) | ORAL | 0 refills | Status: DC

## 2021-12-12 NOTE — Progress Notes (Signed)
Subjective:    Patient ID: Jason Morris, male    DOB: [birth year], 46 y.o.   MRN: [id number]   Chief Complaint: Cough and Nasal Congestion  Chronic follow up  HPI:  Jason Morris is a 46 y.o. who identifies as a male who was assigned male at birth.   Social history: Lives with: by himself Work history: self employed   Comes in today for follow up of the following chronic medical issues:  1. Essential hypertension No c/o chest pain, sob or headache. Doe snot check blood pressure at home. BP Readings from Last 3 Encounters:  12/12/21 129/82  07/21/20 135/90  07/13/20 (!) 142/85     2. Coronary artery disease involving native coronary artery of native heart without angina pectoris Has not follow up with cardiology in several years.  3. Hypercholesterolemia Does not really watch diet and does no dedicated exercise Lab Results  Component Value Date   CHOL 147 01/24/2021   HDL 22 (L) 01/24/2021   LDLCALC 102 (H) 01/24/2021   LDLDIRECT 151.2 03/02/2013   TRIG 125 01/24/2021   CHOLHDL 6.7 (H) 01/24/2021     4. Gastroesophageal reflux disease with esophagitis without hemorrhage Is on protonix daily and is doing well.  5. Prediabetes He does not check his blood sugars at home. He is on metformin BID Lab Results  Component Value Date   HGBA1C 6.3 (H) 01/24/2021     6. GAD (generalized anxiety disorder) Is currently on no prescription meds for anxiety.    12/12/2021   11:53 AM  GAD 7 : Generalized Anxiety Score  Nervous, Anxious, on Edge 0  Control/stop worrying 0  Worry too much - different things 0  Trouble relaxing 0  Restless 0  Easily annoyed or irritable 0  Afraid - awful might happen 0  Total GAD 7 Score 0  Anxiety Difficulty Not difficult at all      7. Insomnia, unspecified type Is on trazadone  to sleep at night. He says the trazadone doe  snot help him sleep.  8. Tobacco abuse Smokes over a pack a day.  9. BMI 38.0-38.9,adult Weight is  up 16lbs Wt Readings from Last 3 Encounters:  12/12/21 295 lb (133.8 kg)  07/21/20 279 lb (126.6 kg)  07/13/20 281 lb (127.5 kg)   BMI Readings from Last 3 Encounters:  12/12/21 41.14 kg/m  07/21/20 38.05 kg/m  07/13/20 38.32 kg/m      New complaints: Cough and congestion- started about 1 week. No better. Fever last night Deep wet cough with congestion. Chest hurts when he coughs.  Allergies  Allergen Reactions   Lipitor [Atorvastatin] Hives   Lopressor [Metoprolol Tartrate] Shortness Of Breath    Felt like he was going to die , could not breath & syncope   Eggs Or Egg-Derived Products Other (See Comments)    Really bad stomach pain   Lactose Intolerance (Gi) Diarrhea   Outpatient Encounter Medications as of 12/12/2021  Medication Sig   clopidogrel (PLAVIX) 75 MG tablet Take 1 tablet (75 mg total) by mouth daily.   L-Arginine 1000 MG TABS Take 1 tablet by mouth daily.   lisinopril (ZESTRIL) 40 MG tablet Take 1 tablet (40 mg total) by mouth 2 (two) times daily. (NEEDS TO BE SEEN BEFORE NEXT REFILL)   metFORMIN (GLUCOPHAGE) 500 MG tablet Take 1 tablet (500 mg total) by mouth 2 (two) times daily with a meal.   Multiple Vitamin (MULTIVITAMIN) tablet Take 1 tablet by mouth daily.  pantoprazole (PROTONIX) 20 MG tablet Take 1 tablet (20 mg total) by mouth daily.   rosuvastatin (CRESTOR) 20 MG tablet TAKE ONE TABLET ONCE DAILY   traZODone (DESYREL) 50 MG tablet Take 1-1.5 tablets (50-75 mg total) by mouth at bedtime as needed for sleep.   [DISCONTINUED] atorvastatin (LIPITOR) 80 MG tablet Take 1 tablet (80 mg total) by mouth daily at 6 PM.   [DISCONTINUED] buPROPion (WELLBUTRIN XL) 150 MG 24 hr tablet TAKE 1 TABLET DAILY   [DISCONTINUED] cephALEXin (KEFLEX) 500 MG capsule Take 1 capsule (500 mg total) by mouth 4 (four) times daily.   [DISCONTINUED] Coenzyme Q10 (COQ10) 100 MG CAPS Take 1 tablet by mouth daily.   [DISCONTINUED] sucralfate (CARAFATE) 1 g tablet Take 1 tablet (1 g  total) by mouth 4 (four) times daily.   No facility-administered encounter medications on file as of 12/12/2021.    Past Surgical History:  Procedure Laterality Date   LEFT HEART CATH N/A 02/21/2014   Procedure: LEFT HEART CATH;  Surgeon: Burnell Blanks, MD;  Location: [facility] CATH LAB;  Service: Cardiovascular;  Laterality: N/A;   MYRINGOTOMY WITH TUBE PLACEMENT Bilateral    "4-5 times as a child" (08/09/2012)    Family History  Problem Relation Age of Onset   Heart attack Father 39   Heart attack Paternal Grandfather 74   Heart attack Paternal Uncle 81      Controlled substance contract: n/a     Review of Systems  Constitutional:  Positive for fever (?). Negative for diaphoresis.  HENT:  Positive for congestion. Negative for sinus pressure and sore throat.   Eyes:  Negative for pain.  Respiratory:  Positive for cough. Negative for shortness of breath and wheezing.   Cardiovascular:  Negative for chest pain, palpitations and leg swelling.  Gastrointestinal:  Negative for abdominal pain.  Endocrine: Negative for polydipsia.  Skin:  Negative for rash.  Neurological:  Negative for dizziness, weakness and headaches.  Hematological:  Does not bruise/bleed easily.  All other systems reviewed and are negative.      Objective:   Physical Exam Vitals and nursing note reviewed.  Constitutional:      Appearance: Normal appearance. He is well-developed.  HENT:     Head: Normocephalic.     Nose: Congestion and rhinorrhea present.     Mouth/Throat:     Mouth: Mucous membranes are moist.     Pharynx: Oropharynx is clear.  Eyes:     Pupils: Pupils are equal, round, and reactive to light.  Neck:     Thyroid: No thyroid mass or thyromegaly.     Vascular: No carotid bruit or JVD.     Trachea: Phonation normal.  Cardiovascular:     Rate and Rhythm: Normal rate and regular rhythm.  Pulmonary:     Effort: Pulmonary effort is normal. No respiratory distress.     Breath sounds:  Wheezing present.     Comments: Deep cough Abdominal:     General: Bowel sounds are normal.     Palpations: Abdomen is soft.     Tenderness: There is no abdominal tenderness.  Musculoskeletal:        General: Normal range of motion.     Cervical back: Normal range of motion and neck supple.  Lymphadenopathy:     Cervical: No cervical adenopathy.  Skin:    General: Skin is warm and dry.  Neurological:     Mental Status: He is alert and oriented to person, place, and time.  Psychiatric:  Behavior: Behavior normal.        Thought Content: Thought content normal.        Judgment: Judgment normal.    BP 129/82   Pulse 89   Temp (!) 97.2 F (36.2 C) (Temporal)   Resp 20   Ht _0  (1.803 m)   Wt 295 lb (133.8 kg)   SpO2 98%   BMI 41.14 kg/m   HGBA1c 6.8%      Assessment & Plan:  KENRY DAUBERT comes in today with chief complaint of Cough and Nasal Congestion   Diagnosis and orders addressed:  1. Essential hypertension Low sodium diet - CMP14+EGFR - lisinopril (ZESTRIL) 40 MG tablet; Take 1 tablet (40 mg total) by mouth 2 (two) times daily. (NEEDS TO BE SEEN BEFORE NEXT REFILL)  Dispense: 180 tablet; Refill: 1 - clopidogrel (PLAVIX) 75 MG tablet; Take 1 tablet (75 mg total) by mouth daily.  Dispense: 90 tablet; Refill: 1  2. Coronary artery disease involving native coronary artery of native heart without angina pectoris Needs to follow up with cardiology  3. Hypercholesterolemia Low fat diet - Lipid panel - clopidogrel (PLAVIX) 75 MG tablet; Take 1 tablet (75 mg total) by mouth daily.  Dispense: 90 tablet; Refill: 1 - rosuvastatin (CRESTOR) 20 MG tablet; Take 1 tablet (20 mg total) by mouth daily.  Dispense: 90 tablet; Refill: 1  4. Gastroesophageal reflux disease with esophagitis without hemorrhage Avoid spicy foods Do not eat 2 hours prior to bedtime - pantoprazole (PROTONIX) 20 MG tablet; Take 1 tablet (20 mg total) by mouth daily.  Dispense: 90 tablet;  Refill: 1  5. Prediabetes Continue to watch carbs in diet - Bayer DCA Hb A1c Waived - clopidogrel (PLAVIX) 75 MG tablet; Take 1 tablet (75 mg total) by mouth daily.  Dispense: 90 tablet; Refill: 1  6. GAD (generalized anxiety disorder) Stress management  7. Insomnia, unspecified type Will have to follow up with PCP   8. Tobacco abuse Smoking cessation encouraged  9. Morbid obesity ([place]) Discussed diet and exercise for person with BMI >25 Will recheck weight in 3-6 months  10. Bronchitis 1. Take meds as prescribed 2. Use a cool mist humidifier especially during the winter months and when heat has been humid. 3. Use saline nose sprays frequently 4. Saline irrigations of the nose can be very helpful if done frequently.  * 4X daily for 1 week*  * Use of a nettie pot can be helpful with this. Follow directions with this* 5. Drink plenty of fluids 6. Keep thermostat turn down low 7.For any cough or congestion- hycodan 8. For fever or aces or pains- take tylenol or ibuprofen appropriate for age and weight.  * for fevers greater than 101 orally you may alternate ibuprofen and tylenol every  3 hours.    - doxycycline (VIBRA-TABS) 100 MG tablet; Take 1 tablet (100 mg total) by mouth 2 (two) times daily. 1 po bid  Dispense: 20 tablet; Refill: 0 - predniSONE (DELTASONE) 20 MG tablet; Take 2 tablets (40 mg total) by mouth daily with breakfast for 5 days. 2 po daily for 5 days  Dispense: 10 tablet; Refill: 0 - HYDROcodone bit-homatropine (HYCODAN) 5-1.5 MG/5ML syrup; Take 5 mLs by mouth every 6 (six) hours as needed for cough.  Dispense: 120 mL; Refill: 0   Labs pending Health Maintenance reviewed Diet and exercise encouraged  Follow up plan: 6 months   Piatt, FNP

## 2021-12-12 NOTE — Patient Instructions (Signed)
1. Take meds as prescribed 2. Use a cool mist humidifier especially during the winter months and when heat has been humid. 3. Use saline nose sprays frequently 4. Saline irrigations of the nose can be very helpful if done frequently.  * 4X daily for 1 week*  * Use of a nettie pot can be helpful with this. Follow directions with this* 5. Drink plenty of fluids 6. Keep thermostat turn down low 7.For any cough or congestion- hycodan 8. For fever or aces or pains- take tylenol or ibuprofen appropriate for age and weight.  * for fevers greater than 101 orally you may alternate ibuprofen and tylenol every  3 hours.    

## 2021-12-13 LAB — CMP14+EGFR
ALT: 42 IU/L (ref 0–44)
AST: 30 IU/L (ref 0–40)
Albumin/Globulin Ratio: 1.7 (ref 1.2–2.2)
Albumin: 4.3 g/dL (ref 4.1–5.1)
Alkaline Phosphatase: 65 IU/L (ref 44–121)
BUN/Creatinine Ratio: 10 (ref 9–20)
BUN: 10 mg/dL (ref 6–24)
Bilirubin Total: 0.4 mg/dL (ref 0.0–1.2)
CO2: 21 mmol/L (ref 20–29)
Calcium: 9.6 mg/dL (ref 8.7–10.2)
Chloride: 99 mmol/L (ref 96–106)
Creatinine, Ser: 0.99 mg/dL (ref 0.76–1.27)
Globulin, Total: 2.5 g/dL (ref 1.5–4.5)
Glucose: 164 mg/dL — ABNORMAL HIGH (ref 70–99)
Potassium: 4.3 mmol/L (ref 3.5–5.2)
Sodium: 135 mmol/L (ref 134–144)
Total Protein: 6.8 g/dL (ref 6.0–8.5)
eGFR: 96 mL/min/{1.73_m2} (ref >59)

## 2021-12-13 LAB — LIPID PANEL
Chol/HDL Ratio: 7.6 ratio — ABNORMAL HIGH (ref 0.0–5.0)
Cholesterol, Total: 168 mg/dL (ref 100–199)
HDL: 22 mg/dL — ABNORMAL LOW (ref >39)
LDL Chol Calc (NIH): 100 mg/dL — ABNORMAL HIGH (ref 0–99)
Triglycerides: 271 mg/dL — ABNORMAL HIGH (ref 0–149)
VLDL Cholesterol Cal: 46 mg/dL — ABNORMAL HIGH (ref 5–40)

## 2022-01-24 ENCOUNTER — Other Ambulatory Visit: Payer: Self-pay | Admitting: Family Medicine

## 2022-01-24 ENCOUNTER — Telehealth: Payer: Self-pay | Admitting: Family Medicine

## 2022-01-24 ENCOUNTER — Other Ambulatory Visit: Payer: Self-pay

## 2022-01-24 DIAGNOSIS — R7303 Prediabetes: Secondary | ICD-10-CM

## 2022-01-24 MED ORDER — METFORMIN HCL 500 MG PO TABS: 500.0000 mg | ORAL_TABLET | Freq: Two times a day (BID) | ORAL | 3 refills | Status: DC

## 2022-01-24 NOTE — Telephone Encounter (Signed)
Pt has a apt with pcp 01/18 refill has been sent

## 2022-01-24 NOTE — Telephone Encounter (Signed)
  Prescription Request  01/24/2022  Is this a "Controlled Substance" medicine?   Have you seen your PCP in the last 2 weeks?   If YES, route message to pool  -  If NO, patient needs to be scheduled for appointment.  What is the name of the medication or equipment? metFORMIN (GLUCOPHAGE) 500 MG tablet   Have you contacted your pharmacy to request a refill? yes   Which pharmacy would you like this sent to?  [name], [place]     Patient notified that their request is being sent to the clinical staff for review and that they should receive a response within 2 business days.

## 2022-01-25 ENCOUNTER — Encounter: Payer: Self-pay | Admitting: Family Medicine

## 2022-01-25 ENCOUNTER — Telehealth (INDEPENDENT_AMBULATORY_CARE_PROVIDER_SITE_OTHER): Payer: Self-pay | Admitting: Family Medicine

## 2022-01-25 DIAGNOSIS — R7303 Prediabetes: Secondary | ICD-10-CM

## 2022-01-25 DIAGNOSIS — L03211 Cellulitis of face: Secondary | ICD-10-CM

## 2022-01-25 MED ORDER — AMOXICILLIN-POT CLAVULANATE 875-125 MG PO TABS: 1.0000 | ORAL_TABLET | Freq: Two times a day (BID) | ORAL | 0 refills | Status: DC

## 2022-01-25 NOTE — Progress Notes (Signed)
Virtual Visit via mychart video Note  I connected with Jason Morris on 01/25/22 at 1649 by video and verified that I am speaking with the correct person using two identifiers. Jason Morris is currently located at home and patient are currently with her during visit. The provider, Fransisca Kaufmann Alvita Fana, MD is located in their office at time of visit.  Call ended at 1700  I discussed the limitations, risks, security and privacy concerns of performing an evaluation and management service by video and the availability of in person appointments. I also discussed with the patient that there may be a patient responsible charge related to this service. The patient expressed understanding and agreed to proceed.   History and Present Illness: Patient is calling in for ear pain and congestion and took 3 left augmentin and it helped a little and ciprodex helped a little. He is having swelling in the ear and it started with a pimple just inside his ear.   1. Cellulitis of face   2. Prediabetes     Outpatient Encounter Medications as of 01/25/2022  Medication Sig   amoxicillin-clavulanate (AUGMENTIN) 875-125 MG tablet Take 1 tablet by mouth 2 (two) times daily.   clopidogrel (PLAVIX) 75 MG tablet Take 1 tablet (75 mg total) by mouth daily.   HYDROcodone bit-homatropine (HYCODAN) 5-1.5 MG/5ML syrup Take 5 mLs by mouth every 6 (six) hours as needed for cough.   L-Arginine 1000 MG TABS Take 1 tablet by mouth daily.   lisinopril (ZESTRIL) 40 MG tablet Take 1 tablet (40 mg total) by mouth 2 (two) times daily. (NEEDS TO BE SEEN BEFORE NEXT REFILL)   metFORMIN (GLUCOPHAGE) 500 MG tablet Take 1 tablet (500 mg total) by mouth 2 (two) times daily with a meal.   Multiple Vitamin (MULTIVITAMIN) tablet Take 1 tablet by mouth daily.   pantoprazole (PROTONIX) 20 MG tablet Take 1 tablet (20 mg total) by mouth daily.   rosuvastatin (CRESTOR) 20 MG tablet Take 1 tablet (20 mg total) by mouth daily.   [DISCONTINUED]  atorvastatin (LIPITOR) 80 MG tablet Take 1 tablet (80 mg total) by mouth daily at 6 PM.   [DISCONTINUED] doxycycline (VIBRA-TABS) 100 MG tablet Take 1 tablet (100 mg total) by mouth 2 (two) times daily. 1 po bid   No facility-administered encounter medications on file as of 01/25/2022.    Review of Systems  Constitutional:  Negative for chills and fever.  Respiratory:  Negative for shortness of breath and wheezing.   Cardiovascular:  Negative for chest pain and leg swelling.  Musculoskeletal:  Negative for back pain and gait problem.  Skin:  Positive for color change. Negative for rash and wound.  All other systems reviewed and are negative.   Observations/Objective: Patient sounds comfortable  Assessment and Plan: Problem List Items Addressed This Visit       Other   Prediabetes   Other Visit Diagnoses     Cellulitis of face    -  Primary   Relevant Medications   amoxicillin-clavulanate (AUGMENTIN) 875-125 MG tablet       Will treat like cellulitis.  Follow up plan: Return if symptoms worsen or fail to improve.     I discussed the assessment and treatment plan with the patient. The patient was provided an opportunity to ask questions and all were answered. The patient agreed with the plan and demonstrated an understanding of the instructions.   The patient was advised to call back or seek an in-person evaluation if  the symptoms worsen or if the condition fails to improve as anticipated.  The above assessment and management plan was discussed with the patient. The patient verbalized understanding of and has agreed to the management plan. Patient is aware to call the clinic if symptoms persist or worsen. Patient is aware when to return to the clinic for a follow-up visit. Patient educated on when it is appropriate to go to the emergency department.    I provided 11 minutes of non-face-to-face time during this encounter.    Worthy Rancher, MD

## 2022-02-01 ENCOUNTER — Telehealth: Payer: Self-pay | Admitting: Family Medicine

## 2022-02-02 ENCOUNTER — Telehealth: Payer: Self-pay | Admitting: Family Medicine

## 2022-02-02 MED ORDER — SEMAGLUTIDE(0.25 OR 0.5MG/DOS) 2 MG/3ML ~~LOC~~ SOPN: 0.2500 mg | PEN_INJECTOR | SUBCUTANEOUS | 1 refills | Status: DC

## 2022-02-02 MED ORDER — SEMAGLUTIDE-WEIGHT MANAGEMENT 1 MG/0.5ML ~~LOC~~ SOAJ: 1.0000 mg | SUBCUTANEOUS | 0 refills | Status: DC

## 2022-02-02 MED ORDER — SEMAGLUTIDE-WEIGHT MANAGEMENT 0.5 MG/0.5ML ~~LOC~~ SOAJ: 0.5000 mg | SUBCUTANEOUS | 0 refills | Status: DC

## 2022-02-02 MED ORDER — SEMAGLUTIDE-WEIGHT MANAGEMENT 0.25 MG/0.5ML ~~LOC~~ SOAJ: 0.2500 mg | SUBCUTANEOUS | 0 refills | Status: DC

## 2022-02-02 MED ORDER — SEMAGLUTIDE-WEIGHT MANAGEMENT 1.7 MG/0.75ML ~~LOC~~ SOAJ: 1.7000 mg | SUBCUTANEOUS | 0 refills | Status: DC

## 2022-02-02 MED ORDER — SEMAGLUTIDE-WEIGHT MANAGEMENT 2.4 MG/0.75ML ~~LOC~~ SOAJ: 2.4000 mg | SUBCUTANEOUS | 0 refills | Status: DC

## 2022-02-02 NOTE — Telephone Encounter (Signed)
Sent it to  and having the give the [facility]

## 2022-02-02 NOTE — Telephone Encounter (Signed)
Will route to [place] to verify which medication for weight loss needs to be sent.

## 2022-02-02 NOTE — Telephone Encounter (Signed)
Sent medication for him

## 2022-02-02 NOTE — Telephone Encounter (Signed)
Pt made aware

## 2022-06-13 ENCOUNTER — Ambulatory Visit (INDEPENDENT_AMBULATORY_CARE_PROVIDER_SITE_OTHER): Payer: Self-pay | Admitting: Family Medicine

## 2022-06-13 ENCOUNTER — Encounter: Payer: Self-pay | Admitting: Family Medicine

## 2022-06-13 VITALS — BP 101/67 | HR 73 | Ht 71.0 in | Wt 263.0 lb

## 2022-06-13 DIAGNOSIS — G4700 Insomnia, unspecified: Secondary | ICD-10-CM

## 2022-06-13 DIAGNOSIS — E785 Hyperlipidemia, unspecified: Secondary | ICD-10-CM

## 2022-06-13 DIAGNOSIS — E7800 Pure hypercholesterolemia, unspecified: Secondary | ICD-10-CM

## 2022-06-13 DIAGNOSIS — R7303 Prediabetes: Secondary | ICD-10-CM

## 2022-06-13 DIAGNOSIS — K2100 Gastro-esophageal reflux disease with esophagitis, without bleeding: Secondary | ICD-10-CM

## 2022-06-13 DIAGNOSIS — I10 Essential (primary) hypertension: Secondary | ICD-10-CM

## 2022-06-13 LAB — BAYER DCA HB A1C WAIVED: HB A1C (BAYER DCA - WAIVED): 5.6 % (ref 4.8–5.6)

## 2022-06-13 MED ORDER — TRAZODONE HCL 100 MG PO TABS: 50.0000 mg | ORAL_TABLET | Freq: Every evening | ORAL | 1 refills | Status: DC | PRN

## 2022-06-13 MED ORDER — CLOPIDOGREL BISULFATE 75 MG PO TABS: 75.0000 mg | ORAL_TABLET | Freq: Every day | ORAL | 3 refills | Status: DC

## 2022-06-13 MED ORDER — PANTOPRAZOLE SODIUM 20 MG PO TBEC: 20.0000 mg | DELAYED_RELEASE_TABLET | Freq: Every day | ORAL | 3 refills | Status: DC

## 2022-06-13 MED ORDER — LISINOPRIL 40 MG PO TABS: 40.0000 mg | ORAL_TABLET | Freq: Two times a day (BID) | ORAL | 1 refills | Status: DC

## 2022-06-13 MED ORDER — SEMAGLUTIDE(0.25 OR 0.5MG/DOS) 2 MG/3ML ~~LOC~~ SOPN
0.2500 mg | PEN_INJECTOR | SUBCUTANEOUS | 1 refills | Status: DC
Start: 2022-06-13 — End: 2022-08-17

## 2022-06-13 MED ORDER — ROSUVASTATIN CALCIUM 20 MG PO TABS: 20.0000 mg | ORAL_TABLET | Freq: Every day | ORAL | 3 refills | Status: DC

## 2022-06-13 NOTE — Addendum Note (Signed)
Addended by: Arville Care on: 06/13/2022 01:14 PM   Modules accepted: Orders

## 2022-06-13 NOTE — Progress Notes (Addendum)
BP 101/67   Pulse 73   Ht 5\' 11"  (1.803 m)   Wt 263 lb (119.3 kg)   SpO2 98%   BMI 36.68 kg/m    Subjective:   Patient ID: Jason Morris, male    DOB: 12-04-1975, 47 y.o.   MRN: [id number]  HPI: Jason Morris is a 47 y.o. male presenting on 06/13/2022 for Medical Management of Chronic Issues, Hypertension, and Prediabetes   HPI Prediabetes Patient comes in today for recheck of his diabetes. Patient has been currently taking Mounjaro and metformin. Patient is currently on an ACE inhibitor/ARB. Patient has not seen an ophthalmologist this year. Patient denies any new issues with their feet. The symptom started onset as an adult hypertension- and hld ARE RELATED TO DM   Hyperlipidemia Patient is coming in for recheck of his hyperlipidemia. The patient is currently taking crestor. They deny any issues with myalgias or history of liver damage from it. They deny any focal numbness or weakness or chest pain.   Hypertension Patient is currently on lisinopril, and their blood pressure today is 101/67. Patient denies any lightheadedness or dizziness. Patient denies headaches, blurred vision, chest pains, shortness of breath, or weakness. Denies any side effects from medication and is content with current medication. Marland Kitchen  GERD Patient is currently on pantoprazole.  She denies any major symptoms or abdominal pain or belching or burping. She denies any blood in her stool or lightheadedness or dizziness.   Patient is instructed and has tried some over-the-counter agents.  He does admit that he snores but he feels like he only sleeps a couple hours a night.  Relevant past medical, surgical, family and social history reviewed and updated as indicated. Interim medical history since our last visit reviewed. Allergies and medications reviewed and updated.  Review of Systems  Constitutional:  Negative for chills and fever.  Eyes:  Negative for visual disturbance.  Respiratory:  Negative for shortness  of breath and wheezing.   Cardiovascular:  Negative for chest pain and leg swelling.  Musculoskeletal:  Negative for back pain and gait problem.  Skin:  Negative for rash.  Neurological:  Negative for dizziness, weakness and light-headedness.  All other systems reviewed and are negative.   Per HPI unless specifically indicated above   Allergies as of 06/13/2022       Reactions   Lipitor [atorvastatin] Hives   Lopressor [metoprolol Tartrate] Shortness Of Breath   Felt like he was going to die , could not breath & syncope   Egg-derived Products Other (See Comments)   Really bad stomach pain   Lactose Intolerance (gi) Diarrhea        Medication List        Accurate as of June 13, 2022  1:14 PM. If you have any questions, ask your nurse or doctor.          STOP taking these medications    amoxicillin-clavulanate 875-125 MG tablet Commonly known as: AUGMENTIN Stopped by: Elige Radon Triton Heidrich, MD   HYDROcodone bit-homatropine 5-1.5 MG/5ML syrup Commonly known as: HYCODAN Stopped by: Nils Pyle, MD   L-Arginine 1000 MG Tabs Stopped by: Elige Radon Ahmia Colford, MD   metFORMIN 500 MG tablet Commonly known as: GLUCOPHAGE Stopped by: Elige Radon Brunetta Newingham, MD       TAKE these medications    clopidogrel 75 MG tablet Commonly known as: PLAVIX Take 1 tablet (75 mg total) by mouth daily.   lisinopril 40 MG tablet Commonly known as: ZESTRIL  Take 1 tablet (40 mg total) by mouth 2 (two) times daily. What changed: additional instructions Changed by: Elige Radon Emlyn Maves, MD   multivitamin tablet Take 1 tablet by mouth daily.   pantoprazole 20 MG tablet Commonly known as: Protonix Take 1 tablet (20 mg total) by mouth daily.   rosuvastatin 20 MG tablet Commonly known as: CRESTOR Take 1 tablet (20 mg total) by mouth daily.   Semaglutide(0.25 or 0.5MG /DOS) 2 MG/3ML Sopn Inject 0.25 mg into the skin once a week.   traZODone 100 MG tablet Commonly known as:  DESYREL Take 0.5-1 tablets (50-100 mg total) by mouth at bedtime as needed for sleep. Started by: Elige Radon Heber Hoog, MD         Objective:   BP 101/67   Pulse 73   Ht 5\' 11"  (1.803 m)   Wt 263 lb (119.3 kg)   SpO2 98%   BMI 36.68 kg/m   Wt Readings from Last 3 Encounters:  06/13/22 263 lb (119.3 kg)  12/12/21 295 lb (133.8 kg)  07/21/20 279 lb (126.6 kg)    Physical Exam Vitals and nursing note reviewed.  Constitutional:      General: He is not in acute distress.    Appearance: He is well-developed. He is not diaphoretic.  Eyes:     General: No scleral icterus.    Conjunctiva/sclera: Conjunctivae normal.  Neck:     Thyroid: No thyromegaly.  Cardiovascular:     Rate and Rhythm: Normal rate and regular rhythm.     Heart sounds: Normal heart sounds. No murmur heard. Pulmonary:     Effort: Pulmonary effort is normal. No respiratory distress.     Breath sounds: Normal breath sounds. No wheezing.  Musculoskeletal:        General: No swelling. Normal range of motion.     Cervical back: Neck supple.  Lymphadenopathy:     Cervical: No cervical adenopathy.  Skin:    General: Skin is warm and dry.     Findings: No rash.  Neurological:     Mental Status: He is alert and oriented to person, place, and time.     Coordination: Coordination normal.  Psychiatric:        Behavior: Behavior normal.     This 5.6, looks much better, he is doing Mounjaro through the compound pharmacy so continue with that and stop the metformin  Assessment & Plan:   Problem List Items Addressed This Visit       Cardiovascular and Mediastinum   Essential hypertension   Relevant Medications   lisinopril (ZESTRIL) 40 MG tablet   rosuvastatin (CRESTOR) 20 MG tablet   clopidogrel (PLAVIX) 75 MG tablet   Other Relevant Orders   CBC with Differential/Platelet   CMP14+EGFR   Lipid panel   Bayer DCA Hb A1c Waived     Digestive   GERD (gastroesophageal reflux disease)   Relevant  Medications   pantoprazole (PROTONIX) 20 MG tablet     Other   Prediabetes - Primary   Relevant Medications   Semaglutide,0.25 or 0.5MG /DOS, 2 MG/3ML SOPN   clopidogrel (PLAVIX) 75 MG tablet   Other Relevant Orders   CBC with Differential/Platelet   CMP14+EGFR   Lipid panel   Bayer DCA Hb A1c Waived   Hypercholesterolemia   Relevant Medications   lisinopril (ZESTRIL) 40 MG tablet   rosuvastatin (CRESTOR) 20 MG tablet   clopidogrel (PLAVIX) 75 MG tablet   Insomnia   Other Visit Diagnoses     Dyslipidemia  Relevant Medications   rosuvastatin (CRESTOR) 20 MG tablet   Other Relevant Orders   CBC with Differential/Platelet   CMP14+EGFR   Lipid panel   Bayer DCA Hb A1c Waived      A1c 5.6  Patient's blood pressure looks good.  His A1c looks good  Follow up plan: Return in about 6 months (around 12/13/2022), or if symptoms worsen or fail to improve, for prediabetes and hyperlipidemia.  Counseling provided for all of the vaccine components Orders Placed This Encounter  Procedures   CBC with Differential/Platelet   CMP14+EGFR   Lipid panel   Bayer DCA Hb A1c Waived    Arville Care, MD [facility] 06/13/2022, 1:14 PM

## 2022-06-14 LAB — CBC WITH DIFFERENTIAL/PLATELET
Basophils Absolute: 0 10*3/uL (ref 0.0–0.2)
Basos: 0 %
EOS (ABSOLUTE): 0.1 10*3/uL (ref 0.0–0.4)
Eos: 2 %
Hematocrit: 46.9 % (ref 37.5–51.0)
Hemoglobin: 15.4 g/dL (ref 13.0–17.7)
Immature Grans (Abs): 0 10*3/uL (ref 0.0–0.1)
Immature Granulocytes: 1 %
Lymphocytes Absolute: 2.7 10*3/uL (ref 0.7–3.1)
Lymphs: 31 %
MCH: 29.3 pg (ref 26.6–33.0)
MCHC: 32.8 g/dL (ref 31.5–35.7)
MCV: 89 fL (ref 79–97)
Monocytes Absolute: 0.7 10*3/uL (ref 0.1–0.9)
Monocytes: 8 %
Neutrophils Absolute: 5.1 10*3/uL (ref 1.4–7.0)
Neutrophils: 58 %
Platelets: 214 10*3/uL (ref 150–450)
RBC: 5.25 x10E6/uL (ref 4.14–5.80)
RDW: 12.2 % (ref 11.6–15.4)
WBC: 8.6 10*3/uL (ref 3.4–10.8)

## 2022-06-14 LAB — LIPID PANEL
Chol/HDL Ratio: 6.3 ratio — ABNORMAL HIGH (ref 0.0–5.0)
Cholesterol, Total: 170 mg/dL (ref 100–199)
HDL: 27 mg/dL — ABNORMAL LOW (ref >39)
LDL Chol Calc (NIH): 115 mg/dL — ABNORMAL HIGH (ref 0–99)
Triglycerides: 155 mg/dL — ABNORMAL HIGH (ref 0–149)
VLDL Cholesterol Cal: 28 mg/dL (ref 5–40)

## 2022-06-14 LAB — CMP14+EGFR
ALT: 28 IU/L (ref 0–44)
AST: 18 IU/L (ref 0–40)
Albumin/Globulin Ratio: 1.9 (ref 1.2–2.2)
Albumin: 4.4 g/dL (ref 4.1–5.1)
Alkaline Phosphatase: 59 IU/L (ref 44–121)
BUN/Creatinine Ratio: 11 (ref 9–20)
BUN: 11 mg/dL (ref 6–24)
Bilirubin Total: 0.2 mg/dL (ref 0.0–1.2)
CO2: 22 mmol/L (ref 20–29)
Calcium: 9.5 mg/dL (ref 8.7–10.2)
Chloride: 104 mmol/L (ref 96–106)
Creatinine, Ser: 0.96 mg/dL (ref 0.76–1.27)
Globulin, Total: 2.3 g/dL (ref 1.5–4.5)
Glucose: 96 mg/dL (ref 70–99)
Potassium: 4.7 mmol/L (ref 3.5–5.2)
Sodium: 139 mmol/L (ref 134–144)
Total Protein: 6.7 g/dL (ref 6.0–8.5)
eGFR: 99 mL/min/{1.73_m2} (ref >59)

## 2022-06-15 ENCOUNTER — Other Ambulatory Visit: Payer: Self-pay | Admitting: Family Medicine

## 2022-06-19 ENCOUNTER — Telehealth: Payer: Self-pay | Admitting: Family Medicine

## 2022-06-20 ENCOUNTER — Telehealth: Payer: Self-pay | Admitting: Family Medicine

## 2022-06-20 ENCOUNTER — Encounter: Payer: Self-pay | Admitting: Family Medicine

## 2022-06-20 ENCOUNTER — Ambulatory Visit (INDEPENDENT_AMBULATORY_CARE_PROVIDER_SITE_OTHER): Payer: Self-pay

## 2022-06-20 ENCOUNTER — Ambulatory Visit (INDEPENDENT_AMBULATORY_CARE_PROVIDER_SITE_OTHER): Payer: Self-pay | Admitting: Family Medicine

## 2022-06-20 VITALS — BP 127/77 | HR 70 | Temp 97.5°F | Ht 71.0 in

## 2022-06-20 DIAGNOSIS — M25461 Effusion, right knee: Secondary | ICD-10-CM

## 2022-06-20 MED ORDER — NABUMETONE 500 MG PO TABS
1000.0000 mg | ORAL_TABLET | Freq: Two times a day (BID) | ORAL | 1 refills | Status: DC
Start: 2022-06-20 — End: 2022-11-20

## 2022-06-20 NOTE — Telephone Encounter (Signed)
Patient aware and verbalized understanding. °

## 2022-06-20 NOTE — Telephone Encounter (Signed)
DSCUSS AT APPT TODAY

## 2022-06-20 NOTE — Telephone Encounter (Signed)
Pt seen [facility] this morning. Medication for inflammation has not been sent in. Please send to [facility].  Pt states that he needs recommendations on where to get knee brace. He cannot find one anywhere. The one giving here was not made correctly per pt and he had to bring it back.

## 2022-06-20 NOTE — Progress Notes (Signed)
Subjective:  Patient ID: Jason Morris, male    DOB: 09-30-1975  Age: 47 y.o. MRN: [id number]  CC: Knee Pain (Right knee pain no injury Jason Morris is aware. Started Friday. Jason Morris has been over using it a lot lately. )   HPI Jason Morris presents for knee pain. Jason Morris. On knees, wears pads a lot at work. Had it twisted at an odd angle 5 days ago while using the zero turn. Felt a pop at work 2 days ago. Woke the next day and had to crawl due to severe swelling and massive pain.      06/20/2022    8:05 AM 06/13/2022   11:25 AM 06/13/2022   11:24 AM  Depression screen PHQ 2/9  Decreased Interest 0  0  Down, Depressed, Hopeless 0  0  PHQ - 2 Score 0  0  Altered sleeping 0 0   Tired, decreased energy 0 0   Change in appetite 0 0   Feeling bad or failure about yourself  0 0   Trouble concentrating 0 0   Moving slowly or fidgety/restless 0 0   Suicidal thoughts 0 0   PHQ-9 Score 0    Difficult doing work/chores Not difficult at all Not difficult at all     History Jason Morris has a past medical history of Anxiety, CAD (coronary artery disease), Chronic lower back pain, Depression, GERD (gastroesophageal reflux disease), HLD (hyperlipidemia), Hypertension, Migraines, and Tobacco abuse.   Jason Morris has a past surgical history that includes Myringotomy with tube placement (Bilateral) and left heart cath (N/A, 02/21/2014).   His family history includes Heart attack (age of onset: 71) in his father, paternal grandfather, and paternal uncle.Jason Morris reports that Jason Morris has been smoking cigarettes. Jason Morris has a 17.00 pack-year smoking history. Jason Morris has never used smokeless tobacco. Jason Morris reports that Jason Morris does not drink alcohol and does not use drugs.    ROS Review of Systems  Constitutional:  Positive for activity change. Negative for fever.  HENT: Negative.    Respiratory:  Negative for shortness of breath.   Cardiovascular:  Negative for chest pain.  Musculoskeletal:  Positive for arthralgias.  Skin:  Negative for  rash.    Objective:  BP 127/77   Pulse 70   Temp (!) 97.5 F (36.4 C) (Temporal)   Ht 5\' 11"  (1.803 m)   SpO2 97%   BMI 36.68 kg/m   BP Readings from Last 3 Encounters:  06/20/22 127/77  06/13/22 101/67  12/12/21 129/82    Wt Readings from Last 3 Encounters:  06/13/22 263 lb (119.3 kg)  12/12/21 295 lb (133.8 kg)  07/21/20 279 lb (126.6 kg)     Physical Exam Vitals reviewed.  Constitutional:      Appearance: Jason Morris is well-developed.  HENT:     Head: Normocephalic and atraumatic.     Right Ear: External ear normal.     Left Ear: External ear normal.     Mouth/Throat:     Pharynx: No oropharyngeal exudate or posterior oropharyngeal erythema.  Eyes:     Pupils: Pupils are equal, round, and reactive to light.  Cardiovascular:     Rate and Rhythm: Normal rate and regular rhythm.     Heart sounds: No murmur heard. Pulmonary:     Effort: No respiratory distress.     Breath sounds: Normal breath sounds.  Musculoskeletal:        General: Tenderness present.     Cervical back: Normal range of motion  and neck supple.     Right knee: Effusion present. Abnormal meniscus: McMurray for alteral meniscus.     Instability Tests: Anterior drawer test negative. Posterior drawer test negative. Anterior Lachman test negative. Lateral McMurray test positive. Medial McMurray test negative.  Skin:    General: Skin is warm and dry.  Neurological:     Mental Status: Jason Morris is alert and oriented to person, place, and time.       Assessment & Plan:   Jason Morris was seen today for knee pain.  Diagnoses and all orders for this visit:  Effusion of right knee -     DG Knee 1-2 Views Right; Future  Other orders -     nabumetone (RELAFEN) 500 MG tablet; Take 2 tablets (1,000 mg total) by mouth 2 (two) times daily. For muscle and joint pain       I have discontinued Knute Neu. Stirn's pantoprazole. I am also having him start on nabumetone. Additionally, I am having him maintain his  multivitamin, lisinopril, rosuvastatin, Semaglutide(0.25 or 0.5MG /DOS), clopidogrel, and traZODone.  Allergies as of 06/20/2022       Reactions   Lipitor [atorvastatin] Hives   Lopressor [metoprolol Tartrate] Shortness Of Breath   Felt like Jason Morris was going to die , could not breath & syncope   Egg-derived Products Other (See Comments)   Really bad stomach pain   Lactose Intolerance (gi) Diarrhea        Medication List        Accurate as of June 20, 2022 11:59 PM. If you have any questions, ask your nurse or doctor.          STOP taking these medications    pantoprazole 20 MG tablet Commonly known as: Protonix Stopped by: Mechele Claude, MD       TAKE these medications    clopidogrel 75 MG tablet Commonly known as: PLAVIX Take 1 tablet (75 mg total) by mouth daily.   lisinopril 40 MG tablet Commonly known as: ZESTRIL Take 1 tablet (40 mg total) by mouth 2 (two) times daily.   multivitamin tablet Take 1 tablet by mouth daily.   nabumetone 500 MG tablet Commonly known as: RELAFEN Take 2 tablets (1,000 mg total) by mouth 2 (two) times daily. For muscle and joint pain Started by: Mechele Claude, MD   rosuvastatin 20 MG tablet Commonly known as: CRESTOR Take 1 tablet (20 mg total) by mouth daily.   Semaglutide(0.25 or 0.5MG /DOS) 2 MG/3ML Sopn Inject 0.25 mg into the skin once a week.   traZODone 100 MG tablet Commonly known as: DESYREL Take 0.5-1 tablets (50-100 mg total) by mouth at bedtime as needed for sleep.       Knee brace fitted  Follow-up: Return if symptoms worsen or fail to improve.  Mechele Claude, M.D.

## 2022-06-20 NOTE — Telephone Encounter (Signed)
Sorry for the delay, but I sent it in late morning.

## 2022-06-23 ENCOUNTER — Encounter: Payer: Self-pay | Admitting: Family Medicine

## 2022-06-28 ENCOUNTER — Telehealth: Payer: Self-pay | Admitting: Family Medicine

## 2022-06-28 NOTE — Telephone Encounter (Signed)
Lab encounter already opened - please refer to lab result

## 2022-08-17 ENCOUNTER — Encounter: Payer: Self-pay | Admitting: Family Medicine

## 2022-08-17 ENCOUNTER — Ambulatory Visit (INDEPENDENT_AMBULATORY_CARE_PROVIDER_SITE_OTHER): Payer: Self-pay | Admitting: Family Medicine

## 2022-08-17 DIAGNOSIS — I10 Essential (primary) hypertension: Secondary | ICD-10-CM

## 2022-08-17 DIAGNOSIS — E1169 Type 2 diabetes mellitus with other specified complication: Secondary | ICD-10-CM

## 2022-08-17 MED ORDER — LISINOPRIL 40 MG PO TABS: 40.0000 mg | ORAL_TABLET | Freq: Every day | ORAL | 1 refills | Status: DC

## 2022-08-17 NOTE — Progress Notes (Signed)
BP 107/72   Pulse 76   Ht 5\' 11"  (1.803 m)   Wt 237 lb (107.5 kg)   SpO2 98%   BMI 33.05 kg/m    Subjective:   Patient ID: Jason Morris, male    DOB: 1975-10-11, 47 y.o.   MRN: [id number]  HPI: Jason Morris is a 47 y.o. male presenting on 08/17/2022 for Weight Management Screening   HPI Obesity and weight recheck Patient is coming in today for obesity and weight recheck.  He has been on Wegovy 0.25.  Over the past 2 months he has dropped down from 263 to 237 pounds which is a 26 pound weight loss.  He is feeling like he is doing pretty good.  He does have prediabetes this is helping with his type 2 diabetes.  Type 2 diabetes mellitus Patient comes in today for recheck of his diabetes. Patient has been currently taking tirzepatide. Patient is currently on an ACE inhibitor/ARB. Patient has not seen an ophthalmologist this year. Patient denies any new issues with their feet. The symptom started onset as an adult hypertension and hyperlipidemia ARE RELATED TO DM   Relevant past medical, surgical, family and social history reviewed and updated as indicated. Interim medical history since our last visit reviewed. Allergies and medications reviewed and updated.  Review of Systems  Constitutional:  Negative for chills and fever.  Eyes:  Negative for visual disturbance.  Respiratory:  Negative for shortness of breath and wheezing.   Cardiovascular:  Negative for chest pain and leg swelling.  Musculoskeletal:  Positive for arthralgias and joint swelling. Negative for back pain and gait problem.  Skin:  Negative for rash.  Neurological:  Negative for dizziness, weakness and light-headedness.  All other systems reviewed and are negative.   Per HPI unless specifically indicated above   Allergies as of 08/17/2022       Reactions   Lipitor [atorvastatin] Hives   Lopressor [metoprolol Tartrate] Shortness Of Breath   Felt like he was going to die , could not breath & syncope   Egg-derived  Products Other (See Comments)   Really bad stomach pain   Lactose Intolerance (gi) Diarrhea        Medication List        Accurate as of August 17, 2022  8:28 AM. If you have any questions, ask your nurse or doctor.          STOP taking these medications    Semaglutide(0.25 or 0.5MG /DOS) 2 MG/3ML Sopn Stopped by: Elige Radon Adilynne Fitzwater       TAKE these medications    clopidogrel 75 MG tablet Commonly known as: PLAVIX Take 1 tablet (75 mg total) by mouth daily.   lisinopril 40 MG tablet Commonly known as: ZESTRIL Take 1 tablet (40 mg total) by mouth daily. What changed: when to take this Changed by: Elige Radon Alleah Dearman   multivitamin tablet Take 1 tablet by mouth daily.   nabumetone 500 MG tablet Commonly known as: RELAFEN Take 2 tablets (1,000 mg total) by mouth 2 (two) times daily. For muscle and joint pain   rosuvastatin 20 MG tablet Commonly known as: CRESTOR Take 1 tablet (20 mg total) by mouth daily.   tirzepatide 7.5 MG/0.5ML Pen Commonly known as: MOUNJARO Inject 7.5 mg into the skin once a week.   traZODone 100 MG tablet Commonly known as: DESYREL Take 0.5-1 tablets (50-100 mg total) by mouth at bedtime as needed for sleep.  Objective:   BP 107/72   Pulse 76   Ht 5\' 11"  (1.803 m)   Wt 237 lb (107.5 kg)   SpO2 98%   BMI 33.05 kg/m   Wt Readings from Last 3 Encounters:  08/17/22 237 lb (107.5 kg)  06/13/22 263 lb (119.3 kg)  12/12/21 295 lb (133.8 kg)    Physical Exam Vitals and nursing note reviewed.  Constitutional:      General: He is not in acute distress.    Appearance: He is well-developed. He is not diaphoretic.  Eyes:     General: No scleral icterus.       Right eye: No discharge.     Conjunctiva/sclera: Conjunctivae normal.     Pupils: Pupils are equal, round, and reactive to light.  Neck:     Thyroid: No thyromegaly.  Cardiovascular:     Rate and Rhythm: Normal rate and regular rhythm.     Heart sounds: Normal  heart sounds. No murmur heard. Pulmonary:     Effort: Pulmonary effort is normal. No respiratory distress.     Breath sounds: Normal breath sounds. No wheezing.  Musculoskeletal:        General: Tenderness present. Normal range of motion.     Cervical back: Neck supple.     Right knee: Tenderness present over the medial joint line. No LCL laxity, MCL laxity or ACL laxity. Abnormal meniscus (Positive medial meniscus).     Instability Tests: Medial McMurray test positive.  Lymphadenopathy:     Cervical: No cervical adenopathy.  Skin:    General: Skin is warm and dry.     Findings: No rash.  Neurological:     Mental Status: He is alert and oriented to person, place, and time.     Coordination: Coordination normal.  Psychiatric:        Behavior: Behavior normal.       Assessment & Plan:   Problem List Items Addressed This Visit       Cardiovascular and Mediastinum   Essential hypertension   Relevant Medications   lisinopril (ZESTRIL) 40 MG tablet     Endocrine   Type 2 diabetes mellitus with other specified complication (HCC)   Relevant Medications   tirzepatide (MOUNJARO) 7.5 MG/0.5ML Pen   lisinopril (ZESTRIL) 40 MG tablet     Other   Morbid obesity (HCC) - Primary   Relevant Medications   tirzepatide (MOUNJARO) 7.5 MG/0.5ML Pen    Patient's BMI is >30 mg/m2.  Patient's current BMI is Body mass index is 33.05 kg/m.Marland Kitchen  Patient has lost and maintained a 5% body weight loss.  Patient is currently enrolled in a healthy eating plan along with encouraged exercise.   Patient does not have a personal or family history of medullary thyroid carcinoma (MTC) or Multiple Endocrine Neoplasia syndrome type 2 (MEN 2).   On exam likely medial meniscus injury, recommended that he try anti-inflammatories first because he does not have insurance and if he cannot calm it down from there then can talk to an orthopedic and see how much it would cost to repair.  Follow up plan: Return in  about 3 months (around 11/17/2022), or if symptoms worsen or fail to improve, for Diabetes recheck.  Counseling provided for all of the vaccine components No orders of the defined types were placed in this encounter.   Arville Care, MD [facility] 08/17/2022, 8:28 AM

## 2022-11-23 ENCOUNTER — Encounter: Payer: Self-pay | Admitting: Family Medicine

## 2022-11-23 ENCOUNTER — Ambulatory Visit (INDEPENDENT_AMBULATORY_CARE_PROVIDER_SITE_OTHER): Payer: Self-pay | Admitting: Family Medicine

## 2022-11-23 VITALS — BP 127/76 | HR 62 | Temp 97.6°F | Ht 71.0 in | Wt 222.0 lb

## 2022-11-23 DIAGNOSIS — I10 Essential (primary) hypertension: Secondary | ICD-10-CM

## 2022-11-23 DIAGNOSIS — E1169 Type 2 diabetes mellitus with other specified complication: Secondary | ICD-10-CM

## 2022-11-23 DIAGNOSIS — Z23 Encounter for immunization: Secondary | ICD-10-CM

## 2022-11-23 DIAGNOSIS — E7800 Pure hypercholesterolemia, unspecified: Secondary | ICD-10-CM

## 2022-11-23 DIAGNOSIS — Z716 Tobacco abuse counseling: Secondary | ICD-10-CM

## 2022-11-23 DIAGNOSIS — N529 Male erectile dysfunction, unspecified: Secondary | ICD-10-CM

## 2022-11-23 LAB — BAYER DCA HB A1C WAIVED: HB A1C (BAYER DCA - WAIVED): 5.5 % (ref 4.8–5.6)

## 2022-11-23 MED ORDER — TRAZODONE HCL 100 MG PO TABS: 50.0000 mg | ORAL_TABLET | Freq: Every evening | ORAL | 1 refills | Status: DC | PRN

## 2022-11-23 MED ORDER — ROSUVASTATIN CALCIUM 20 MG PO TABS: 20.0000 mg | ORAL_TABLET | Freq: Every day | ORAL | 3 refills | Status: DC

## 2022-11-23 MED ORDER — NABUMETONE 500 MG PO TABS: 1000.0000 mg | ORAL_TABLET | Freq: Two times a day (BID) | ORAL | 1 refills | Status: DC

## 2022-11-23 MED ORDER — VARENICLINE TARTRATE 1 MG PO TABS: 1.0000 mg | ORAL_TABLET | Freq: Two times a day (BID) | ORAL | 1 refills | Status: DC

## 2022-11-23 MED ORDER — LISINOPRIL 40 MG PO TABS: 40.0000 mg | ORAL_TABLET | Freq: Every day | ORAL | 1 refills | Status: DC

## 2022-11-23 MED ORDER — VARENICLINE TARTRATE (STARTER) 0.5 MG X 11 & 1 MG X 42 PO TBPK: ORAL_TABLET | ORAL | 0 refills | Status: AC

## 2022-11-23 MED ORDER — SILDENAFIL CITRATE 20 MG PO TABS: 20.0000 mg | ORAL_TABLET | ORAL | 1 refills | Status: DC | PRN

## 2022-11-23 NOTE — Progress Notes (Signed)
BP 127/76   Pulse 62   Temp 97.6 F (36.4 C)   Ht 5\' 11"  (1.803 m)   SpO2 99%   BMI 33.05 kg/m    Subjective:   Patient ID: Jason Morris, male    DOB: [birth year], 47 y.o.   MRN: [id number]  HPI: Jason Morris is a 47 y.o. male presenting on 11/23/2022 for Medical Management of Chronic Issues, Prediabetes, Hypertension, and Hyperlipidemia   HPI Prediabetes Patient comes in today for recheck of his diabetes. Patient has been currently taking Mounjaro. Patient is currently on an ACE inhibitor/ARB. Patient has not seen an ophthalmologist this year. Patient denies any new issues with their feet. The symptom started onset as an adult hypertension and hyperlipidemia and CAD ARE RELATED TO DM   Hypertension Patient is currently on lisinopril, and their blood pressure today is 127/76. Patient denies any lightheadedness or dizziness. Patient denies headaches, blurred vision, chest pains, shortness of breath, or weakness. Denies any side effects from medication and is content with current medication.   Hyperlipidemia Patient is coming in for recheck of his hyperlipidemia. The patient is currently taking Crestor. They deny any issues with myalgias or history of liver damage from it. They deny any focal numbness or weakness or chest pain.   Patient says he is having some decreased sexual drive and some erectile dysfunction.  He would like to try medication for this.  He says had Viagra before and it seemed to help and he would like to try that again.  Patient would like to try smoking cessation and he would like to try Chantix.  He is self-pay and instructed him how to use [email or web address].  Relevant past medical, surgical, family and social history reviewed and updated as indicated. Interim medical history since our last visit reviewed. Allergies and medications reviewed and updated.  Review of Systems  Constitutional:  Negative for chills and fever.  Eyes:  Negative for visual disturbance.   Respiratory:  Negative for shortness of breath and wheezing.   Cardiovascular:  Negative for chest pain and leg swelling.  Genitourinary:  Negative for penile discharge, penile pain and testicular pain.  Musculoskeletal:  Negative for back pain and gait problem.  Skin:  Negative for rash.  All other systems reviewed and are negative.   Per HPI unless specifically indicated above   Allergies as of 11/23/2022       Reactions   Lipitor [atorvastatin] Hives   Lopressor [metoprolol Tartrate] Shortness Of Breath   Felt like he was going to die , could not breath & syncope   Egg-derived Products Other (See Comments)   Really bad stomach pain   Lactose Intolerance (gi) Diarrhea        Medication List        Accurate as of November 23, 2022  9:11 AM. If you have any questions, ask your nurse or doctor.          clopidogrel 75 MG tablet Commonly known as: PLAVIX Take 1 tablet (75 mg total) by mouth daily.   lisinopril 40 MG tablet Commonly known as: ZESTRIL Take 1 tablet (40 mg total) by mouth daily.   multivitamin tablet Take 1 tablet by mouth daily.   nabumetone 500 MG tablet Commonly known as: RELAFEN Take 2 tablets (1,000 mg total) by mouth 2 (two) times daily. For muscle and joint pain   rosuvastatin 20 MG tablet Commonly known as: CRESTOR Take 1 tablet (20 mg total) by mouth daily.  sildenafil 20 MG tablet Commonly known as: REVATIO Take 1-5 tablets (20-100 mg total) by mouth as needed. Started by: Elige Radon Alantra Popoca   tirzepatide 7.5 MG/0.5ML Pen Commonly known as: MOUNJARO Inject 7.5 mg into the skin once a week.   traZODone 100 MG tablet Commonly known as: DESYREL Take 0.5-1 tablets (50-100 mg total) by mouth at bedtime as needed for sleep.   varenicline 1 MG tablet Commonly known as: CHANTIX Take 1 tablet (1 mg total) by mouth 2 (two) times daily. Started by: Elige Radon Otho Michalik   Varenicline Tartrate (Starter) 0.5 MG X 11 & 1 MG X 42  Tbpk Commonly known as: Chantix Starting Month Pak Take 0.5 mg by mouth daily for 3 days, THEN 0.5 mg 2 (two) times daily for 4 days, THEN 1 mg 2 (two) times daily for 23 days. Start taking on: November 23, 2022 Started by: Ivin Booty A Cavon Nicolls         Objective:   BP 127/76   Pulse 62   Temp 97.6 F (36.4 C)   Ht 5\' 11"  (1.803 m)   SpO2 99%   BMI 33.05 kg/m   Wt Readings from Last 3 Encounters:  08/17/22 237 lb (107.5 kg)  06/13/22 263 lb (119.3 kg)  12/12/21 295 lb (133.8 kg)    Physical Exam Vitals and nursing note reviewed.  Constitutional:      General: He is not in acute distress.    Appearance: He is well-developed. He is not diaphoretic.  Eyes:     General: No scleral icterus.    Conjunctiva/sclera: Conjunctivae normal.  Neck:     Thyroid: No thyromegaly.  Cardiovascular:     Rate and Rhythm: Normal rate and regular rhythm.     Heart sounds: Normal heart sounds. No murmur heard. Pulmonary:     Effort: Pulmonary effort is normal. No respiratory distress.     Breath sounds: Normal breath sounds. No wheezing.  Musculoskeletal:        General: No swelling. Normal range of motion.     Cervical back: Neck supple.  Lymphadenopathy:     Cervical: No cervical adenopathy.  Skin:    General: Skin is warm and dry.     Findings: No rash.  Neurological:     Mental Status: He is alert and oriented to person, place, and time.     Coordination: Coordination normal.  Psychiatric:        Behavior: Behavior normal.       Assessment & Plan:   Problem List Items Addressed This Visit       Cardiovascular and Mediastinum   Essential hypertension   Relevant Medications   lisinopril (ZESTRIL) 40 MG tablet   rosuvastatin (CRESTOR) 20 MG tablet   sildenafil (REVATIO) 20 MG tablet   Other Relevant Orders   CBC with Differential/Platelet   CMP14+EGFR   Lipid panel   PSA, total and free   Bayer DCA Hb A1c Waived     Endocrine   Type 2 diabetes mellitus with other  specified complication (HCC) - Primary   Relevant Medications   lisinopril (ZESTRIL) 40 MG tablet   rosuvastatin (CRESTOR) 20 MG tablet   Other Relevant Orders   CBC with Differential/Platelet   CMP14+EGFR   Lipid panel   PSA, total and free   Bayer DCA Hb A1c Waived   Microalbumin / creatinine urine ratio     Other   Hypercholesterolemia   Relevant Medications   lisinopril (ZESTRIL) 40 MG tablet  rosuvastatin (CRESTOR) 20 MG tablet   sildenafil (REVATIO) 20 MG tablet   ED (erectile dysfunction)   Relevant Medications   sildenafil (REVATIO) 20 MG tablet   Other Visit Diagnoses     Encounter for smoking cessation counseling       Relevant Medications   varenicline (CHANTIX) 1 MG tablet   Varenicline Tartrate, Starter, (CHANTIX STARTING MONTH PAK) 0.5 MG X 11 & 1 MG X 42 TBPK   Encounter for immunization       Relevant Orders   Flu vaccine trivalent PF, 6mos and older(Flulaval,Afluria,Fluarix,Fluzone) (Completed)       A1c looks good at 5.5.  Will try Chantix and also given prescription for Viagra that he can try.  Blood pressure and everything else looks good.  No changes Follow up plan: Return in about 6 months (around 05/23/2023), or if symptoms worsen or fail to improve, for Prediabetes and hypertension.  Counseling provided for all of the vaccine components Orders Placed This Encounter  Procedures   Flu vaccine trivalent PF, 6mos and older(Flulaval,Afluria,Fluarix,Fluzone)   CBC with Differential/Platelet   CMP14+EGFR   Lipid panel   PSA, total and free   Bayer DCA Hb A1c Waived   Microalbumin / creatinine urine ratio    Arville Care, MD [facility] 11/23/2022, 9:11 AM

## 2022-11-24 LAB — CMP14+EGFR
ALT: 28 IU/L (ref 0–44)
AST: 20 [IU]/L (ref 0–40)
Albumin: 4.2 g/dL (ref 4.1–5.1)
Alkaline Phosphatase: 57 IU/L (ref 44–121)
BUN/Creatinine Ratio: 13 (ref 9–20)
BUN: 13 mg/dL (ref 6–24)
Bilirubin Total: 0.5 mg/dL (ref 0.0–1.2)
CO2: 23 mmol/L (ref 20–29)
Calcium: 9.4 mg/dL (ref 8.7–10.2)
Chloride: 103 mmol/L (ref 96–106)
Creatinine, Ser: 0.98 mg/dL (ref 0.76–1.27)
Globulin, Total: 2 g/dL (ref 1.5–4.5)
Glucose: 84 mg/dL (ref 70–99)
Potassium: 4.4 mmol/L (ref 3.5–5.2)
Sodium: 139 mmol/L (ref 134–144)
Total Protein: 6.2 g/dL (ref 6.0–8.5)
eGFR: 97 mL/min/{1.73_m2} (ref >59)

## 2022-11-24 LAB — CBC WITH DIFFERENTIAL/PLATELET
Basophils Absolute: 0 10*3/uL (ref 0.0–0.2)
Basos: 0 %
EOS (ABSOLUTE): 0.1 10*3/uL (ref 0.0–0.4)
Eos: 2 %
Hematocrit: 46.5 % (ref 37.5–51.0)
Hemoglobin: 15.2 g/dL (ref 13.0–17.7)
Immature Grans (Abs): 0 10*3/uL (ref 0.0–0.1)
Immature Granulocytes: 0 %
Lymphocytes Absolute: 2.6 10*3/uL (ref 0.7–3.1)
Lymphs: 39 %
MCH: 30 pg (ref 26.6–33.0)
MCHC: 32.7 g/dL (ref 31.5–35.7)
MCV: 92 fL (ref 79–97)
Monocytes Absolute: 0.6 10*3/uL (ref 0.1–0.9)
Monocytes: 9 %
Neutrophils Absolute: 3.4 10*3/uL (ref 1.4–7.0)
Neutrophils: 50 %
Platelets: 189 10*3/uL (ref 150–450)
RBC: 5.07 x10E6/uL (ref 4.14–5.80)
RDW: 12.2 % (ref 11.6–15.4)
WBC: 6.8 10*3/uL (ref 3.4–10.8)

## 2022-11-24 LAB — PSA, TOTAL AND FREE
PSA, Free Pct: 40 %
PSA, Free: 0.08 ng/mL
Prostate Specific Ag, Serum: 0.2 ng/mL (ref 0.0–4.0)

## 2022-11-24 LAB — LIPID PANEL
Chol/HDL Ratio: 6 ratio — ABNORMAL HIGH (ref 0.0–5.0)
Cholesterol, Total: 174 mg/dL (ref 100–199)
HDL: 29 mg/dL — ABNORMAL LOW (ref >39)
LDL Chol Calc (NIH): 121 mg/dL — ABNORMAL HIGH (ref 0–99)
Triglycerides: 132 mg/dL (ref 0–149)
VLDL Cholesterol Cal: 24 mg/dL (ref 5–40)

## 2022-11-26 ENCOUNTER — Telehealth: Payer: Self-pay | Admitting: Family Medicine

## 2022-11-26 ENCOUNTER — Other Ambulatory Visit: Payer: Self-pay | Admitting: Family Medicine

## 2022-11-26 NOTE — Telephone Encounter (Signed)
I am sorry he feels that way, but in medicine we do have to evaluate what we are treating to make sure we are treating it appropriately.  Be more than happy to fit him in for an appointment

## 2022-11-26 NOTE — Telephone Encounter (Signed)
Spoke with pt regarding this and explained to him that he will need to be seen before he can be prescribed an antibiotic. Pt says he was just seen on Friday. Voiced understanding and asked if his ear infection was discussed. Pt said no. He said he didn't have an ear infection until yesterday. Voiced understanding and explained that because the ear infection was not discussed at his appt, he would need another appt to discuss.   Pt was upset and said that he would reach out to an online provider to request Rx for ear infection and wasn't going to play politics with [place].

## 2022-11-26 NOTE — Telephone Encounter (Signed)
Copied from CRM [id number]. Topic: Clinical - Medication Question >> Nov 26, 2022  8:43 AM Mosetta Putt H wrote: Reason for CRM: patient needs a prescription antibiotic for ear infection it started yesterday but its spreading fast.

## 2022-12-13 ENCOUNTER — Ambulatory Visit: Payer: Self-pay | Admitting: Family Medicine

## 2023-05-16 ENCOUNTER — Encounter (HOSPITAL_COMMUNITY): Payer: Self-pay

## 2023-05-23 ENCOUNTER — Ambulatory Visit (INDEPENDENT_AMBULATORY_CARE_PROVIDER_SITE_OTHER): Payer: Self-pay | Admitting: Family Medicine

## 2023-05-23 ENCOUNTER — Encounter: Payer: Self-pay | Admitting: Family Medicine

## 2023-05-23 VITALS — BP 99/65 | HR 68 | Ht 71.0 in | Wt 225.0 lb

## 2023-05-23 DIAGNOSIS — E7800 Pure hypercholesterolemia, unspecified: Secondary | ICD-10-CM

## 2023-05-23 DIAGNOSIS — N529 Male erectile dysfunction, unspecified: Secondary | ICD-10-CM

## 2023-05-23 DIAGNOSIS — Z716 Tobacco abuse counseling: Secondary | ICD-10-CM

## 2023-05-23 DIAGNOSIS — F339 Major depressive disorder, recurrent, unspecified: Secondary | ICD-10-CM

## 2023-05-23 DIAGNOSIS — Z7985 Long-term (current) use of injectable non-insulin antidiabetic drugs: Secondary | ICD-10-CM

## 2023-05-23 DIAGNOSIS — I10 Essential (primary) hypertension: Secondary | ICD-10-CM

## 2023-05-23 DIAGNOSIS — E1169 Type 2 diabetes mellitus with other specified complication: Secondary | ICD-10-CM

## 2023-05-23 LAB — BAYER DCA HB A1C WAIVED: HB A1C (BAYER DCA - WAIVED): 5.2 % (ref 4.8–5.6)

## 2023-05-23 LAB — LIPID PANEL

## 2023-05-23 MED ORDER — TRAZODONE HCL 100 MG PO TABS: 50.0000 mg | ORAL_TABLET | Freq: Every evening | ORAL | 3 refills | Status: AC | PRN

## 2023-05-23 MED ORDER — LISINOPRIL 40 MG PO TABS: 40.0000 mg | ORAL_TABLET | Freq: Every day | ORAL | 3 refills | Status: DC

## 2023-05-23 MED ORDER — SILDENAFIL CITRATE 20 MG PO TABS: 20.0000 mg | ORAL_TABLET | ORAL | 1 refills | Status: AC | PRN

## 2023-05-23 MED ORDER — VARENICLINE TARTRATE 1 MG PO TABS: 1.0000 mg | ORAL_TABLET | Freq: Two times a day (BID) | ORAL | 2 refills | Status: DC

## 2023-05-23 MED ORDER — OMEPRAZOLE 20 MG PO CPDR
20.0000 mg | DELAYED_RELEASE_CAPSULE | Freq: Every day | ORAL | 3 refills | Status: DC
Start: 2023-05-23 — End: 2023-11-25

## 2023-05-23 MED ORDER — CLOPIDOGREL BISULFATE 75 MG PO TABS: 75.0000 mg | ORAL_TABLET | Freq: Every day | ORAL | 3 refills | Status: DC

## 2023-05-23 MED ORDER — PAROXETINE HCL 20 MG PO TABS: 20.0000 mg | ORAL_TABLET | Freq: Every day | ORAL | 5 refills | Status: DC

## 2023-05-23 NOTE — Progress Notes (Signed)
 BP 99/65   Pulse 68   Ht 5\' 11"  (1.803 m)   Wt 225 lb (102.1 kg)   SpO2 99%   BMI 31.38 kg/m    Subjective:   Patient ID: Jason Morris, male    DOB: April 24, 1975, 48 y.o.   MRN: [id number]  HPI: Jason Morris is a 48 y.o. male presenting on 05/23/2023 for Medical Management of Chronic Issues, Diabetes, Hyperlipidemia, and Hypertension   HPI Type 2 diabetes mellitus Patient comes in today for recheck of his diabetes. Patient has been currently taking tirzepatide. Patient is currently on an ACE inhibitor/ARB. Patient has not seen an ophthalmologist this year. Patient denies any new issues with their feet. The symptom started onset as an adult hypertension and hyperlipidemia ARE RELATED TO DM   Hypertension Patient is currently on lisinopril , has been doubling up on, and their blood pressure today is 99/65. Patient denies any lightheadedness or dizziness. Patient denies headaches, blurred vision, chest pains, shortness of breath, or weakness. Denies any side effects from medication and is content with current medication.   Hyperlipidemia Patient is coming in for recheck of his hyperlipidemia. The patient is currently taking rosuvastatin . They deny any issues with myalgias or history of liver damage from it. They deny any focal numbness or weakness or chest pain.   Depression Patient has been feeling down and depressed recently.  He just found out that his daughter who is 79 is not actually his from his wife who is in hospice.  He says this has been a very challenging emotionally and socially situation feels isolated.  He denies any suicidal ideations or thoughts of hurting himself.  He would like to try something to help calm his emotions and his feelings of depression.    05/23/2023    8:04 AM 11/23/2022    8:27 AM 08/17/2022    8:31 AM 06/20/2022    8:05 AM 06/13/2022   11:25 AM  Depression screen PHQ 2/9  Decreased Interest 3 0 3 0   Down, Depressed, Hopeless 3 0 3 0   PHQ - 2 Score 6  0 6 0   Altered sleeping 3 0 3 0 0  Tired, decreased energy 3 0 2 0 0  Change in appetite 3 0 3 0 0  Feeling bad or failure about yourself  3 0 0 0 0  Trouble concentrating 3 0 2 0 0  Moving slowly or fidgety/restless 3 0 1 0 0  Suicidal thoughts 0 0 0 0 0  PHQ-9 Score 24 0 17 0   Difficult doing work/chores Extremely dIfficult Not difficult at all Extremely dIfficult Not difficult at all Not difficult at all     Erectile dysfunction recheck. Patient uses sildenafil  as needed for erectile dysfunction.  He is coming for recheck today and says that things are working well.  Smoking cessation Patient has been trying Chantix  for smoking cessation.  Relevant past medical, surgical, family and social history reviewed and updated as indicated. Interim medical history since our last visit reviewed. Allergies and medications reviewed and updated.  Review of Systems  Constitutional:  Negative for chills and fever.  Eyes:  Negative for visual disturbance.  Respiratory:  Negative for shortness of breath and wheezing.   Cardiovascular:  Negative for chest pain and leg swelling.  Skin:  Negative for rash.  Neurological:  Negative for dizziness and light-headedness.  Psychiatric/Behavioral:  Positive for dysphoric mood. Negative for self-injury, sleep disturbance and suicidal ideas. The patient is  nervous/anxious.   All other systems reviewed and are negative.   Per HPI unless specifically indicated above   Allergies as of 05/23/2023       Reactions   Lipitor [atorvastatin ] Hives   Lopressor  [metoprolol  Tartrate] Shortness Of Breath   Felt like he was going to die , could not breath & syncope   Egg-derived Products Other (See Comments)   Really bad stomach pain   Lactose Intolerance (gi) Diarrhea        Medication List        Accurate as of May 23, 2023  8:48 AM. If you have any questions, ask your nurse or doctor.          clopidogrel  75 MG tablet Commonly known as:  PLAVIX  Take 1 tablet (75 mg total) by mouth daily.   lisinopril  40 MG tablet Commonly known as: ZESTRIL  Take 1 tablet (40 mg total) by mouth daily.   multivitamin tablet Take 1 tablet by mouth daily.   nabumetone  500 MG tablet Commonly known as: RELAFEN  Take 2 tablets (1,000 mg total) by mouth 2 (two) times daily. For muscle and joint pain   omeprazole  20 MG capsule Commonly known as: PRILOSEC Take 1 capsule (20 mg total) by mouth daily. Started by: Lucio Sabin Leeanna Slaby   PARoxetine 20 MG tablet Commonly known as: PAXIL Take 1 tablet (20 mg total) by mouth daily. Started by: Lucio Sabin Shevaun Lovan   rosuvastatin  20 MG tablet Commonly known as: CRESTOR  Take 1 tablet (20 mg total) by mouth daily.   sildenafil  20 MG tablet Commonly known as: REVATIO  Take 1-5 tablets (20-100 mg total) by mouth as needed.   tirzepatide 7.5 MG/0.5ML Pen Commonly known as: MOUNJARO Inject 7.5 mg into the skin once a week.   traZODone  100 MG tablet Commonly known as: DESYREL  Take 0.5-1 tablets (50-100 mg total) by mouth at bedtime as needed for sleep.   varenicline  1 MG tablet Commonly known as: CHANTIX  Take 1 tablet (1 mg total) by mouth 2 (two) times daily.         Objective:   BP 99/65   Pulse 68   Ht 5\' 11"  (1.803 m)   Wt 225 lb (102.1 kg)   SpO2 99%   BMI 31.38 kg/m   Wt Readings from Last 3 Encounters:  05/23/23 225 lb (102.1 kg)  11/23/22 222 lb (100.7 kg)  08/17/22 237 lb (107.5 kg)    Physical Exam Vitals and nursing note reviewed.  Constitutional:      General: He is not in acute distress.    Appearance: He is well-developed. He is not diaphoretic.  Eyes:     General: No scleral icterus.    Conjunctiva/sclera: Conjunctivae normal.  Neck:     Thyroid : No thyromegaly.  Cardiovascular:     Rate and Rhythm: Normal rate and regular rhythm.     Heart sounds: Normal heart sounds. No murmur heard. Pulmonary:     Effort: Pulmonary effort is normal. No respiratory  distress.     Breath sounds: Normal breath sounds. No wheezing.  Musculoskeletal:        General: No swelling. Normal range of motion.     Cervical back: Neck supple.  Lymphadenopathy:     Cervical: No cervical adenopathy.  Skin:    General: Skin is warm and dry.     Findings: No rash.  Neurological:     Mental Status: He is alert and oriented to person, place, and time.     Coordination:  Coordination normal.  Psychiatric:        Mood and Affect: Mood is anxious and depressed.        Behavior: Behavior normal.        Thought Content: Thought content does not include suicidal ideation. Thought content does not include suicidal plan.       Assessment & Plan:   Problem List Items Addressed This Visit       Cardiovascular and Mediastinum   Essential hypertension   Relevant Medications   clopidogrel  (PLAVIX ) 75 MG tablet   lisinopril  (ZESTRIL ) 40 MG tablet   sildenafil  (REVATIO ) 20 MG tablet   Other Relevant Orders   Bayer DCA Hb A1c Waived   CBC with Differential/Platelet   CMP14+EGFR   Lipid panel     Endocrine   Type 2 diabetes mellitus with other specified complication (HCC) - Primary   Relevant Medications   lisinopril  (ZESTRIL ) 40 MG tablet   Other Relevant Orders   Bayer DCA Hb A1c Waived   CBC with Differential/Platelet   CMP14+EGFR   Lipid panel   Microalbumin/Creatinine Ratio, Urine     Other   Hypercholesterolemia   Relevant Medications   clopidogrel  (PLAVIX ) 75 MG tablet   lisinopril  (ZESTRIL ) 40 MG tablet   sildenafil  (REVATIO ) 20 MG tablet   Other Relevant Orders   Bayer DCA Hb A1c Waived   CBC with Differential/Platelet   CMP14+EGFR   Lipid panel   ED (erectile dysfunction)   Relevant Medications   sildenafil  (REVATIO ) 20 MG tablet   Other Visit Diagnoses       Encounter for smoking cessation counseling       Relevant Medications   varenicline  (CHANTIX ) 1 MG tablet     Depression, recurrent (HCC)       Relevant Medications   traZODone   (DESYREL ) 100 MG tablet   PARoxetine (PAXIL) 20 MG tablet       A1c is good at 5.2, blood pressure on the lower side so recommended that he back down to just 1 lisinopril  a day instead of 2.  Patient feeling more depressed, will start Paxil, also encouraged that he could see a counselor. Follow up plan: Return in about 3 months (around 08/23/2023), or if symptoms worsen or fail to improve, for Diabetes and hypertension and depression recheck.  Counseling provided for all of the vaccine components Orders Placed This Encounter  Procedures   Bayer DCA Hb A1c Waived   CBC with Differential/Platelet   CMP14+EGFR   Lipid panel   Microalbumin/Creatinine Ratio, Urine    Jolyne Needs, MD [facility] 05/23/2023, 8:48 AM

## 2023-05-24 LAB — CBC WITH DIFFERENTIAL/PLATELET
Basophils Absolute: 0 10*3/uL (ref 0.0–0.2)
Basos: 0 %
EOS (ABSOLUTE): 0.1 10*3/uL (ref 0.0–0.4)
Eos: 2 %
Hematocrit: 46 % (ref 37.5–51.0)
Hemoglobin: 15.1 g/dL (ref 13.0–17.7)
Immature Grans (Abs): 0 10*3/uL (ref 0.0–0.1)
Immature Granulocytes: 0 %
Lymphocytes Absolute: 2.3 10*3/uL (ref 0.7–3.1)
Lymphs: 42 %
MCH: 30.9 pg (ref 26.6–33.0)
MCHC: 32.8 g/dL (ref 31.5–35.7)
MCV: 94 fL (ref 79–97)
Monocytes Absolute: 0.6 10*3/uL (ref 0.1–0.9)
Monocytes: 11 %
Neutrophils Absolute: 2.5 10*3/uL (ref 1.4–7.0)
Neutrophils: 45 %
Platelets: 172 10*3/uL (ref 150–450)
RBC: 4.89 x10E6/uL (ref 4.14–5.80)
RDW: 12.1 % (ref 11.6–15.4)
WBC: 5.6 10*3/uL (ref 3.4–10.8)

## 2023-05-24 LAB — CMP14+EGFR
ALT: 27 IU/L (ref 0–44)
AST: 19 IU/L (ref 0–40)
Albumin: 4.2 g/dL (ref 4.1–5.1)
Alkaline Phosphatase: 52 IU/L (ref 44–121)
BUN/Creatinine Ratio: 13 (ref 9–20)
BUN: 13 mg/dL (ref 6–24)
Bilirubin Total: 0.5 mg/dL (ref 0.0–1.2)
CO2: 21 mmol/L (ref 20–29)
Calcium: 9.5 mg/dL (ref 8.7–10.2)
Chloride: 102 mmol/L (ref 96–106)
Creatinine, Ser: 1 mg/dL (ref 0.76–1.27)
Globulin, Total: 2.3 g/dL (ref 1.5–4.5)
Glucose: 81 mg/dL (ref 70–99)
Potassium: 4.7 mmol/L (ref 3.5–5.2)
Sodium: 140 mmol/L (ref 134–144)
Total Protein: 6.5 g/dL (ref 6.0–8.5)
eGFR: 94 mL/min/{1.73_m2} (ref >59)

## 2023-05-24 LAB — LIPID PANEL
Cholesterol, Total: 150 mg/dL (ref 100–199)
HDL: 27 mg/dL — ABNORMAL LOW (ref >39)
LDL CALC COMMENT:: 5.6 ratio — ABNORMAL HIGH (ref 0.0–5.0)
LDL Chol Calc (NIH): 95 mg/dL (ref 0–99)
Triglycerides: 161 mg/dL — ABNORMAL HIGH (ref 0–149)
VLDL Cholesterol Cal: 28 mg/dL (ref 5–40)

## 2023-05-30 ENCOUNTER — Ambulatory Visit: Payer: Self-pay | Admitting: Family Medicine

## 2023-08-28 ENCOUNTER — Encounter: Payer: Self-pay | Admitting: Family Medicine

## 2023-08-28 ENCOUNTER — Ambulatory Visit (INDEPENDENT_AMBULATORY_CARE_PROVIDER_SITE_OTHER): Payer: Self-pay | Admitting: Family Medicine

## 2023-08-28 VITALS — BP 115/70 | HR 55 | Temp 98.3°F | Ht 71.0 in | Wt 240.0 lb

## 2023-08-28 DIAGNOSIS — I10 Essential (primary) hypertension: Secondary | ICD-10-CM

## 2023-08-28 DIAGNOSIS — Z716 Tobacco abuse counseling: Secondary | ICD-10-CM

## 2023-08-28 DIAGNOSIS — E1169 Type 2 diabetes mellitus with other specified complication: Secondary | ICD-10-CM

## 2023-08-28 DIAGNOSIS — F339 Major depressive disorder, recurrent, unspecified: Secondary | ICD-10-CM

## 2023-08-28 DIAGNOSIS — F411 Generalized anxiety disorder: Secondary | ICD-10-CM

## 2023-08-28 DIAGNOSIS — E7800 Pure hypercholesterolemia, unspecified: Secondary | ICD-10-CM

## 2023-08-28 LAB — BAYER DCA HB A1C WAIVED: HB A1C (BAYER DCA - WAIVED): 5.2 % (ref 4.8–5.6)

## 2023-08-28 MED ORDER — VARENICLINE TARTRATE 1 MG PO TABS: 1.0000 mg | ORAL_TABLET | Freq: Two times a day (BID) | ORAL | 2 refills | Status: DC

## 2023-08-28 MED ORDER — PAROXETINE HCL 40 MG PO TABS: 40.0000 mg | ORAL_TABLET | Freq: Every day | ORAL | 1 refills | Status: AC

## 2023-08-28 MED ORDER — VARENICLINE TARTRATE (STARTER) 0.5 MG X 11 & 1 MG X 42 PO TBPK: ORAL_TABLET | ORAL | 0 refills | Status: AC

## 2023-08-28 NOTE — Progress Notes (Signed)
 BP 115/70   Pulse (!) 55   Temp 98.3 F (36.8 C)   Ht 5' 11 (1.803 m)   Wt 240 lb (108.9 kg)   SpO2 97%   BMI 33.47 kg/m    Subjective:   Patient ID: Jason Morris, male    DOB: 08-Sep-1975, 48 y.o.   MRN: [id number]  HPI: Jason Morris is a 48 y.o. male presenting on 08/28/2023 for Medical Management of Chronic Issues   Discussed the use of AI scribe software for clinical note transcription with the patient, who gave verbal consent to proceed.  History of Present Illness   Jason Morris is a 48 year old male with type 2 diabetes, hypercholesterolemia, hypertension, and anxiety who presents for a recheck of these conditions.  He has type 2 diabetes and was previously on Mounjaro, which he stopped over a month ago due to high cost and availability issues. Since discontinuing Mounjaro, he has noticed a slight weight gain but is maintaining physical activity by delivering sheds.  His hypertension is managed with lisinopril , and he reports no issues with this medication.  For hypercholesterolemia, he is taking rosuvastatin  (Crestor ) 20 mg daily. He increased the dose from half a pill to a full pill three months ago after receiving his results.  Regarding his anxiety, he is on Paxil  and initially experienced significant mood changes when starting the medication. He stopped taking Chantix  to ensure there was no interaction. His mood has since stabilized, but he still experiences anxiety and depression.  He also takes omeprazole  for reflux, which he finds effective, and prefers to purchase it over-the-counter due to cost.  He previously used Chantix  for smoking cessation and found the generic version affordable.           08/28/2023    8:27 AM 05/23/2023    8:04 AM 11/23/2022    8:27 AM 08/17/2022    8:31 AM 06/20/2022    8:05 AM  Depression screen PHQ 2/9  Decreased Interest 3 3 0 3 0  Down, Depressed, Hopeless 2 3 0 3 0  PHQ - 2 Score 5 6 0 6 0  Altered sleeping 3 3 0 3 0   Tired, decreased energy 3 3 0 2 0  Change in appetite 3 3 0 3 0  Feeling bad or failure about yourself  3 3 0 0 0  Trouble concentrating 3 3 0 2 0  Moving slowly or fidgety/restless 0 3 0 1 0  Suicidal thoughts 0 0 0 0 0  PHQ-9 Score 20 24 0 17 0  Difficult doing work/chores Very difficult Extremely dIfficult Not difficult at all Extremely dIfficult Not difficult at all       08/28/2023    8:28 AM 05/23/2023    8:04 AM 11/23/2022    8:27 AM 08/17/2022    8:31 AM  GAD 7 : Generalized Anxiety Score  Nervous, Anxious, on Edge 3 0 0 3  Control/stop worrying 3 3 0 3  Worry too much - different things 3 3 0 3  Trouble relaxing 3 3 0 3  Restless 2 3 0 3  Easily annoyed or irritable 3 3 0 3  Afraid - awful might happen 0 0 0 3  Total GAD 7 Score 17 15 0 21  Anxiety Difficulty Very difficult  Not difficult at all Somewhat difficult      Relevant past medical, surgical, family and social history reviewed and updated as indicated. Interim medical history since our  last visit reviewed. Allergies and medications reviewed and updated.  Review of Systems  Constitutional:  Negative for chills and fever.  Respiratory:  Negative for shortness of breath and wheezing.   Cardiovascular:  Negative for chest pain and leg swelling.  Skin:  Negative for rash.  Psychiatric/Behavioral:  Positive for dysphoric mood and sleep disturbance. Negative for self-injury and suicidal ideas. The patient is nervous/anxious.   All other systems reviewed and are negative.   Per HPI unless specifically indicated above   Allergies as of 08/28/2023       Reactions   Lipitor [atorvastatin ] Hives   Lopressor  [metoprolol  Tartrate] Shortness Of Breath   Felt like he was going to die , could not breath & syncope   Egg-derived Products Other (See Comments)   Really bad stomach pain   Lactose Intolerance (gi) Diarrhea        Medication List        Accurate as of August 28, 2023  8:52 AM. If you have any  questions, ask your nurse or doctor.          clopidogrel  75 MG tablet Commonly known as: PLAVIX  Take 1 tablet (75 mg total) by mouth daily.   lisinopril  40 MG tablet Commonly known as: ZESTRIL  Take 1 tablet (40 mg total) by mouth daily.   multivitamin tablet Take 1 tablet by mouth daily.   nabumetone  500 MG tablet Commonly known as: RELAFEN  Take 2 tablets (1,000 mg total) by mouth 2 (two) times daily. For muscle and joint pain   omeprazole  20 MG capsule Commonly known as: PRILOSEC Take 1 capsule (20 mg total) by mouth daily.   PARoxetine  40 MG tablet Commonly known as: PAXIL  Take 1 tablet (40 mg total) by mouth daily. What changed:  medication strength how much to take Changed by: Fonda LABOR Lucely Leard   rosuvastatin  20 MG tablet Commonly known as: CRESTOR  Take 1 tablet (20 mg total) by mouth daily.   sildenafil  20 MG tablet Commonly known as: REVATIO  Take 1-5 tablets (20-100 mg total) by mouth as needed.   tirzepatide 7.5 MG/0.5ML Pen Commonly known as: MOUNJARO Inject 7.5 mg into the skin once a week.   traZODone  100 MG tablet Commonly known as: DESYREL  Take 0.5-1 tablets (50-100 mg total) by mouth at bedtime as needed for sleep.   Varenicline  Tartrate (Starter) 0.5 MG X 11 & 1 MG X 42 Tbpk Commonly known as: Chantix  Starting Month Pak Take 0.5 mg by mouth daily for 3 days, THEN 0.5 mg 2 (two) times daily for 4 days, THEN 1 mg 2 (two) times daily for 23 days. Start taking on: August 28, 2023 What changed: You were already taking a medication with the same name, and this prescription was added. Make sure you understand how and when to take each. Changed by: Fonda LABOR Shafer Swamy   varenicline  1 MG tablet Commonly known as: CHANTIX  Take 1 tablet (1 mg total) by mouth 2 (two) times daily. What changed: Another medication with the same name was added. Make sure you understand how and when to take each. Changed by: Fonda LABOR Axavier Pressley         Objective:    BP 115/70   Pulse (!) 55   Temp 98.3 F (36.8 C)   Ht 5' 11 (1.803 m)   Wt 240 lb (108.9 kg)   SpO2 97%   BMI 33.47 kg/m   Wt Readings from Last 3 Encounters:  08/28/23 240 lb (108.9 kg)  05/23/23 225 lb (  102.1 kg)  11/23/22 222 lb (100.7 kg)    Physical Exam Physical Exam   VITALS: BP- 115/70 CHEST: Lungs clear to auscultation bilaterally. CARDIOVASCULAR: Heart regular rate and rhythm, no murmurs. ABDOMEN: No costovertebral angle tenderness. EXTREMITIES: No edema, good peripheral pulses.       Diabetes  Assessment & Plan:   Problem List Items Addressed This Visit       Cardiovascular and Mediastinum   Essential hypertension     Endocrine   Type 2 diabetes mellitus with other specified complication (HCC) - Primary   Relevant Orders   Microalbumin / creatinine urine ratio   Bayer DCA Hb A1c Waived     Other   Hypercholesterolemia   Relevant Orders   Lipid panel   GAD (generalized anxiety disorder)   Relevant Medications   PARoxetine  (PAXIL ) 40 MG tablet   Other Visit Diagnoses       Depression, recurrent (HCC)       Relevant Medications   PARoxetine  (PAXIL ) 40 MG tablet     Encounter for smoking cessation counseling       Relevant Medications   Varenicline  Tartrate, Starter, (CHANTIX  STARTING MONTH PAK) 0.5 MG X 11 & 1 MG X 42 TBPK   varenicline  (CHANTIX ) 1 MG tablet           Type 2 diabetes mellitus Previously well-controlled with tirzepatide, discontinued due to cost and availability. - Discussed alternative options for tirzepatide, including online pharmacies offering compounded versions at lower cost.  Essential hypertension Well-controlled with lisinopril . Current blood pressure is 115/70 mmHg.  Pure hypercholesterolemia Managed with rosuvastatin  20 mg daily. Dose increased three months ago. - Order lipid panel to assess cholesterol levels post dose adjustment.  Anxiety disorder and depressive disorder Symptoms manageable on  paroxetine . Desires dosage increase for better symptom management. - Increase paroxetine  to 40 mg daily. Use current supply by taking two tablets of current dose until new prescription is filled.  Gastroesophageal reflux disease (GERD) Symptoms well-controlled with over-the-counter omeprazole .  Nicotine  dependence Previously managed with varenicline , discontinued due to concerns about interaction with Paxil . No interaction expected. - Restart varenicline  using a starter pack to taper up dosage.          Follow up plan: Return in about 3 months (around 11/28/2023), or if symptoms worsen or fail to improve, for Hypertension and hyperlipidemia and diabetes and anxiety.  Counseling provided for all of the vaccine components Orders Placed This Encounter  Procedures   Microalbumin / creatinine urine ratio   Bayer DCA Hb A1c Waived   Lipid panel    Fonda Levins, MD [facility] 08/28/2023, 8:52 AM

## 2023-08-29 LAB — MICROALBUMIN / CREATININE URINE RATIO
Creatinine, Urine: 53 mg/dL
Microalb/Creat Ratio: 20 mg/g{creat} (ref 0–29)
Microalbumin, Urine: 10.7 ug/mL

## 2023-08-29 LAB — LIPID PANEL
Chol/HDL Ratio: 4.4 ratio (ref 0.0–5.0)
Cholesterol, Total: 151 mg/dL (ref 100–199)
HDL: 34 mg/dL — ABNORMAL LOW (ref >39)
LDL Chol Calc (NIH): 103 mg/dL — ABNORMAL HIGH (ref 0–99)
Triglycerides: 69 mg/dL (ref 0–149)
VLDL Cholesterol Cal: 14 mg/dL (ref 5–40)

## 2023-09-05 ENCOUNTER — Telehealth: Payer: Self-pay

## 2023-09-05 ENCOUNTER — Ambulatory Visit: Payer: Self-pay | Admitting: Family Medicine

## 2023-09-05 NOTE — Telephone Encounter (Signed)
 Copied from CRM [id number]. Topic: Clinical - Request for Lab/Test Order >> Sep 05, 2023  9:45 AM Turkey B wrote: Reason for CRM: gave patient lab results

## 2023-09-05 NOTE — Telephone Encounter (Signed)
 Result encounter updated

## 2023-11-25 ENCOUNTER — Ambulatory Visit: Payer: Self-pay

## 2023-11-25 ENCOUNTER — Telehealth: Payer: Self-pay

## 2023-11-25 ENCOUNTER — Other Ambulatory Visit: Payer: Self-pay

## 2023-11-25 ENCOUNTER — Encounter (HOSPITAL_BASED_OUTPATIENT_CLINIC_OR_DEPARTMENT_OTHER): Payer: Self-pay | Admitting: Emergency Medicine

## 2023-11-25 ENCOUNTER — Emergency Department (HOSPITAL_BASED_OUTPATIENT_CLINIC_OR_DEPARTMENT_OTHER): Payer: Self-pay | Admitting: Radiology

## 2023-11-25 ENCOUNTER — Emergency Department (HOSPITAL_BASED_OUTPATIENT_CLINIC_OR_DEPARTMENT_OTHER)
Admission: EM | Admit: 2023-11-25 | Discharge: 2023-11-25 | Disposition: A | Discharge status: Home / Self Care | Payer: Self-pay | Attending: Emergency Medicine | Admitting: Emergency Medicine

## 2023-11-25 DIAGNOSIS — K219 Gastro-esophageal reflux disease without esophagitis: Secondary | ICD-10-CM | POA: Insufficient documentation

## 2023-11-25 DIAGNOSIS — I10 Essential (primary) hypertension: Secondary | ICD-10-CM | POA: Insufficient documentation

## 2023-11-25 DIAGNOSIS — I2510 Atherosclerotic heart disease of native coronary artery without angina pectoris: Secondary | ICD-10-CM | POA: Insufficient documentation

## 2023-11-25 DIAGNOSIS — Z7902 Long term (current) use of antithrombotics/antiplatelets: Secondary | ICD-10-CM | POA: Insufficient documentation

## 2023-11-25 DIAGNOSIS — Z79899 Other long term (current) drug therapy: Secondary | ICD-10-CM | POA: Insufficient documentation

## 2023-11-25 LAB — CBC
HCT: 45.6 % (ref 39.0–52.0)
Hemoglobin: 15.5 g/dL (ref 13.0–17.0)
MCH: 30.9 pg (ref 26.0–34.0)
MCHC: 34 g/dL (ref 30.0–36.0)
MCV: 91 fL (ref 80.0–100.0)
Platelets: 172 K/uL (ref 150–400)
RBC: 5.01 MIL/uL (ref 4.22–5.81)
RDW: 12 % (ref 11.5–15.5)
WBC: 5.3 K/uL (ref 4.0–10.5)
nRBC: 0 % (ref 0.0–0.2)

## 2023-11-25 LAB — BASIC METABOLIC PANEL WITH GFR
Anion gap: 11 (ref 5–15)
BUN: 11 mg/dL (ref 6–20)
CO2: 23 mmol/L (ref 22–32)
Calcium: 10.2 mg/dL (ref 8.9–10.3)
Chloride: 103 mmol/L (ref 98–111)
Creatinine, Ser: 0.93 mg/dL (ref 0.61–1.24)
GFR, Estimated: >60 mL/min (ref >60)
Glucose, Bld: 102 mg/dL — ABNORMAL HIGH (ref 70–99)
Potassium: 4.3 mmol/L (ref 3.5–5.1)
Sodium: 138 mmol/L (ref 135–145)

## 2023-11-25 LAB — TROPONIN T, HIGH SENSITIVITY: Troponin T High Sensitivity: <15 ng/L (ref 0–19)

## 2023-11-25 MED ORDER — ALUM & MAG HYDROXIDE-SIMETH 200-200-20 MG/5ML PO SUSP
30.0000 mL | Freq: Once | ORAL | Status: AC
Start: 2023-11-25 — End: 2023-11-25
  Administered 2023-11-25: 30 mL via ORAL
  Filled 2023-11-25: qty 30

## 2023-11-25 MED ORDER — OMEPRAZOLE 40 MG PO CPDR: 40.0000 mg | DELAYED_RELEASE_CAPSULE | Freq: Every day | ORAL | 0 refills | Status: DC

## 2023-11-25 MED ORDER — PANTOPRAZOLE SODIUM 40 MG PO TBEC
40.0000 mg | DELAYED_RELEASE_TABLET | Freq: Once | ORAL | Status: AC
  Administered 2023-11-25: 40 mg via ORAL
  Filled 2023-11-25: qty 1

## 2023-11-25 NOTE — ED Triage Notes (Signed)
 Pt caox4 ambulatory c/o CP radiating to L arm intermittent x3-4 days, denies SOB, N/V, dizziness with CP. Denies CP at present.

## 2023-11-25 NOTE — Telephone Encounter (Addendum)
 FYI Only or Action Required?: FYI only for provider: ED advised. Refusing at this time.   Patient was last seen in primary care on 08/28/2023 by Dettinger, Fonda LABOR, MD.  Called Nurse Triage reporting Chest Pain.  Symptoms began several days ago.  Triage Disposition: Go to ED Now (Notify PCP)  Patient/caregiver understands and will follow disposition?: No, wishes to speak with PCP  CAL contacted.   Copied from CRM #[id number]. Topic: Clinical - Red Word Triage >> Nov 25, 2023 11:18 AM Delon HERO wrote: Red Word that prompted transfer to Nurse Triage: Patient is calling to report chest pressure/tightness for 3-4 days. That goes into his left arm. Alleviates by drinking ice. Get the pain multiple times through out the day. Reason for Disposition  Pain also in shoulder(s) or arm(s) or jaw  (Exception: Pain is clearly made worse by movement.)  Answer Assessment - Initial Assessment Questions 2. RADIATION: Does the pain go anywhere else? (e.g., into neck, jaw, arms, back)     L arm, has had into R arm and throat, states feels like mouth is full of pepper.  3. ONSET: When did the chest pain begin? (Minutes, hours or days)      3-4 days 7. CARDIAC RISK FACTORS: Do you have any history of heart problems or risk factors for heart disease? (e.g., angina, prior heart attack; diabetes, high blood pressure, high cholesterol, smoker, or strong family history of heart disease)     Denies cardiac hx but states he is on plavix  d/t coronary artery issue 10. OTHER SYMPTOMS: Do you have any other symptoms? (e.g., dizziness, nausea, vomiting, sweating, fever, difficulty breathing, cough)       Denies difficulty breathing, denies SOB  Burning, radiates into L arm. States the L arm feels like it is being squeezed off. Pt states at times it is in his R arm and into neck.  States that pain is relieved by chewing ice. +diaphoresis with sugar dropping, denies other diaphoresis. Pt advised to go to ED,  states that he is unsure if he will go to ED d/t work. Requesting appt with PCP, again advised that ED is recommended.  Protocols used: Chest Pain-A-AH

## 2023-11-25 NOTE — Telephone Encounter (Signed)
 Copied from CRM #[id number]. Topic: Clinical - Medication Question >> Nov 25, 2023  3:11 PM Selinda RAMAN wrote: Reason for CRM: The patient called in stating he went to [facility] ER and was seen there for his acid reflux and to rule out anything heart related. He was cleared of anything heart related and was prescribed omeprazole  (PRILOSEC) 40 MG capsule but he hasn't picked it. He wants to know if there is anything his provider would recommend that would be even stronger than that even if it is in liquid form or over the counter. Please assist patient further

## 2023-11-25 NOTE — Telephone Encounter (Signed)
 Patient called back to speak with previous triage nurse. After this clinical research associate verified patient name and dob call appears to have disconnected. Attempt to reach patient back but received voicemail and mailbox has not been set up. Placing in call back folder for further attempts to reach patient.

## 2023-11-25 NOTE — Telephone Encounter (Signed)
 Chest pain for few days.  Chewing ice helps and swallowing. Throat and chest burns. He did double up on the omeprazole .  Squeezing in left arm at times.  Advised pt that the best option is to go to ER. Especially if doubling Omeprazole  does not help.  Pt denies, HA, N&V, sweating, jaw pain.  While on the phone with the pt he states that he is pulling into the ER at [facility].

## 2023-11-25 NOTE — ED Provider Notes (Signed)
 [place] EMERGENCY DEPARTMENT AT [facility] Provider Note   CSN: [id number] Arrival date & time: 11/25/23  1201     Patient presents with: Chest Pain   Jason Morris is a 48 y.o. male with past medical history of hypertension, hyperlipidemia, coronary artery disease, prediabetes who presents emergency department for evaluation of chest pain.  Patient states he has had centralized chest and epigastric pain that radiates to his left arm for the last 4-5 days.  Patient states bending forward exacerbates his pain.  He describes the pain in his arm as a squeezing sensation.  He does not notice any correlation with the pain or worsening with eating or drinking.  He denies nausea, abdominal pain.  No shortness of breath.  Per chart review, patient does have a history of NSTEMI and a left heart cath.  He does take Plavix  daily.  Patient reports he has had a GI cocktail in the past and it has helped his symptoms.  He denies lower extremity swelling.    Chest Pain      Prior to Admission medications   Medication Sig Start Date End Date Taking? Authorizing Provider  omeprazole  (PRILOSEC) 40 MG capsule Take 1 capsule (40 mg total) by mouth daily. 11/25/23  Yes Lovette Merta, Marry RAMAN, PA-C  clopidogrel  (PLAVIX ) 75 MG tablet Take 1 tablet (75 mg total) by mouth daily. 05/23/23   Dettinger, Fonda LABOR, MD  lisinopril  (ZESTRIL ) 40 MG tablet Take 1 tablet (40 mg total) by mouth daily. 05/23/23   Dettinger, Fonda LABOR, MD  Multiple Vitamin (MULTIVITAMIN) tablet Take 1 tablet by mouth daily.    [provider]  nabumetone  (RELAFEN ) 500 MG tablet Take 2 tablets (1,000 mg total) by mouth 2 (two) times daily. For muscle and joint pain 11/23/22   Dettinger, Fonda LABOR, MD  PARoxetine  (PAXIL ) 40 MG tablet Take 1 tablet (40 mg total) by mouth daily. 08/28/23   Dettinger, Fonda LABOR, MD  rosuvastatin  (CRESTOR ) 20 MG tablet Take 1 tablet (20 mg total) by mouth daily. 11/23/22   Dettinger, Fonda LABOR, MD   sildenafil  (REVATIO ) 20 MG tablet Take 1-5 tablets (20-100 mg total) by mouth as needed. 05/23/23   Dettinger, Fonda LABOR, MD  tirzepatide [facility]) 7.5 MG/0.5ML Pen Inject 7.5 mg into the skin once a week.    [provider]  traZODone  (DESYREL ) 100 MG tablet Take 0.5-1 tablets (50-100 mg total) by mouth at bedtime as needed for sleep. 05/23/23   Dettinger, Fonda LABOR, MD  varenicline  (CHANTIX ) 1 MG tablet Take 1 tablet (1 mg total) by mouth 2 (two) times daily. 08/28/23   Dettinger, Fonda LABOR, MD  atorvastatin  (LIPITOR) 80 MG tablet Take 1 tablet (80 mg total) by mouth daily at 6 PM. 08/09/12 08/17/12  Lelon Hamilton T, PA-C    Allergies: Lipitor [atorvastatin ], Lopressor  [metoprolol  tartrate], Egg protein-containing drug products, and Lactose intolerance (gi)    Review of Systems  Cardiovascular:  Positive for chest pain.    Updated Vital Signs BP (!) 132/93   Pulse 87   Temp (!) 97.3 F (36.3 C)   Resp 16   Ht 5' 11 (1.803 m)   Wt 93.4 kg   SpO2 100%   BMI 28.73 kg/m   Physical Exam Vitals and nursing note reviewed.  Constitutional:      Appearance: Normal appearance.  HENT:     Head: Normocephalic and atraumatic.     Mouth/Throat:     Mouth: Mucous membranes are moist.  Eyes:  General: No scleral icterus.       Right eye: No discharge.        Left eye: No discharge.     Conjunctiva/sclera: Conjunctivae normal.  Cardiovascular:     Rate and Rhythm: Normal rate and regular rhythm.     Pulses: Normal pulses.          Dorsalis pedis pulses are 2+ on the right side and 2+ on the left side.  Pulmonary:     Effort: Pulmonary effort is normal.     Breath sounds: Normal breath sounds.     Comments: No acute distress. Abdominal:     General: There is no distension.     Palpations: Abdomen is soft.     Tenderness: There is no abdominal tenderness.     Comments: Abdomen is nontender in all 4 quadrants and epigastrium.  Musculoskeletal:        General: No deformity.      Cervical back: Normal range of motion.  Skin:    General: Skin is warm and dry.     Capillary Refill: Capillary refill takes less than 2 seconds.  Neurological:     Mental Status: He is alert.     Motor: No weakness.  Psychiatric:        Mood and Affect: Mood normal.     (all labs ordered are listed, but only abnormal results are displayed) Labs Reviewed  BASIC METABOLIC PANEL WITH GFR - Abnormal; Notable for the following components:      Result Value   Glucose, Bld 102 (*)    All other components within normal limits  CBC  TROPONIN T, HIGH SENSITIVITY  TROPONIN T, HIGH SENSITIVITY    EKG: EKG Interpretation Date/Time:  Monday November 25 2023 12:13:26 EST Ventricular Rate:  78 PR Interval:  132 QRS Duration:  96 QT Interval:  374 QTC Calculation: 426 R Axis:   86  Text Interpretation: Normal sinus rhythm Normal ECG When compared with ECG of 13-Jul-2020 15:42, No significant change was found Confirmed by Ruthe Cornet [id number]) on 11/25/2023 12:16:39 PM  Radiology: [facility] Chest 2 View Result Date: 11/25/2023 EXAM: 2 VIEW(S) XRAY OF THE CHEST 11/25/2023 12:46:23 PM COMPARISON: 07/13/2020 CLINICAL HISTORY: Chest pain extending into the left arm intermittently over the last 3 days. FINDINGS: LUNGS AND PLEURA: Left lateral costophrenic angle omitted on the frontal projection. Otherwise, the lungs appear clear. No focal pulmonary opacity. No pleural effusion. No pneumothorax. HEART AND MEDIASTINUM: No acute abnormality of the cardiac and mediastinal silhouettes. BONES AND SOFT TISSUES: Thoracic spondylosis. IMPRESSION: 1. No acute cardiopulmonary findings to explain chest pain. 2. Thoracic spondylosis. Electronically signed by: Ryan Salvage MD 11/25/2023 01:44 PM EST RP Workstation: [id number]    Procedures   Medications Ordered in the ED  alum & mag hydroxide-simeth (MAALOX/MYLANTA) 200-200-20 MG/5ML suspension 30 mL (30 mLs Oral Given 11/25/23 1329)  pantoprazole   (PROTONIX ) EC tablet 40 mg (40 mg Oral Given 11/25/23 1328)                                    Medical Decision Making Amount and/or Complexity of Data Reviewed Labs: ordered. Radiology: ordered.  Risk OTC drugs. Prescription drug management.   This patient presents to the ED for concern of chest pain, this involves an extensive number of treatment options, and is a complaint that carries with it a high risk of complications and morbidity.   Differential  diagnosis includes: ACS, GERD, pulmonary embolism, pulmonary hypertension, new onset heart failure, COPD  Co morbidities:  hypertension, hyperlipidemia, prediabetes, history of NSTEMI  Additional history: Per chart review, patient has a history of NSTEMI and left heart cath placement in 2016.  Lab Tests:  I Ordered, and personally interpreted labs.  The pertinent results include: No acute abnormalities  Imaging Studies:  I ordered imaging studies including chest x-ray I independently visualized and interpreted imaging which showed no acute cardiopulmonary findings I agree with the radiologist interpretation  Cardiac Monitoring/ECG:  The patient was maintained on a cardiac monitor.  I personally viewed and interpreted the cardiac monitored which showed an underlying rhythm of: Normal sinus rhythm  Medicines ordered and prescription drug management:  I ordered medication including  Medications  alum & mag hydroxide-simeth (MAALOX/MYLANTA) 200-200-20 MG/5ML suspension 30 mL (30 mLs Oral Given 11/25/23 1329)  pantoprazole  (PROTONIX ) EC tablet 40 mg (40 mg Oral Given 11/25/23 1328)   for symptoms related to GERD Reevaluation of the patient after these medicines showed that the patient improved I have reviewed the patients home medicines and have made adjustments as needed  Test Considered:   none  Critical Interventions:   none  Consultations Obtained: None  Problem List / ED Course:     ICD-10-CM   1. Chest  pain due to GERD  K21.9    R07.9       MDM: 48 year old male who presents emergency department for evaluation of chest pain and left arm pain.  Patient's pain has been present for the last 4-5 days.  Patient reports centralized chest and epigastric pain and describes the arm pain as a squeezing pain.  No shortness of breath, nausea, vomiting.  Patient's chest pain is worsened by bending forward.  No association in symptoms with eating/drinking.  EKG shows sinus rhythm.  Initial troponin shows less than 15.  Second troponin not indicated given duration of patient's symptoms.  Other labs are unremarkable.  Chest x-ray shows no acute cardiopulmonary abnormality to explain patient's symptoms.  I gave patient a GI cocktail, based on his symptom presentation.  Upon reevaluation, patient reports his symptoms have improved.  Patient is currently on daily 20mg  omeprazole  so I have opted to increase this to 40 mg.  I have also educated patient on dietary changes that he can make to help alleviate symptoms of reflux.  I encourage patient to follow-up with his PCP within the next month or sooner if patient symptoms do not improve.  Patient verbalizes understanding to this.  Vital signs are stable.  Patient is appropriate for discharge at this time.   Dispostion:  After consideration of the diagnostic results and the patients response to treatment, I feel that the patient would benefit from increasing his omeprazole  dosage and PCP follow-up.   Final diagnoses:  Chest pain due to GERD    ED Discharge Orders          Ordered    omeprazole  (PRILOSEC) 40 MG capsule  Daily        11/25/23 1406               Torrence Marry RAMAN, PA-C 11/25/23 1413    Ruthe Cornet, DO 11/25/23 1418

## 2023-11-25 NOTE — Discharge Instructions (Addendum)
 It was a pleasure taking care of you today. You were seen in the Emergency Department for chest pain. Your work-up was reassuring. Your CT/Xray/Labs showed no evidence of cardiopulmonary abnormality.  I suspect your symptoms are likely secondary to acid reflux.  As we discussed, I am increasing your omeprazole  dose to 40 mg daily. refer to the attached documentation for further management of your symptoms.  I am including some information on dietary changes that may help with your symptoms.  Follow up with your family medicine provider within the next month, or sooner if your symptoms continue or worsen.  Please return to the ER if you experience chest pain, trouble breathing, intractable nausea/vomiting or any other life threatening illnesses.

## 2023-11-25 NOTE — Telephone Encounter (Signed)
 I would not necessarily say something stronger or weaker but just different, if he was tried the omeprazole  40 mg and it does not work as well then I would try Nexium 40 mg, they are similar but some people feel like Nexium is stronger and some people feel like omeprazole  stronger but either is an option.  Nexium is over-the-counter if he wants to purchase that as well.

## 2023-11-25 NOTE — Telephone Encounter (Signed)
 Pt made aware. He will purchase Nexium 40mg  OTC tonight. Will call back If needed.

## 2023-11-29 ENCOUNTER — Encounter: Payer: Self-pay | Admitting: Family Medicine

## 2023-11-29 ENCOUNTER — Ambulatory Visit: Payer: Self-pay | Admitting: Family Medicine

## 2023-11-29 VITALS — BP 93/55 | HR 56 | Temp 96.9°F | Ht 71.0 in | Wt 216.4 lb

## 2023-11-29 DIAGNOSIS — Z716 Tobacco abuse counseling: Secondary | ICD-10-CM

## 2023-11-29 DIAGNOSIS — E66811 Obesity, class 1: Secondary | ICD-10-CM

## 2023-11-29 DIAGNOSIS — Z7985 Long-term (current) use of injectable non-insulin antidiabetic drugs: Secondary | ICD-10-CM

## 2023-11-29 DIAGNOSIS — I152 Hypertension secondary to endocrine disorders: Secondary | ICD-10-CM

## 2023-11-29 DIAGNOSIS — E1169 Type 2 diabetes mellitus with other specified complication: Secondary | ICD-10-CM

## 2023-11-29 DIAGNOSIS — E1159 Type 2 diabetes mellitus with other circulatory complications: Secondary | ICD-10-CM

## 2023-11-29 DIAGNOSIS — K2100 Gastro-esophageal reflux disease with esophagitis, without bleeding: Secondary | ICD-10-CM

## 2023-11-29 DIAGNOSIS — E785 Hyperlipidemia, unspecified: Secondary | ICD-10-CM

## 2023-11-29 MED ORDER — ESOMEPRAZOLE MAGNESIUM 40 MG PO CPDR: 40.0000 mg | DELAYED_RELEASE_CAPSULE | Freq: Every day | ORAL | 3 refills | Status: DC

## 2023-11-29 MED ORDER — VARENICLINE TARTRATE 1 MG PO TABS: 1.0000 mg | ORAL_TABLET | Freq: Two times a day (BID) | ORAL | 2 refills | Status: AC

## 2023-11-29 NOTE — Progress Notes (Signed)
 BP (!) 93/55   Pulse (!) 56   Temp (!) 96.9 F (36.1 C)   Ht 5' 11 (1.803 m)   Wt 216 lb 6.4 oz (98.2 kg)   SpO2 99%   BMI 30.18 kg/m    Subjective:   Patient ID: Jason Morris, male    DOB: [birth year], 48 y.o.   MRN: [id number]  HPI: Jason Morris is a 48 y.o. male presenting on 11/29/2023 for Medical Management of Chronic Issues   Discussed the use of AI scribe software for clinical note transcription with the patient, who gave verbal consent to proceed.  History of Present Illness   Jason Morris is a 48 year old male with type 2 diabetes and hypertension who presents for a follow-up visit.  Hypoglycemia - Episodes of hypoglycemia with blood glucose levels dropping to the 40s and 50s almost daily - Hypoglycemic events began after resuming tirzepatide (Mounjaro) at a 7.5 mg dose from [facility], a compound pharmacy - Episodes managed by consuming orange juice and Slim Jims - Recent hospital visit at [facility] due to concerns about glucose levels; informed that glucose was high but did not receive a clear explanation for symptoms  Glycemic control - Type 2 diabetes mellitus - No recent changes in weight apart from intentional weight loss - No recent changes in diet or exercise routine  Hypertension - Monitors blood pressure at home with readings around 130/80 mmHg  Gastroesophageal reflux symptoms - Heartburn present - Previously on omeprazole  20 mg, increased to 40 mg - Recently switched to over-the-counter Nexium  (esomeprazole ) 20 mg, taking two pills to achieve a 40 mg dose, which is effective - Past use of Dexilant for similar symptoms, but no longer has access to samples          Relevant past medical, surgical, family and social history reviewed and updated as indicated. Interim medical history since our last visit reviewed. Allergies and medications reviewed and updated.  Review of Systems  Constitutional:  Negative for chills and fever.  Eyes:   Negative for visual disturbance.  Respiratory:  Negative for shortness of breath and wheezing.   Cardiovascular:  Negative for chest pain and leg swelling.  Musculoskeletal:  Negative for back pain and gait problem.  Skin:  Negative for rash.  Neurological:  Negative for dizziness and numbness.  All other systems reviewed and are negative.   Per HPI unless specifically indicated above   Allergies as of 11/29/2023       Reactions   Lipitor [atorvastatin ] Hives   Lopressor  [metoprolol  Tartrate] Shortness Of Breath   Felt like he was going to die , could not breath & syncope   Egg Protein-containing Drug Products Other (See Comments)   Really bad stomach pain   Lactose Intolerance (gi) Diarrhea        Medication List        Accurate as of November 29, 2023 11:49 AM. If you have any questions, ask your nurse or doctor.          STOP taking these medications    omeprazole  40 MG capsule Commonly known as: PRILOSEC Stopped by: Fonda LABOR Lyndell Allaire       TAKE these medications    clopidogrel  75 MG tablet Commonly known as: PLAVIX  Take 1 tablet (75 mg total) by mouth daily.   esomeprazole  40 MG capsule Commonly known as: NexIUM  Take 1 capsule (40 mg total) by mouth daily. Started by: Fonda LABOR Kaevon Cotta   lisinopril   40 MG tablet Commonly known as: ZESTRIL  Take 1 tablet (40 mg total) by mouth daily.   multivitamin tablet Take 1 tablet by mouth daily.   nabumetone  500 MG tablet Commonly known as: RELAFEN  Take 2 tablets (1,000 mg total) by mouth 2 (two) times daily. For muscle and joint pain   PARoxetine  40 MG tablet Commonly known as: PAXIL  Take 1 tablet (40 mg total) by mouth daily.   rosuvastatin  20 MG tablet Commonly known as: CRESTOR  Take 1 tablet (20 mg total) by mouth daily.   sildenafil  20 MG tablet Commonly known as: REVATIO  Take 1-5 tablets (20-100 mg total) by mouth as needed.   tirzepatide 7.5 MG/0.5ML Pen Commonly known as:  MOUNJARO Inject 7.5 mg into the skin once a week.   traZODone  100 MG tablet Commonly known as: DESYREL  Take 0.5-1 tablets (50-100 mg total) by mouth at bedtime as needed for sleep.   varenicline  1 MG tablet Commonly known as: CHANTIX  Take 1 tablet (1 mg total) by mouth 2 (two) times daily.         Objective:   BP (!) 93/55   Pulse (!) 56   Temp (!) 96.9 F (36.1 C)   Ht 5' 11 (1.803 m)   Wt 216 lb 6.4 oz (98.2 kg)   SpO2 99%   BMI 30.18 kg/m   Wt Readings from Last 3 Encounters:  11/29/23 216 lb 6.4 oz (98.2 kg)  11/25/23 206 lb (93.4 kg)  08/28/23 240 lb (108.9 kg)    Physical Exam Vitals and nursing note reviewed.  Constitutional:      General: He is not in acute distress.    Appearance: He is well-developed. He is not diaphoretic.  Eyes:     General: No scleral icterus.    Conjunctiva/sclera: Conjunctivae normal.  Neck:     Thyroid : No thyromegaly.  Cardiovascular:     Rate and Rhythm: Normal rate and regular rhythm.     Heart sounds: Normal heart sounds. No murmur heard. Pulmonary:     Effort: Pulmonary effort is normal. No respiratory distress.     Breath sounds: Normal breath sounds. No wheezing.  Musculoskeletal:        General: No swelling. Normal range of motion.     Cervical back: Neck supple.  Lymphadenopathy:     Cervical: No cervical adenopathy.  Skin:    General: Skin is warm and dry.     Findings: No rash.  Neurological:     Mental Status: He is alert and oriented to person, place, and time.     Coordination: Coordination normal.  Psychiatric:        Behavior: Behavior normal.    Physical Exam   VITALS: BP- 93/55 NECK: Thyroid  non-tender, no enlargement. CHEST: Lungs clear to auscultation bilaterally. CARDIOVASCULAR: Heart regular rate and rhythm.         Assessment & Plan:   Problem List Items Addressed This Visit       Cardiovascular and Mediastinum   Hypertension associated with diabetes (HCC)     Digestive   GERD  (gastroesophageal reflux disease)   Relevant Medications   esomeprazole  (NEXIUM ) 40 MG capsule     Endocrine   Type 2 diabetes mellitus with other specified complication (HCC) - Primary   Relevant Orders   Bayer DCA Hb A1c Waived   Hyperlipidemia associated with type 2 diabetes mellitus (HCC)     Other   Obesity (BMI 30.0-34.9)        Type 2 diabetes  mellitus with hypoglycemia Frequent hypoglycemic episodes likely due to tirzepatide dose and weight loss. - Reduced tirzepatide dose to 5 mg. - Instructed to monitor blood glucose levels and report via MyChart.  Obesity, class 1 Weight loss of 14 pounds over three months. - Continue current weight management plan.  Hypertension Home blood pressure readings in the 130s; today's reading 93/55 suggests possible overmedication. - Monitor blood pressure at home and report readings via MyChart. - Consider reducing antihypertensive medication if blood pressure remains low.  Gastroesophageal reflux disease (GERD) Improved symptoms with two Nexium  20 mg tablets; discussed cost differences between over-the-counter and prescription options. - Prescribed Nexium  40 mg for cost comparison with over-the-counter options. - Continue current regimen of two Nexium  20 mg if effective and cost-efficient.          Follow up plan: Return in about 6 months (around 05/28/2024), or if symptoms worsen or fail to improve, for Physical exam and diabetes recheck.  Counseling provided for all of the vaccine components Orders Placed This Encounter  Procedures   Bayer DCA Hb A1c Waived    Fonda Levins, MD [facility] 11/29/2023, 11:49 AM

## 2023-11-29 NOTE — Addendum Note (Signed)
 Addended by: JODENE CORNERS B on: 11/29/2023 12:17 PM   Modules accepted: Orders

## 2023-12-01 ENCOUNTER — Other Ambulatory Visit: Payer: Self-pay | Admitting: Family Medicine

## 2023-12-01 DIAGNOSIS — E7800 Pure hypercholesterolemia, unspecified: Secondary | ICD-10-CM

## 2023-12-04 ENCOUNTER — Inpatient Hospital Stay (HOSPITAL_COMMUNITY): Payer: Self-pay

## 2023-12-04 ENCOUNTER — Encounter (HOSPITAL_COMMUNITY)
Admission: EM | Disposition: A | Discharge status: Home / Self Care | Payer: Self-pay | Source: Home / Self Care | Attending: Cardiology

## 2023-12-04 ENCOUNTER — Other Ambulatory Visit: Payer: Self-pay

## 2023-12-04 ENCOUNTER — Inpatient Hospital Stay (HOSPITAL_COMMUNITY)
Admission: EM | Admit: 2023-12-04 | Discharge: 2023-12-06 | DRG: 280 | Disposition: A | Discharge status: Home / Self Care | Payer: Self-pay | Attending: Cardiology | Admitting: Cardiology

## 2023-12-04 DIAGNOSIS — I10 Essential (primary) hypertension: Secondary | ICD-10-CM

## 2023-12-04 DIAGNOSIS — F32A Depression, unspecified: Secondary | ICD-10-CM | POA: Diagnosis present

## 2023-12-04 DIAGNOSIS — E739 Lactose intolerance, unspecified: Secondary | ICD-10-CM | POA: Diagnosis present

## 2023-12-04 DIAGNOSIS — Z888 Allergy status to other drugs, medicaments and biological substances status: Secondary | ICD-10-CM

## 2023-12-04 DIAGNOSIS — I2111 ST elevation (STEMI) myocardial infarction involving right coronary artery: Principal | ICD-10-CM | POA: Diagnosis present

## 2023-12-04 DIAGNOSIS — K219 Gastro-esophageal reflux disease without esophagitis: Secondary | ICD-10-CM | POA: Diagnosis present

## 2023-12-04 DIAGNOSIS — Z8249 Family history of ischemic heart disease and other diseases of the circulatory system: Secondary | ICD-10-CM

## 2023-12-04 DIAGNOSIS — G8929 Other chronic pain: Secondary | ICD-10-CM | POA: Diagnosis present

## 2023-12-04 DIAGNOSIS — I5021 Acute systolic (congestive) heart failure: Secondary | ICD-10-CM | POA: Diagnosis present

## 2023-12-04 DIAGNOSIS — E11649 Type 2 diabetes mellitus with hypoglycemia without coma: Secondary | ICD-10-CM | POA: Diagnosis present

## 2023-12-04 DIAGNOSIS — M545 Low back pain, unspecified: Secondary | ICD-10-CM | POA: Diagnosis present

## 2023-12-04 DIAGNOSIS — I110 Hypertensive heart disease with heart failure: Secondary | ICD-10-CM | POA: Diagnosis present

## 2023-12-04 DIAGNOSIS — I2510 Atherosclerotic heart disease of native coronary artery without angina pectoris: Secondary | ICD-10-CM | POA: Diagnosis present

## 2023-12-04 DIAGNOSIS — F17210 Nicotine dependence, cigarettes, uncomplicated: Secondary | ICD-10-CM | POA: Diagnosis present

## 2023-12-04 DIAGNOSIS — I252 Old myocardial infarction: Secondary | ICD-10-CM

## 2023-12-04 DIAGNOSIS — I472 Ventricular tachycardia, unspecified: Secondary | ICD-10-CM | POA: Diagnosis not present

## 2023-12-04 DIAGNOSIS — Z91012 Allergy to eggs, unspecified: Secondary | ICD-10-CM

## 2023-12-04 DIAGNOSIS — I2119 ST elevation (STEMI) myocardial infarction involving other coronary artery of inferior wall: Principal | ICD-10-CM | POA: Diagnosis present

## 2023-12-04 DIAGNOSIS — Z7985 Long-term (current) use of injectable non-insulin antidiabetic drugs: Secondary | ICD-10-CM

## 2023-12-04 DIAGNOSIS — I493 Ventricular premature depolarization: Secondary | ICD-10-CM | POA: Diagnosis present

## 2023-12-04 DIAGNOSIS — Z79899 Other long term (current) drug therapy: Secondary | ICD-10-CM

## 2023-12-04 DIAGNOSIS — Z7902 Long term (current) use of antithrombotics/antiplatelets: Secondary | ICD-10-CM

## 2023-12-04 DIAGNOSIS — R570 Cardiogenic shock: Secondary | ICD-10-CM | POA: Diagnosis present

## 2023-12-04 DIAGNOSIS — E785 Hyperlipidemia, unspecified: Secondary | ICD-10-CM | POA: Diagnosis present

## 2023-12-04 DIAGNOSIS — E7800 Pure hypercholesterolemia, unspecified: Secondary | ICD-10-CM

## 2023-12-04 DIAGNOSIS — I2584 Coronary atherosclerosis due to calcified coronary lesion: Secondary | ICD-10-CM | POA: Diagnosis present

## 2023-12-04 HISTORY — PX: LEFT HEART CATH AND CORONARY ANGIOGRAPHY: CATH118249

## 2023-12-04 LAB — CBC WITH DIFFERENTIAL/PLATELET
Abs Immature Granulocytes: 0.05 K/uL (ref 0.00–0.07)
Basophils Absolute: 0 K/uL (ref 0.0–0.1)
Basophils Relative: 0 %
Eosinophils Absolute: 0 K/uL (ref 0.0–0.5)
Eosinophils Relative: 0 %
HCT: 39.5 % (ref 39.0–52.0)
Hemoglobin: 13.4 g/dL (ref 13.0–17.0)
Immature Granulocytes: 1 %
Lymphocytes Relative: 16 %
Lymphs Abs: 1.6 K/uL (ref 0.7–4.0)
MCH: 31 pg (ref 26.0–34.0)
MCHC: 33.9 g/dL (ref 30.0–36.0)
MCV: 91.4 fL (ref 80.0–100.0)
Monocytes Absolute: 0.3 K/uL (ref 0.1–1.0)
Monocytes Relative: 4 %
Neutro Abs: 7.7 K/uL (ref 1.7–7.7)
Neutrophils Relative %: 79 %
Platelets: 154 K/uL (ref 150–400)
RBC: 4.32 MIL/uL (ref 4.22–5.81)
RDW: 12.2 % (ref 11.5–15.5)
WBC: 9.7 K/uL (ref 4.0–10.5)
nRBC: 0 % (ref 0.0–0.2)

## 2023-12-04 LAB — CBC
HCT: 39.6 % (ref 39.0–52.0)
Hemoglobin: 13.5 g/dL (ref 13.0–17.0)
MCH: 30.8 pg (ref 26.0–34.0)
MCHC: 34.1 g/dL (ref 30.0–36.0)
MCV: 90.2 fL (ref 80.0–100.0)
Platelets: 191 K/uL (ref 150–400)
RBC: 4.39 MIL/uL (ref 4.22–5.81)
RDW: 12.1 % (ref 11.5–15.5)
WBC: 13.1 K/uL — ABNORMAL HIGH (ref 4.0–10.5)
nRBC: 0 % (ref 0.0–0.2)

## 2023-12-04 LAB — POCT I-STAT, CHEM 8
BUN: 11 mg/dL (ref 6–20)
Calcium, Ion: 1.18 mmol/L (ref 1.15–1.40)
Chloride: 101 mmol/L (ref 98–111)
Creatinine, Ser: 0.8 mg/dL (ref 0.61–1.24)
Glucose, Bld: 238 mg/dL — ABNORMAL HIGH (ref 70–99)
HCT: 39 % (ref 39.0–52.0)
Hemoglobin: 13.3 g/dL (ref 13.0–17.0)
Potassium: 4.2 mmol/L (ref 3.5–5.1)
Sodium: 133 mmol/L — ABNORMAL LOW (ref 135–145)
TCO2: 20 mmol/L — ABNORMAL LOW (ref 22–32)

## 2023-12-04 LAB — COMPREHENSIVE METABOLIC PANEL WITH GFR
ALT: 33 U/L (ref 0–44)
AST: 40 U/L (ref 15–41)
Albumin: 3.4 g/dL — ABNORMAL LOW (ref 3.5–5.0)
Alkaline Phosphatase: 36 U/L — ABNORMAL LOW (ref 38–126)
Anion gap: 10 (ref 5–15)
BUN: 9 mg/dL (ref 6–20)
CO2: 18 mmol/L — ABNORMAL LOW (ref 22–32)
Calcium: 8.4 mg/dL — ABNORMAL LOW (ref 8.9–10.3)
Chloride: 102 mmol/L (ref 98–111)
Creatinine, Ser: 0.89 mg/dL (ref 0.61–1.24)
GFR, Estimated: >60 mL/min (ref >60)
Glucose, Bld: 244 mg/dL — ABNORMAL HIGH (ref 70–99)
Potassium: 4 mmol/L (ref 3.5–5.1)
Sodium: 130 mmol/L — ABNORMAL LOW (ref 135–145)
Total Bilirubin: 0.8 mg/dL (ref 0.0–1.2)
Total Protein: 5.6 g/dL — ABNORMAL LOW (ref 6.5–8.1)

## 2023-12-04 LAB — COOXEMETRY PANEL
Carboxyhemoglobin: 5 % (ref 0.5–1.5)
Methemoglobin: <0.7 % (ref 0.0–1.5)
O2 Saturation: 63 %
Total hemoglobin: 14 g/dL (ref 12.0–16.0)

## 2023-12-04 LAB — LACTIC ACID, PLASMA: Lactic Acid, Venous: 1 mmol/L (ref 0.5–1.9)

## 2023-12-04 LAB — PROTIME-INR
INR: 1.3 — ABNORMAL HIGH (ref 0.8–1.2)
Prothrombin Time: 17 s — ABNORMAL HIGH (ref 11.4–15.2)

## 2023-12-04 LAB — CREATININE, SERUM
Creatinine, Ser: 0.92 mg/dL (ref 0.61–1.24)
GFR, Estimated: >60 mL/min (ref >60)

## 2023-12-04 LAB — LIPID PANEL
Cholesterol: 104 mg/dL (ref 0–200)
HDL: 34 mg/dL — ABNORMAL LOW (ref >40)
LDL Cholesterol: 53 mg/dL (ref 0–99)
Total CHOL/HDL Ratio: 3.1 ratio
Triglycerides: 86 mg/dL (ref <150)
VLDL: 17 mg/dL (ref 0–40)

## 2023-12-04 LAB — ECHOCARDIOGRAM COMPLETE
AR max vel: 4.06 cm2
AV Area VTI: 3.95 cm2
AV Area mean vel: 4.02 cm2
AV Mean grad: 2 mmHg
AV Peak grad: 4.7 mmHg
Ao pk vel: 1.08 m/s
Area-P 1/2: 3.77 cm2
S' Lateral: 3.6 cm
Single Plane A4C EF: 48.2 %

## 2023-12-04 LAB — GLUCOSE, CAPILLARY
Glucose-Capillary: 111 mg/dL — ABNORMAL HIGH (ref 70–99)
Glucose-Capillary: 134 mg/dL — ABNORMAL HIGH (ref 70–99)
Glucose-Capillary: 146 mg/dL — ABNORMAL HIGH (ref 70–99)

## 2023-12-04 LAB — TROPONIN I (HIGH SENSITIVITY)
Troponin I (High Sensitivity): 1264 ng/L (ref <18)
Troponin I (High Sensitivity): 2855 ng/L (ref <18)

## 2023-12-04 LAB — HIV ANTIBODY (ROUTINE TESTING W REFLEX): HIV Screen 4th Generation wRfx: NONREACTIVE

## 2023-12-04 LAB — CG4 I-STAT (LACTIC ACID): Lactic Acid, Venous: 0.8 mmol/L (ref 0.5–1.9)

## 2023-12-04 LAB — APTT: aPTT: >200 s (ref 24–36)

## 2023-12-04 SURGERY — LEFT HEART CATH AND CORONARY ANGIOGRAPHY
Anesthesia: LOCAL

## 2023-12-04 MED ORDER — MAGNESIUM SULFATE 2 GM/50ML IV SOLN
2.0000 g | Freq: Once | INTRAVENOUS | Status: AC
  Administered 2023-12-04: 2 g via INTRAVENOUS
  Filled 2023-12-04: qty 50

## 2023-12-04 MED ORDER — HEPARIN SODIUM (PORCINE) 1000 UNIT/ML IJ SOLN
INTRAMUSCULAR | Status: DC | PRN
  Administered 2023-12-04: 5000 [IU] via INTRAVENOUS

## 2023-12-04 MED ORDER — AMIODARONE HCL IN DEXTROSE 360-4.14 MG/200ML-% IV SOLN
60.0000 mg/h | INTRAVENOUS | Status: DC
  Administered 2023-12-04 (×2): 60 mg/h via INTRAVENOUS
  Filled 2023-12-04: qty 200
  Filled 2023-12-04: qty 400

## 2023-12-04 MED ORDER — LIDOCAINE HCL (PF) 1 % IJ SOLN
INTRAMUSCULAR | Status: DC | PRN
  Administered 2023-12-04: 2 mL via INTRADERMAL

## 2023-12-04 MED ORDER — VERAPAMIL HCL 2.5 MG/ML IV SOLN
INTRAVENOUS | Status: AC
Start: 2023-12-04 — End: 2023-12-04
  Filled 2023-12-04: qty 2

## 2023-12-04 MED ORDER — AMIODARONE HCL IN DEXTROSE 360-4.14 MG/200ML-% IV SOLN
30.0000 mg/h | INTRAVENOUS | Status: DC
  Administered 2023-12-05 – 2023-12-06 (×4): 30 mg/h via INTRAVENOUS
  Filled 2023-12-04 (×2): qty 200

## 2023-12-04 MED ORDER — FENTANYL CITRATE (PF) 50 MCG/ML IJ SOSY
12.5000 ug | PREFILLED_SYRINGE | INTRAMUSCULAR | Status: DC | PRN
  Administered 2023-12-04 – 2023-12-05 (×7): 12.5 ug via INTRAVENOUS
  Filled 2023-12-04 (×7): qty 1

## 2023-12-04 MED ORDER — SODIUM CHLORIDE 0.9 % IV SOLN: 250.0000 mL | INTRAVENOUS | Status: AC

## 2023-12-04 MED ORDER — SODIUM CHLORIDE 0.9% FLUSH: 3.0000 mL | INTRAVENOUS | Status: DC | PRN

## 2023-12-04 MED ORDER — PAROXETINE HCL 20 MG PO TABS
40.0000 mg | ORAL_TABLET | Freq: Every day | ORAL | Status: DC
  Administered 2023-12-04 – 2023-12-05 (×2): 40 mg via ORAL
  Filled 2023-12-04 (×3): qty 2

## 2023-12-04 MED ORDER — CLOPIDOGREL BISULFATE 75 MG PO TABS
75.0000 mg | ORAL_TABLET | Freq: Every day | ORAL | Status: DC
  Administered 2023-12-05 – 2023-12-06 (×2): 75 mg via ORAL
  Filled 2023-12-04 (×2): qty 1

## 2023-12-04 MED ORDER — ROSUVASTATIN CALCIUM 20 MG PO TABS
40.0000 mg | ORAL_TABLET | Freq: Every day | ORAL | Status: DC
  Administered 2023-12-04 – 2023-12-05 (×2): 40 mg via ORAL
  Filled 2023-12-04 (×2): qty 2

## 2023-12-04 MED ORDER — ONE-DAILY MULTI VITAMINS PO TABS: 1.0000 | ORAL_TABLET | Freq: Every day | ORAL | Status: DC

## 2023-12-04 MED ORDER — LIDOCAINE HCL (PF) 1 % IJ SOLN
INTRAMUSCULAR | Status: AC
Start: 2023-12-04 — End: 2023-12-04
  Filled 2023-12-04: qty 30

## 2023-12-04 MED ORDER — PAROXETINE HCL 20 MG PO TABS: 40.0000 mg | ORAL_TABLET | Freq: Every day | ORAL | Status: DC

## 2023-12-04 MED ORDER — ONDANSETRON HCL 4 MG/2ML IJ SOLN: 4.0000 mg | Freq: Four times a day (QID) | INTRAMUSCULAR | Status: DC | PRN

## 2023-12-04 MED ORDER — HEPARIN (PORCINE) IN NACL 1000-0.9 UT/500ML-% IV SOLN
INTRAVENOUS | Status: DC | PRN
  Administered 2023-12-04: 1000 mL

## 2023-12-04 MED ORDER — SODIUM CHLORIDE 0.9% FLUSH
3.0000 mL | Freq: Two times a day (BID) | INTRAVENOUS | Status: DC
  Administered 2023-12-04 – 2023-12-06 (×4): 3 mL via INTRAVENOUS

## 2023-12-04 MED ORDER — ENOXAPARIN SODIUM 40 MG/0.4ML IJ SOSY
40.0000 mg | PREFILLED_SYRINGE | INTRAMUSCULAR | Status: DC
  Administered 2023-12-05 – 2023-12-06 (×2): 40 mg via SUBCUTANEOUS
  Filled 2023-12-04 (×2): qty 0.4

## 2023-12-04 MED ORDER — ACETAMINOPHEN 325 MG PO TABS
650.0000 mg | ORAL_TABLET | ORAL | Status: DC | PRN
  Administered 2023-12-06: 650 mg via ORAL
  Filled 2023-12-04: qty 2

## 2023-12-04 MED ORDER — NICOTINE 7 MG/24HR TD PT24
7.0000 mg | MEDICATED_PATCH | Freq: Every day | TRANSDERMAL | Status: DC
  Administered 2023-12-04 – 2023-12-05 (×2): 7 mg via TRANSDERMAL
  Filled 2023-12-04 (×3): qty 1

## 2023-12-04 MED ORDER — ALUM & MAG HYDROXIDE-SIMETH 200-200-20 MG/5ML PO SUSP
30.0000 mL | Freq: Once | ORAL | Status: AC
  Administered 2023-12-04: 30 mL via ORAL
  Filled 2023-12-04: qty 30

## 2023-12-04 MED ORDER — PANTOPRAZOLE SODIUM 40 MG IV SOLR
40.0000 mg | Freq: Two times a day (BID) | INTRAVENOUS | Status: DC
Start: 2023-12-04 — End: 2023-12-06
  Administered 2023-12-04 – 2023-12-06 (×5): 40 mg via INTRAVENOUS
  Filled 2023-12-04 (×5): qty 10

## 2023-12-04 MED ORDER — IOHEXOL 350 MG/ML SOLN
INTRAVENOUS | Status: DC | PRN
  Administered 2023-12-04: 50 mL

## 2023-12-04 MED ORDER — INSULIN ASPART 100 UNIT/ML IJ SOLN
0.0000 [IU] | INTRAMUSCULAR | Status: DC
  Administered 2023-12-04 – 2023-12-06 (×5): 2 [IU] via SUBCUTANEOUS
  Filled 2023-12-04 (×5): qty 2

## 2023-12-04 MED ORDER — SODIUM CHLORIDE 0.9 % IV SOLN: 250.0000 mL | INTRAVENOUS | Status: AC | PRN

## 2023-12-04 MED ORDER — NITROGLYCERIN 0.4 MG SL SUBL
0.4000 mg | SUBLINGUAL_TABLET | SUBLINGUAL | Status: DC | PRN
  Administered 2023-12-04: 0.4 mg via SUBLINGUAL
  Filled 2023-12-04: qty 1

## 2023-12-04 MED ORDER — ASPIRIN 81 MG PO TBEC
81.0000 mg | DELAYED_RELEASE_TABLET | Freq: Every day | ORAL | Status: DC
  Administered 2023-12-05 – 2023-12-06 (×2): 81 mg via ORAL
  Filled 2023-12-04 (×2): qty 1

## 2023-12-04 MED ORDER — HEPARIN SODIUM (PORCINE) 1000 UNIT/ML IJ SOLN
INTRAMUSCULAR | Status: AC
  Filled 2023-12-04: qty 10

## 2023-12-04 MED ORDER — MIDAZOLAM HCL (PF) 2 MG/2ML IJ SOLN
2.0000 mg | Freq: Once | INTRAMUSCULAR | Status: AC
  Administered 2023-12-04: 2 mg via INTRAVENOUS
  Filled 2023-12-04: qty 2

## 2023-12-04 MED ORDER — NOREPINEPHRINE 4 MG/250ML-% IV SOLN
0.0000 ug/min | INTRAVENOUS | Status: DC
  Administered 2023-12-04: 2 ug/min via INTRAVENOUS
  Filled 2023-12-04: qty 250

## 2023-12-04 MED ORDER — VERAPAMIL HCL 2.5 MG/ML IV SOLN
INTRAVENOUS | Status: DC | PRN
  Administered 2023-12-04: 10 mL via INTRA_ARTERIAL

## 2023-12-04 MED ORDER — SODIUM CHLORIDE 0.9 % WEIGHT BASED INFUSION: 1.0000 mL/kg/h | INTRAVENOUS | Status: AC

## 2023-12-04 MED ORDER — ROSUVASTATIN CALCIUM 40 MG PO TABS: 40.0000 mg | ORAL_TABLET | Freq: Every day | ORAL | Status: DC

## 2023-12-04 MED ORDER — EPINEPHRINE HCL 5 MG/250ML IV SOLN IN NS
INTRAVENOUS | Status: AC
  Filled 2023-12-04: qty 250

## 2023-12-04 MED ORDER — AMIODARONE LOAD VIA INFUSION
150.0000 mg | Freq: Once | INTRAVENOUS | Status: AC
  Administered 2023-12-04: 150 mg via INTRAVENOUS
  Filled 2023-12-04: qty 83.34

## 2023-12-04 MED ORDER — CLOPIDOGREL BISULFATE 75 MG PO TABS: 75.0000 mg | ORAL_TABLET | Freq: Every day | ORAL | Status: DC

## 2023-12-04 MED ORDER — ENOXAPARIN SODIUM 40 MG/0.4ML IJ SOSY: 40.0000 mg | PREFILLED_SYRINGE | INTRAMUSCULAR | Status: DC

## 2023-12-04 MED ORDER — ADULT MULTIVITAMIN W/MINERALS CH
1.0000 | ORAL_TABLET | Freq: Every day | ORAL | Status: DC
  Administered 2023-12-04 – 2023-12-06 (×3): 1 via ORAL
  Filled 2023-12-04 (×3): qty 1

## 2023-12-04 SURGICAL SUPPLY — 7 items
CATH 5FR JL3.5 JR4 ANG PIG MP (CATHETERS) IMPLANT
DEVICE RAD COMP TR BAND LRG (VASCULAR PRODUCTS) IMPLANT
GLIDESHEATH SLEND SS 6F .021 (SHEATH) IMPLANT
GUIDEWIRE INQWIRE 1.5J.035X260 (WIRE) IMPLANT
PACK CARDIAC CATHETERIZATION (CUSTOM PROCEDURE TRAY) ×1 IMPLANT
SET ATX-X65L (MISCELLANEOUS) IMPLANT
SHEATH PROBE COVER 6X72 (BAG) IMPLANT

## 2023-12-04 NOTE — Progress Notes (Signed)
 Echocardiogram 2D Echocardiogram has been performed.  Jason Morris 12/04/2023, 4:37 PM

## 2023-12-04 NOTE — Consult Note (Addendum)
 Advanced Heart Failure Team Consult Note   Primary Physician: Dettinger, Fonda LABOR, MD Cardiologist:  Dr. Pietro   Reason for Consultation: Inferior STEMI/RV Infarct   HPI:    Jason Morris is seen today for evaluation of acute RV infarct/systolic heart failure at the request of Dr. Jordan, Cardiology.   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 Previous LHC in 2016 showed severe aneurysmal dilatation of the RCA, LAD and circumflex but no obstructive lesions. He was treated with aspirin  and Plavix . Also w/ known b/l RAS by doppler study in 2015 (1/59% b/l stenosis).  Now admitted as late presentation MI. He presented today after 9 hrs of chest pain. EKG showed inferior STE. Emergent cath showed occlusion of the proximal RCA. The vessel is severely aneurysmal, calcified and extremely tortuous. Anatomy not felt amendable to PCI. He has collaterals to the PDA. LAD also aneurysmal and 1st diag w/ 85% stenosis, treated medically. LVG demonstrated moderately reduced LV Fx, EF 40-45% w/ basal to mid inferior AK, LVEDP mildly elevated at 18 mmHg. Initial POC LA normal at 0.8.   Upon arrival to [name] pt continued w/ ongoing CP. RN gave SL NTG and SBP dropped to 70s. He is now on NE and c/w 9/10 CP. Denies dyspnea. CCM has also been consulted to assist w/ placement of central line.   Admit Labs: WBC 9.7, Hgb 13.4, Na 130, K 4.0, CO2 18, BUN 9, SCr 0.89, Hs trop 1264, Gluc 244, POC LA 0.8    Home Medications Prior to Admission medications   Medication Sig Start Date End Date Taking? Authorizing Provider  clopidogrel  (PLAVIX ) 75 MG tablet Take 1 tablet (75 mg total) by mouth daily. 05/23/23   Dettinger, Fonda LABOR, MD  esomeprazole  (NEXIUM ) 40 MG capsule Take 1 capsule (40 mg total) by mouth daily. 11/29/23   Dettinger, Fonda LABOR, MD  lisinopril  (ZESTRIL ) 40 MG tablet Take 1 tablet (40 mg total) by mouth daily. 05/23/23   Dettinger, Fonda LABOR, MD   Multiple Vitamin (MULTIVITAMIN) tablet Take 1 tablet by mouth daily.    [provider]  nabumetone  (RELAFEN ) 500 MG tablet Take 2 tablets (1,000 mg total) by mouth 2 (two) times daily. For muscle and joint pain 11/23/22   Dettinger, Fonda LABOR, MD  PARoxetine  (PAXIL ) 40 MG tablet Take 1 tablet (40 mg total) by mouth daily. 08/28/23   Dettinger, Fonda LABOR, MD  rosuvastatin  (CRESTOR ) 20 MG tablet TAKE ONE TABLET DAILY 12/02/23   Dettinger, Fonda LABOR, MD  sildenafil  (REVATIO ) 20 MG tablet Take 1-5 tablets (20-100 mg total) by mouth as needed. 05/23/23   Dettinger, Fonda LABOR, MD  tirzepatide [facility]) 7.5 MG/0.5ML Pen Inject 7.5 mg into the skin once a week.    [provider]  traZODone  (DESYREL ) 100 MG tablet Take 0.5-1 tablets (50-100 mg total) by mouth at bedtime as needed for sleep. 05/23/23   Dettinger, Fonda LABOR, MD  varenicline  (CHANTIX ) 1 MG tablet Take 1 tablet (1 mg total) by mouth 2 (two) times daily. 11/29/23   Dettinger, Fonda LABOR, MD  atorvastatin  (LIPITOR) 80 MG tablet Take 1 tablet (80 mg total) by mouth daily at 6 PM. 08/09/12 08/17/12  Lelon Glendia ONEIDA DEVONNA    Past Medical History: Past Medical History:  Diagnosis Date   Anxiety    CAD (coronary artery disease)    a. Chest CTA 8/14 with coronary Ca and RCA aneurysmal dilatation    b.  NSTEMI s/p LHC severe aneurysmal dilatation of the RCA, LAD and LCx without flow limiting lesions.   Chronic lower back pain    Depression    GERD (gastroesophageal reflux disease)    HLD (hyperlipidemia)    Hypertension    Migraines    Tobacco abuse     Past Surgical History: Past Surgical History:  Procedure Laterality Date   LEFT HEART CATH N/A 02/21/2014   Procedure: LEFT HEART CATH;  Surgeon: Lonni JONETTA Cash, MD;  Location: [facility] CATH LAB;  Service: Cardiovascular;  Laterality: N/A;   MYRINGOTOMY WITH TUBE PLACEMENT Bilateral    4-5 times as a child (08/09/2012)    Family History: Family History  Problem Relation Age  of Onset   Heart attack Father 17   Heart attack Paternal Grandfather 43   Heart attack Paternal Uncle 82    Social History: Social History   Socioeconomic History   Marital status: Divorced    Spouse name: Not on file   Number of children: Not on file   Years of education: Not on file   Highest education level: Not on file  Occupational History   Not on file  Tobacco Use   Smoking status: Heavy Smoker    Current packs/day: 1.00    Average packs/day: 1 pack/day for 17.0 years (17.0 ttl pk-yrs)    Types: Cigarettes   Smokeless tobacco: Never   Tobacco comments:    08/09/2012 smoked 3 ppd til 1 month ago   Substance and Sexual Activity   Alcohol use: No    Alcohol/week: 0.0 standard drinks of alcohol    Comment: 08/09/2012 had a mixed drink once in the past year   Drug use: No   Sexual activity: Yes  Other Topics Concern   Not on file  Social History Narrative   Not on file   Social Drivers of Health   Financial Resource Strain: Not on file  Food Insecurity: No Food Insecurity (12/04/2023)   Hunger Vital Sign    Worried About Running Out of Food in the Last Year: Never true    Ran Out of Food in the Last Year: Never true  Transportation Needs: No Transportation Needs (12/04/2023)   PRAPARE - Administrator, Civil Service (Medical): No    Lack of Transportation (Non-Medical): No  Physical Activity: Not on file  Stress: Not on file  Social Connections: Not on file    Allergies:  Allergies  Allergen Reactions   Lipitor [Atorvastatin ] Hives   Lopressor  [Metoprolol  Tartrate] Shortness Of Breath    Felt like he was going to die , could not breath & syncope   Egg Protein-Containing Drug Products Other (See Comments)    Really bad stomach pain   Lactose Intolerance (Gi) Diarrhea    Objective:    Vital Signs:   Pulse Rate:  [68-73] 68 (11/26 1224) Resp:  [18-30] 21 (11/26 1224) BP: (111-119)/(69-76) 119/72 (11/26 1224) SpO2:  [94 %-100 %] 100 %  (11/26 1224)    Weight change: There were no vitals filed for this visit.  Intake/Output:  No intake or output data in the 24 hours ending 12/04/23 1333    Physical Exam    General:  fatigued appearing. No resp difficulty HEENT: normal Neck: supple. JVP not elevated.  Cor: PMI nondisplaced. Regular rate & rhythm. No rubs, gallops or murmurs. Lungs: clear Abdomen: soft, nontender, nondistended. No hepatosplenomegaly. No bruits or masses. Good bowel sounds. Extremities: no cyanosis, clubbing, rash, edema Neuro: alert &  orientedx3, cranial nerves grossly intact. moves all 4 extremities w/o difficulty. Affect pleasant   Telemetry   NSR 70s, 1 8 beat run of SVT, personally reviewed   EKG    Admit EKG not available for review   Labs   Basic Metabolic Panel: Recent Labs  Lab 12/04/23 1215 12/04/23 1216  NA 130* 133*  K 4.0 4.2  CL 102 101  CO2 18*  --   GLUCOSE 244* 238*  BUN 9 11  CREATININE 0.89 0.80  CALCIUM  8.4*  --     Liver Function Tests: Recent Labs  Lab 12/04/23 1215  AST 40  ALT 33  ALKPHOS 36*  BILITOT 0.8  PROT 5.6*  ALBUMIN 3.4*   No results for input(s): LIPASE, AMYLASE in the last 168 hours. No results for input(s): AMMONIA in the last 168 hours.  CBC: Recent Labs  Lab 12/04/23 1215 12/04/23 1216  WBC 9.7  --   NEUTROABS 7.7  --   HGB 13.4 13.3  HCT 39.5 39.0  MCV 91.4  --   PLT 154  --     Cardiac Enzymes: No results for input(s): CKTOTAL, CKMB, CKMBINDEX, TROPONINI in the last 168 hours.  BNP: BNP (last 3 results) No results for input(s): BNP in the last 8760 hours.  ProBNP (last 3 results) No results for input(s): PROBNP in the last 8760 hours.   CBG: No results for input(s): GLUCAP in the last 168 hours.  Coagulation Studies: Recent Labs    12/04/23 1215  LABPROT 17.0*  INR 1.3*     Imaging   CARDIAC CATHETERIZATION Result Date: 12/04/2023   1st Diag lesion is 85% stenosed.   Prox RCA  to Dist RCA lesion is 100% stenosed.   1st Mrg lesion is 50% stenosed.   The left ventricular ejection fraction is 35-45% by visual estimate. Occlusion of the proximal RCA. 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 85% stenosis in first diagonal Aneurysmal LAD Moderate LV dysfunction. EF 40-45% with basal to mid inferior akinesis Mildly elevated LVEDP 18 mm Hg Plan: recommend medical management. Reviewed prior cath study from 2016. Given marked aneurysmal dilation of the entire RCA, marked tortuosity and calcification I don't think there is a PCI solution. 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 There are collaterals to the PDA. He is also presenting late in the course of his infarct.     Medications:     Current Medications:  [START ON 12/05/2023] aspirin  EC  81 mg Oral Daily   clopidogrel   75 mg Oral Daily   clopidogrel   75 mg Oral Daily   [START ON 12/05/2023] enoxaparin  (LOVENOX ) injection  40 mg Subcutaneous Q24H   [START ON 12/05/2023] enoxaparin  (LOVENOX ) injection  40 mg Subcutaneous Q24H   multivitamin  1 tablet Oral Daily   PARoxetine   40 mg Oral Daily   rosuvastatin   40 mg Oral Daily   sodium chloride  flush  3 mL Intravenous Q12H    Infusions:  sodium chloride      sodium chloride         Patient Profile   48 y/o male w/ HTN, HLD, T2DM, tobacco use and known aneurysmal coronary disease, admitted w/ late presentation inferior STEMI d/t RV infarct, acute systolic heart failure.   Assessment/Plan   1. CAD/ Late Presentation Inferior MI  - 9 hr of CP prior to ED presentation  - culprit pRCA occlusion w/ collaterals to PDA, no intervention. Medical management  - Aneurysmal disease of LAD,  RCA severely aneurysmal, calcified and extremely tortuous. Also w/ 85% mid 1st diag, also treated medically  - C/w CP. No NTG w/ RV infarct. Order has been discontinued - Manage pain for now w/ morphine   - No ? 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 PCI of diag lesion   2. Acute Systolic Heart Failure - cath LV gram EF 40-45%, basal to mid inferior AK in setting or RV infarct. LVEDP 18  - obtain formal echo  - support BP w/ NE - trend serial LA for perfusion monitoring  - CCM to place central line - will check and follow co-ox  - pre-load dependent w/ RV dysfunction. Set up CVP monitoring after CVL placement  - no GDMT yet w/ soft BP requiring NE - start digoxin 0.125 mg   3. HLD - LDL 53 - rosuvastatin  40 mg   4. Type 2 DM - Hgb A1c pending   5. Tobacco Use - smokes 1ppd  - cessation advised   6. RAS - dopplers in 2015 w/ 1-59% b/l stenosis  - needs re-assessment but can do as outpatient    CRITICAL CARE Performed by: Caffie Shed   Total critical care time: 25 minutes  Critical care time was exclusive of separately billable procedures and treating other patients.  Critical care was necessary to treat or prevent imminent or life-threatening deterioration.  Critical care was time spent personally by me on the following activities: development of treatment plan with patient and/or surrogate as well as nursing, discussions with consultants, evaluation of patient's response to treatment, examination of patient, obtaining history from patient or surrogate, ordering and performing treatments and interventions, ordering and review of laboratory studies, ordering and review of radiographic studies, pulse oximetry and re-evaluation of patient's condition.   Length of Stay: 0  Caffie Shed, PA-C  12/04/2023, 1:33 PM    Advanced Heart Failure Team Pager [phone] (M-F; 7a - 5p)  Please contact [facility] for night-coverage after hours (4p -7a ) and weekends on [email or web address]  Agree with above.   48 y/o male with tobacco use, DM2 and CAD admitted with late presenting  inferior MI with RV involvement,   Cath films reviewed. RCA huge ectactic and aneurysmal vessels with severe calcification occluded in proximal to midsection. No PCI options. LAD also anuerysmal.   Continues to have CP now on NE for BP support.   ECHO EF 40-45 with inferior AK and mild to moderate RV dysfunction. Triv MR  General:  Sitting up in bed. Uncomfortable HEENT: normal Neck: supple. no JVD.  Cor: Regular rate & rhythm. No rubs, gallops or murmurs. Lungs: clear Abdomen: soft, nontender, nondistended.Good bowel sounds. Extremities: no cyanosis, clubbing, rash, edema Neuro: alert & orientedx3, cranial nerves grossly intact. moves all 4 extremities w/o difficulty. Affect pleasant  He is in the process of completing his infarct. Will continue hemodynamic support with NE. Pain control with fentanyl  and PRN versed .   Co-ox 63%. Will watch rhythms closely. No need currently for mechanical support. Can give IVF as needed to support RV infarct.  CRITICAL CARE Performed by: Cherrie Sieving  Total critical care time: 44 minutes  Critical care time was exclusive of separately billable procedures and treating other patients.  Critical care was necessary to treat or prevent imminent or life-threatening deterioration.  Critical care was time spent  personally by me (independent of midlevel providers or residents) on the following activities: development of treatment plan with patient and/or surrogate as well as nursing, discussions with consultants, evaluation of patient's response to treatment, examination of patient, obtaining history from patient or surrogate, ordering and performing treatments and interventions, ordering and review of laboratory studies, ordering and review of radiographic studies, pulse oximetry and re-evaluation of patient's condition.  Toribio Fuel, MD  5:48 PM  Toribio Fuel, MD  5:48 PM

## 2023-12-04 NOTE — H&P (Signed)
 Cardiology Admission History and Physical   Patient ID: Jason Morris MRN: [id number]; DOB: [birth year]   Admission date: 12/04/2023  PCP:  Dettinger, Fonda LABOR, MD   [facility] Providers Cardiologist: Previously Dr. Pietro   Chief Complaint: STEMI   Patient Profile: Jason Morris is a 48 y.o. male with PMH of CAC, aneurysmal dilatation of RCA, hypertension, hyperlipidemia, diabetes, tobacco use who is being seen 12/04/2023 for the evaluation of STEMI.  History of Present Illness: Jason Morris is a 48 year old male with past medical history noted above.  Underwent CT scan in 2014 which showed no PE but noted coronary artery calcification and aneurysmal dilatation of RCA questionable Kawasaki's?  CT also notable for 2 serial 70% lesions in proximal RCA and 50% and PDA with a calcium  score of 3557.  Echo showed normal LV function with mild atrial enlargement.  Underwent renal Dopplers November 2015 which showed 1 to 59% bilateral disease.  Stress test in 2015 with normal perfusion.  Admitted 02/2014 with chest pain and mildly elevated troponin and underwent cardiac catheterization with severe aneurysmal dilatation of the RCA, LAD and circumflex.  He was treated with aspirin  and Plavix .  Previous workup has shown normal rheumatoid factor as well as antinuclear antibody.  He was last seen by Dr. Pietro in 2018 for routine follow-up.   Since that time has been following with PCP on a regular basis.  Seen in the ED 11/25/2023 for evaluation of chest pain.  Stated been having episodes of epigastric and chest pain that radiated down his left arm for 4 to 5 days prior to presentation.  High-sensitivity troponin was 15.  EKG without ischemic changes and he was discharged home diagnosis of GERD.  He was seen by his PCP on 11/21, reported to having hypoglycemic episodes in his chair his appetite was reduced to 5 mg daily.  Patient reports developing severe chest pain with upper back pain and  left arm pain around 3 AM this morning.  Symptoms did not subside and EMS was ultimately called.  Code STEMI activated in the field secondary to ST elevation in inferior leads.  He was brought directly to the Cath Lab.   Past Medical History:  Diagnosis Date   Anxiety    CAD (coronary artery disease)    a. Chest CTA 8/14 with coronary Ca and RCA aneurysmal dilatation    b. NSTEMI s/p LHC severe aneurysmal dilatation of the RCA, LAD and LCx without flow limiting lesions.   Chronic lower back pain    Depression    GERD (gastroesophageal reflux disease)    HLD (hyperlipidemia)    Hypertension    Migraines    Tobacco abuse    Past Surgical History:  Procedure Laterality Date   LEFT HEART CATH N/A 02/21/2014   Procedure: LEFT HEART CATH;  Surgeon: Lonni JONETTA Cash, MD;  Location: [facility] CATH LAB;  Service: Cardiovascular;  Laterality: N/A;   MYRINGOTOMY WITH TUBE PLACEMENT Bilateral    4-5 times as a child (08/09/2012)     Medications Prior to Admission: Prior to Admission medications   Medication Sig Start Date End Date Taking? Authorizing Provider  clopidogrel  (PLAVIX ) 75 MG tablet Take 1 tablet (75 mg total) by mouth daily. 05/23/23   Dettinger, Fonda LABOR, MD  esomeprazole  (NEXIUM ) 40 MG capsule Take 1 capsule (40 mg total) by mouth daily. 11/29/23   Dettinger, Fonda LABOR, MD  lisinopril  (ZESTRIL ) 40 MG tablet Take 1 tablet (40 mg total) by mouth daily. 05/23/23  Dettinger, Fonda LABOR, MD  Multiple Vitamin (MULTIVITAMIN) tablet Take 1 tablet by mouth daily.    [provider]  nabumetone  (RELAFEN ) 500 MG tablet Take 2 tablets (1,000 mg total) by mouth 2 (two) times daily. For muscle and joint pain 11/23/22   Dettinger, Fonda LABOR, MD  PARoxetine  (PAXIL ) 40 MG tablet Take 1 tablet (40 mg total) by mouth daily. 08/28/23   Dettinger, Fonda LABOR, MD  rosuvastatin  (CRESTOR ) 20 MG tablet TAKE ONE TABLET DAILY 12/02/23   Dettinger, Fonda LABOR, MD  sildenafil  (REVATIO ) 20 MG tablet Take 1-5  tablets (20-100 mg total) by mouth as needed. 05/23/23   Dettinger, Fonda LABOR, MD  tirzepatide [facility]) 7.5 MG/0.5ML Pen Inject 7.5 mg into the skin once a week.    [provider]  traZODone  (DESYREL ) 100 MG tablet Take 0.5-1 tablets (50-100 mg total) by mouth at bedtime as needed for sleep. 05/23/23   Dettinger, Fonda LABOR, MD  varenicline  (CHANTIX ) 1 MG tablet Take 1 tablet (1 mg total) by mouth 2 (two) times daily. 11/29/23   Dettinger, Fonda LABOR, MD  atorvastatin  (LIPITOR) 80 MG tablet Take 1 tablet (80 mg total) by mouth daily at 6 PM. 08/09/12 08/17/12  Lelon Hamilton T, PA-C     Allergies:    Allergies  Allergen Reactions   Lipitor [Atorvastatin ] Hives   Lopressor  [Metoprolol  Tartrate] Shortness Of Breath    Felt like he was going to die , could not breath & syncope   Egg Protein-Containing Drug Products Other (See Comments)    Really bad stomach pain   Lactose Intolerance (Gi) Diarrhea    Social History:   Social History   Socioeconomic History   Marital status: Divorced    Spouse name: Not on file   Number of children: Not on file   Years of education: Not on file   Highest education level: Not on file  Occupational History   Not on file  Tobacco Use   Smoking status: Heavy Smoker    Current packs/day: 1.00    Average packs/day: 1 pack/day for 17.0 years (17.0 ttl pk-yrs)    Types: Cigarettes   Smokeless tobacco: Never   Tobacco comments:    08/09/2012 smoked 3 ppd til 1 month ago   Substance and Sexual Activity   Alcohol use: No    Alcohol/week: 0.0 standard drinks of alcohol    Comment: 08/09/2012 had a mixed drink once in the past year   Drug use: No   Sexual activity: Yes  Other Topics Concern   Not on file  Social History Narrative   Not on file   Social Drivers of Health   Financial Resource Strain: Not on file  Food Insecurity: No Food Insecurity (12/04/2023)   Hunger Vital Sign    Worried About Running Out of Food in the Last Year: Never true     Ran Out of Food in the Last Year: Never true  Transportation Needs: No Transportation Needs (12/04/2023)   PRAPARE - Administrator, Civil Service (Medical): No    Lack of Transportation (Non-Medical): No  Physical Activity: Not on file  Stress: Not on file  Social Connections: Not on file  Intimate Partner Violence: Not At Risk (12/04/2023)   Humiliation, Afraid, Rape, and Kick questionnaire    Fear of Current or Ex-Partner: No    Emotionally Abused: No    Physically Abused: No    Sexually Abused: No     Family History:   The  patient's family history includes Heart attack (age of onset: 68) in his father, paternal grandfather, and paternal uncle.    ROS:  Please see the history of present illness.  All other ROS reviewed and negative.     Physical Exam/Data: Vitals:   12/04/23 1214 12/04/23 1219 12/04/23 1224 12/04/23 1300  BP: 115/70 111/69 119/72 103/68  Pulse: 68 69 68   Resp: 18 (!) 23 (!) 21 17  SpO2: 100% 100% 100%    No intake or output data in the 24 hours ending 12/04/23 1408    11/29/2023   11:18 AM 11/25/2023   12:13 PM 08/28/2023    8:23 AM  Last 3 Weights  Weight (lbs) 216 lb 6.4 oz 206 lb 240 lb  Weight (kg) 98.158 kg 93.441 kg 108.863 kg     There is no height or weight on file to calculate BMI.   Exam per MD  EKG:  The ECG that was done 12/04/2023 was personally reviewed and demonstrates sinus bradycardia, 54 bpm, 3mm ST elevation in lead III, aVF, v3 reciprocal depression in lead I and aVL.   Relevant CV Studies:  Echo: 08/2012  Study Conclusions   - Left ventricle: The cavity size was normal. Wall thickness    was normal. Systolic function was normal. The estimated    ejection fraction was in the range of 55% to 60%. Wall    motion was normal; there were no regional wall motion    abnormalities. Left ventricular diastolic function    parameters were normal.  - Left atrium: The atrium was mildly dilated.   Laboratory Data: High  Sensitivity Troponin:   Recent Labs  Lab 12/04/23 1215  TROPONINIHS 1,264*      Chemistry Recent Labs  Lab 12/04/23 1215 12/04/23 1216  NA 130* 133*  K 4.0 4.2  CL 102 101  CO2 18*  --   GLUCOSE 244* 238*  BUN 9 11  CREATININE 0.89 0.80  CALCIUM  8.4*  --   GFRNONAA >60  --   ANIONGAP 10  --     Recent Labs  Lab 12/04/23 1215  PROT 5.6*  ALBUMIN 3.4*  AST 40  ALT 33  ALKPHOS 36*  BILITOT 0.8   Lipids  Recent Labs  Lab 12/04/23 1215  CHOL 104  TRIG 86  HDL 34*  LDLCALC 53  CHOLHDL 3.1   Hematology Recent Labs  Lab 12/04/23 1215 12/04/23 1216  WBC 9.7  --   RBC 4.32  --   HGB 13.4 13.3  HCT 39.5 39.0  MCV 91.4  --   MCH 31.0  --   MCHC 33.9  --   RDW 12.2  --   PLT 154  --    Thyroid  No results for input(s): TSH, FREET4 in the last 168 hours. BNPNo results for input(s): BNP, PROBNP in the last 168 hours.  DDimer No results for input(s): DDIMER in the last 168 hours.  Radiology/Studies:  CARDIAC CATHETERIZATION Result Date: 12/04/2023   1st Diag lesion is 85% stenosed.   Prox RCA to Dist RCA lesion is 100% stenosed.   1st Mrg lesion is 50% stenosed.   The left ventricular ejection fraction is 35-45% by visual estimate. Occlusion of the proximal RCA. 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 85% stenosis in first diagonal Aneurysmal LAD Moderate LV dysfunction. EF 40-45% with basal to mid inferior akinesis Mildly elevated LVEDP 18 mm Hg Plan: recommend medical management. Reviewed prior cath study from 2016. Given  marked aneurysmal dilation of the entire RCA, marked tortuosity and calcification I don't think there is a PCI solution. 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 There are collaterals to the PDA. He is also presenting late in the course of his infarct.     Assessment and Plan:  Jason Morris is a 48 y.o. male with PMH of CAC, aneurysmal dilatation of RCA, hypertension, hyperlipidemia,  diabetes, tobacco use who is being seen 12/04/2023 for the evaluation of STEMI.  STEMI CAD with prior aneurysmal disease of the LAD/RCA/Lcx -- Prior cardiac catheterization in 2016 with severe aneurysmal dilatation of the RCA along with LAD and circumflex.  Was treated with aspirin  and Plavix .  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  EKG in the field shows ST elevation in inferior leads with reciprocal depression in lead I and aVL.  Code STEMI activated in the field.  Brought directly to the Cath Lab for further evaluation.  Further recommendations pending procedure, dissipate routine post MI care.  HTN -- BP stable but soft on admission.  Home lisinopril  40 mg held on admission  Hyperlipidemia -- Lipid panel 08/2023 with LDL 103, HDL 34 -- On Crestor  20 mg PTA  DM -- A1c 06/2023 5.2 --Notes from primary care indicate he was having some episodes of hypoglycemia and Mounjaro was reduced to 5 mg  Tobacco use -- Cessation advised  Risk Assessment/Risk Scores:  TIMI Risk Score for ST  Elevation MI:   The patient's TIMI risk score is 1, which indicates a 1.6% risk of all cause mortality at 30 days.  Code Status: Full Code  Severity of Illness: The appropriate patient status for this patient is INPATIENT. Inpatient status is judged to be reasonable and necessary in order to provide the required intensity of service to ensure the patient's safety. The patient's presenting symptoms, physical exam findings, and initial radiographic and laboratory data in the context of their chronic comorbidities is felt to place them at high risk for further clinical deterioration. Furthermore, it is not anticipated that the patient will be medically stable for discharge from the hospital within 2 midnights of admission.   * I certify that at the point of admission it is my clinical judgment that the patient will require inpatient hospital  care spanning beyond 2 midnights from the point of admission due to high intensity of service, high risk for further deterioration and high frequency of surveillance required.*  For questions or updates, please contact  [facility] Please consult [email or web address] for contact info under     Signed, Jason Rummer, NP  12/04/2023 2:08 PM

## 2023-12-04 NOTE — Procedures (Signed)
 Central Venous Catheter Insertion Procedure Note  BURNIE THERIEN  [id number]  Jul 04, 1975  Date:12/04/23  Time:2:20 PM   Provider Performing:Pete FORBES Jenna   Procedure: Insertion of Non-tunneled Central Venous [phone]) with US  guidance (23062)   Indication(s) Medication administration and Difficult access  Consent Unable to obtain consent due to emergent nature of procedure.  Anesthesia Topical only with 1% lidocaine    Timeout Verified patient identification, verified procedure, site/side was marked, verified correct patient position, special equipment/implants available, medications/allergies/relevant history reviewed, required imaging and test results available.  Sterile Technique Maximal sterile technique including full sterile barrier drape, hand hygiene, sterile gown, sterile gloves, mask, hair covering, sterile ultrasound probe cover (if used).  Procedure Description Area of catheter insertion was cleaned with chlorhexidine  and draped in sterile fashion.  With real-time ultrasound guidance a central venous catheter was placed into the right internal jugular vein. Nonpulsatile blood flow and easy flushing noted in all ports.  The catheter was sutured in place and sterile dressing applied.  Complications/Tolerance None; patient tolerated the procedure well. Chest X-ray is ordered to verify placement for internal jugular or subclavian cannulation.   Chest x-ray is not ordered for femoral cannulation.  EBL Minimal  Specimen(s) None

## 2023-12-04 NOTE — Consult Note (Signed)
 NAME:  Jason Morris, MRN:  [id number], DOB:  03/04/1975, LOS: 0 ADMISSION DATE:  12/04/2023, CONSULTATION DATE:  12/04/23 REFERRING MD:  AHF, CHIEF COMPLAINT:  chest pain   History of Present Illness:  48 year old man w/ hx DM and known aneurysmal coronary artery disease presenting with chest pain found to have inferior STEMI; cath anatomy thought best treated with medical therapy.  Ongoing chest pain, given NTG and BP dropped precipitously. PCCM consulted in this setting.  Pertinent  Medical History  DM2 Known aneurysmal CAD query Kawasaki  Significant Hospital Events: Including procedures, antibiotic start and stop dates in addition to other pertinent events   11/25 admit, cath  Interim History / Subjective:  consult  Objective    Blood pressure 103/68, pulse 68, resp. rate 17, SpO2 100%.       No intake or output data in the 24 hours ending 12/04/23 1355 There were no vitals filed for this visit.  Examination: General: ill appearing HENT: MMM, trachea midline Lungs: clear, no wheezing, no accessory muscle use Cardiovascular: regular, sinus on monitor Abdomen: soft, +BS Extremities: no edema Neuro: moves everything to command Skin: no rashes, radial access site looks okay  Resolved problem list   Assessment and Plan   Inferior STEMI complicated by cardiogenic shock in patient with known aneurysmal RCA dilation acute on chronic Hx HTN, HLD, DM2 - Antiplatelet +/- AC, GDMT per interventional cardiology - CVL: CVP, coox check, inotropes PRN - Usual post cath wrist checks and pressure - Will follow with you until stabilizes - SSI  Labs   CBC: Recent Labs  Lab 12/04/23 1215 12/04/23 1216  WBC 9.7  --   NEUTROABS 7.7  --   HGB 13.4 13.3  HCT 39.5 39.0  MCV 91.4  --   PLT 154  --     Basic Metabolic Panel: Recent Labs  Lab 12/04/23 1215 12/04/23 1216  NA 130* 133*  K 4.0 4.2  CL 102 101  CO2 18*  --   GLUCOSE 244* 238*  BUN 9 11  CREATININE 0.89  0.80  CALCIUM  8.4*  --    GFR: Estimated Creatinine Clearance: 137.9 mL/min (by C-G formula based on SCr of 0.8 mg/dL). Recent Labs  Lab 12/04/23 1215 12/04/23 1217  WBC 9.7  --   LATICACIDVEN  --  0.8    Liver Function Tests: Recent Labs  Lab 12/04/23 1215  AST 40  ALT 33  ALKPHOS 36*  BILITOT 0.8  PROT 5.6*  ALBUMIN 3.4*   No results for input(s): LIPASE, AMYLASE in the last 168 hours. No results for input(s): AMMONIA in the last 168 hours.  ABG    Component Value Date/Time   TCO2 20 (L) 12/04/2023 1216     Coagulation Profile: Recent Labs  Lab 12/04/23 1215  INR 1.3*    Cardiac Enzymes: No results for input(s): CKTOTAL, CKMB, CKMBINDEX, TROPONINI in the last 168 hours.  HbA1C: HB A1C (BAYER DCA - WAIVED)  Date/Time Value Ref Range Status  08/28/2023 08:56 AM 5.2 4.8 - 5.6 % Final    Comment:             Prediabetes: 5.7 - 6.4          Diabetes: >6.4          Glycemic control for adults with diabetes: <7.0   05/23/2023 08:02 AM 5.2 4.8 - 5.6 % Final    Comment:  Prediabetes: 5.7 - 6.4          Diabetes: >6.4          Glycemic control for adults with diabetes: <7.0     CBG: No results for input(s): GLUCAP in the last 168 hours.  Review of Systems:    Positive Symptoms in bold:  Constitutional fevers, chills, weight loss, fatigue, anorexia, malaise  Eyes decreased vision, double vision, eye irritation  Ears, Nose, Mouth, Throat sore throat, trouble swallowing, sinus congestion  Cardiovascular chest pain, paroxysmal nocturnal dyspnea, lower ext edema, palpitations   Respiratory SOB, cough, DOE, hemoptysis, wheezing  Gastrointestinal nausea, vomiting, diarrhea  Genitourinary burning with urination, trouble urinating  Musculoskeletal joint aches, joint swelling, back pain  Integumentary  rashes, skin lesions  Neurological focal weakness, focal numbness, trouble speaking, headaches  Psychiatric depression, anxiety,  confusion  Endocrine polyuria, polydipsia, cold intolerance, heat intolerance  Hematologic abnormal bruising, abnormal bleeding, unexplained nose bleeds  Allergic/Immunologic recurrent infections, hives, swollen lymph nodes     Past Medical History:  He,  has a past medical history of Anxiety, CAD (coronary artery disease), Chronic lower back pain, Depression, GERD (gastroesophageal reflux disease), HLD (hyperlipidemia), Hypertension, Migraines, and Tobacco abuse.   Surgical History:   Past Surgical History:  Procedure Laterality Date   LEFT HEART CATH N/A 02/21/2014   Procedure: LEFT HEART CATH;  Surgeon: Lonni JONETTA Cash, MD;  Location: [facility] CATH LAB;  Service: Cardiovascular;  Laterality: N/A;   MYRINGOTOMY WITH TUBE PLACEMENT Bilateral    4-5 times as a child (08/09/2012)     Social History:   reports that he has been smoking cigarettes. He has a 17 pack-year smoking history. He has never used smokeless tobacco. He reports that he does not drink alcohol and does not use drugs.   Family History:  His family history includes Heart attack (age of onset: 67) in his father, paternal grandfather, and paternal uncle.   Allergies Allergies  Allergen Reactions   Lipitor [Atorvastatin ] Hives   Lopressor  [Metoprolol  Tartrate] Shortness Of Breath    Felt like he was going to die , could not breath & syncope   Egg Protein-Containing Drug Products Other (See Comments)    Really bad stomach pain   Lactose Intolerance (Gi) Diarrhea     Home Medications  Prior to Admission medications   Medication Sig Start Date End Date Taking? Authorizing Provider  clopidogrel  (PLAVIX ) 75 MG tablet Take 1 tablet (75 mg total) by mouth daily. 05/23/23   Dettinger, Fonda LABOR, MD  esomeprazole  (NEXIUM ) 40 MG capsule Take 1 capsule (40 mg total) by mouth daily. 11/29/23   Dettinger, Fonda LABOR, MD  lisinopril  (ZESTRIL ) 40 MG tablet Take 1 tablet (40 mg total) by mouth daily. 05/23/23   Dettinger, Fonda LABOR,  MD  Multiple Vitamin (MULTIVITAMIN) tablet Take 1 tablet by mouth daily.    [provider]  nabumetone  (RELAFEN ) 500 MG tablet Take 2 tablets (1,000 mg total) by mouth 2 (two) times daily. For muscle and joint pain 11/23/22   Dettinger, Fonda LABOR, MD  PARoxetine  (PAXIL ) 40 MG tablet Take 1 tablet (40 mg total) by mouth daily. 08/28/23   Dettinger, Fonda LABOR, MD  rosuvastatin  (CRESTOR ) 20 MG tablet TAKE ONE TABLET DAILY 12/02/23   Dettinger, Fonda LABOR, MD  sildenafil  (REVATIO ) 20 MG tablet Take 1-5 tablets (20-100 mg total) by mouth as needed. 05/23/23   Dettinger, Fonda LABOR, MD  tirzepatide [facility]) 7.5 MG/0.5ML Pen Inject 7.5 mg into the skin once a  week.    [provider]  traZODone  (DESYREL ) 100 MG tablet Take 0.5-1 tablets (50-100 mg total) by mouth at bedtime as needed for sleep. 05/23/23   Dettinger, Fonda LABOR, MD  varenicline  (CHANTIX ) 1 MG tablet Take 1 tablet (1 mg total) by mouth 2 (two) times daily. 11/29/23   Dettinger, Fonda LABOR, MD  atorvastatin  (LIPITOR) 80 MG tablet Take 1 tablet (80 mg total) by mouth daily at 6 PM. 08/09/12 08/17/12  Lelon Hamilton T, PA-C     Critical care time: 31 min

## 2023-12-04 NOTE — Progress Notes (Signed)
 eLink Physician-Brief Progress Note Patient Name: JOVI ZAVADIL DOB: December 13, 1975 MRN: [id number]   Date of Service  12/04/2023  HPI/Events of Note  Pharmacist secure chat for nicotine  patch.  eICU Interventions  STEMI.s/p LHC.   Ok to go on  for low dose 7 mcg patch daily.      Intervention Category Minor Interventions: Other:  Jodelle ONEIDA Hutching 12/04/2023, 9:40 PM  00:19 Can the pt get something added for pain other than the 12.5 of fentanyl ? Per the nurse, it doesn't seem to controll his pain very well. Got 3 times fentanyl .   Camera:  Resting. VS stable.  Levo at 5, amiodarone .  As per RN, chest pain 5 to 8 on and off.  Oxycodone  5 mg oral once

## 2023-12-05 ENCOUNTER — Encounter (HOSPITAL_COMMUNITY): Payer: Self-pay | Admitting: Cardiology

## 2023-12-05 LAB — BASIC METABOLIC PANEL WITH GFR
Anion gap: 8 (ref 5–15)
BUN: 7 mg/dL (ref 6–20)
CO2: 25 mmol/L (ref 22–32)
Calcium: 8.9 mg/dL (ref 8.9–10.3)
Chloride: 105 mmol/L (ref 98–111)
Creatinine, Ser: 0.87 mg/dL (ref 0.61–1.24)
GFR, Estimated: >60 mL/min (ref >60)
Glucose, Bld: 106 mg/dL — ABNORMAL HIGH (ref 70–99)
Potassium: 3.9 mmol/L (ref 3.5–5.1)
Sodium: 138 mmol/L (ref 135–145)

## 2023-12-05 LAB — COOXEMETRY PANEL
Carboxyhemoglobin: 1.7 % — ABNORMAL HIGH (ref 0.5–1.5)
Methemoglobin: 0.7 % (ref 0.0–1.5)
O2 Saturation: 55.9 %
Total hemoglobin: 14.2 g/dL (ref 12.0–16.0)

## 2023-12-05 LAB — LIPID PANEL
Cholesterol: 106 mg/dL (ref 0–200)
HDL: 34 mg/dL — ABNORMAL LOW (ref >40)
LDL Cholesterol: 60 mg/dL (ref 0–99)
Total CHOL/HDL Ratio: 3.1 ratio
Triglycerides: 58 mg/dL (ref <150)
VLDL: 12 mg/dL (ref 0–40)

## 2023-12-05 LAB — HEMOGLOBIN A1C
Hgb A1c MFr Bld: 5.6 % (ref 4.8–5.6)
Mean Plasma Glucose: 114 mg/dL

## 2023-12-05 LAB — CBC
HCT: 41.3 % (ref 39.0–52.0)
Hemoglobin: 13.7 g/dL (ref 13.0–17.0)
MCH: 30.2 pg (ref 26.0–34.0)
MCHC: 33.2 g/dL (ref 30.0–36.0)
MCV: 91.2 fL (ref 80.0–100.0)
Platelets: 156 K/uL (ref 150–400)
RBC: 4.53 MIL/uL (ref 4.22–5.81)
RDW: 12.3 % (ref 11.5–15.5)
WBC: 8.4 K/uL (ref 4.0–10.5)
nRBC: 0 % (ref 0.0–0.2)

## 2023-12-05 LAB — GLUCOSE, CAPILLARY
Glucose-Capillary: 102 mg/dL — ABNORMAL HIGH (ref 70–99)
Glucose-Capillary: 103 mg/dL — ABNORMAL HIGH (ref 70–99)
Glucose-Capillary: 108 mg/dL — ABNORMAL HIGH (ref 70–99)
Glucose-Capillary: 118 mg/dL — ABNORMAL HIGH (ref 70–99)
Glucose-Capillary: 128 mg/dL — ABNORMAL HIGH (ref 70–99)
Glucose-Capillary: 130 mg/dL — ABNORMAL HIGH (ref 70–99)

## 2023-12-05 LAB — MAGNESIUM
Magnesium: 2.1 mg/dL (ref 1.7–2.4)
Magnesium: 2.3 mg/dL (ref 1.7–2.4)

## 2023-12-05 LAB — MRSA NEXT GEN BY PCR, NASAL: MRSA by PCR Next Gen: NOT DETECTED

## 2023-12-05 MED ORDER — NALOXONE HCL 0.4 MG/ML IJ SOLN
0.4000 mg | INTRAMUSCULAR | Status: DC | PRN
Start: 2023-12-05 — End: 2023-12-06

## 2023-12-05 MED ORDER — ORAL CARE MOUTH RINSE: 15.0000 mL | OROMUCOSAL | Status: DC | PRN

## 2023-12-05 MED ORDER — OXYCODONE HCL 5 MG PO TABS
5.0000 mg | ORAL_TABLET | Freq: Once | ORAL | Status: AC | PRN
  Administered 2023-12-05: 5 mg via ORAL
  Filled 2023-12-05: qty 1

## 2023-12-05 MED ORDER — CHLORHEXIDINE GLUCONATE CLOTH 2 % EX PADS
6.0000 | MEDICATED_PAD | Freq: Every day | CUTANEOUS | Status: DC
  Administered 2023-12-05 – 2023-12-06 (×2): 6 via TOPICAL

## 2023-12-05 MED ORDER — ALUM & MAG HYDROXIDE-SIMETH 200-200-20 MG/5ML PO SUSP
30.0000 mL | Freq: Once | ORAL | Status: AC
  Administered 2023-12-05: 30 mL via ORAL
  Filled 2023-12-05: qty 30

## 2023-12-05 MED ORDER — POTASSIUM CHLORIDE CRYS ER 20 MEQ PO TBCR
20.0000 meq | EXTENDED_RELEASE_TABLET | Freq: Once | ORAL | Status: AC
  Administered 2023-12-05: 20 meq via ORAL
  Filled 2023-12-05: qty 1

## 2023-12-05 MED ORDER — SPIRONOLACTONE 12.5 MG HALF TABLET
12.5000 mg | ORAL_TABLET | Freq: Every day | ORAL | Status: DC
  Administered 2023-12-05 – 2023-12-06 (×2): 12.5 mg via ORAL
  Filled 2023-12-05 (×2): qty 1

## 2023-12-05 MED ORDER — TRAZODONE HCL 50 MG PO TABS
50.0000 mg | ORAL_TABLET | Freq: Once | ORAL | Status: AC
  Administered 2023-12-05: 50 mg via ORAL
  Filled 2023-12-05: qty 1

## 2023-12-05 NOTE — Progress Notes (Addendum)
 Advanced Heart Failure Rounding Note   Subjective:    Admitted 11/26 with late presenting inferior MI with RV involvement. Unable to open RCA  Overnight developed NSVT. Started on IV amio and given Mag  Off NE this am  Denies CP  Objective:   Weight Range:  Vital Signs:   Temp:  [98.1 F (36.7 C)-98.2 F (36.8 C)] 98.1 F (36.7 C) (11/27 0749) Pulse Rate:  [52-87] 70 (11/27 0900) Resp:  [7-30] 16 (11/27 0900) BP: (60-120)/(28-95) 117/76 (11/27 0900) SpO2:  [89 %-100 %] 98 % (11/27 0900) Last BM Date : 12/05/23   Intake/Output:   Intake/Output Summary (Last 24 hours) at 12/05/2023 1109 Last data filed at 12/05/2023 0800 Gross per 24 hour  Intake 659.34 ml  Output 3100 ml  Net -2440.66 ml     Physical Exam: General:  Sitting up in bed. No resp difficulty HEENT: normal Neck: supple. no JVD.  Cor: Regular rate & rhythm. No rubs, gallops or murmurs. Lungs: clear Abdomen: soft, nontender, nondistended.Good bowel sounds. Extremities: no cyanosis, clubbing, rash, edema Neuro: alert & orientedx3, cranial nerves grossly intact. moves all 4 extremities w/o difficulty. Affect pleasant   Telemetry: Sinus 70s NSVT overnight Personally reviewed  Labs: Basic Metabolic Panel: Recent Labs  Lab 12/04/23 1215 12/04/23 1216 12/04/23 1444 12/04/23 2341  NA 130* 133*  --   --   K 4.0 4.2  --   --   CL 102 101  --   --   CO2 18*  --   --   --   GLUCOSE 244* 238*  --   --   BUN 9 11  --   --   CREATININE 0.89 0.80 0.92  --   CALCIUM  8.4*  --   --   --   MG  --   --   --  2.3    Liver Function Tests: Recent Labs  Lab 12/04/23 1215  AST 40  ALT 33  ALKPHOS 36*  BILITOT 0.8  PROT 5.6*  ALBUMIN 3.4*   No results for input(s): LIPASE, AMYLASE in the last 168 hours. No results for input(s): AMMONIA in the last 168 hours.  CBC: Recent Labs  Lab 12/04/23 1215 12/04/23 1216 12/04/23 1444  WBC 9.7  --  13.1*  NEUTROABS 7.7  --   --   HGB 13.4 13.3  13.5  HCT 39.5 39.0 39.6  MCV 91.4  --  90.2  PLT 154  --  191    Cardiac Enzymes: No results for input(s): CKTOTAL, CKMB, CKMBINDEX, TROPONINI in the last 168 hours.  BNP: BNP (last 3 results) No results for input(s): BNP in the last 8760 hours.  ProBNP (last 3 results) No results for input(s): PROBNP in the last 8760 hours.    Other results:  Imaging: ECHOCARDIOGRAM COMPLETE Result Date: 12/04/2023    ECHOCARDIOGRAM REPORT   Patient Name:   Jason Morris Date of Exam: 12/04/2023 Medical Rec #:  [id number]     Height:       71.0 in Accession #:    [id number]    Weight:       216.4 lb Date of Birth:  04/08/1975     BSA:          2.180 m Patient Age:    48 years      BP:           94/65 mmHg Patient Gender: M  HR:           59 bpm. Exam Location:  Inpatient Procedure: 2D Echo, Cardiac Doppler and Color Doppler (Both Spectral and Color            Flow Doppler were utilized during procedure). Indications:    Acute myocardial infarction  History:        Patient has prior history of Echocardiogram examinations, most                 recent 08/09/2012. Acute MI and CAD; Risk Factors:Hypertension,                 Current Smoker and Diabetes.  Sonographer:    VALENTE, ADAM Referring Phys: [email or web address] PETER M JORDAN  Sonographer Comments: Patient is obese. IMPRESSIONS  1. Left ventricular ejection fraction, by estimation, is 40 to 45%. The left ventricle has mildly decreased function. The left ventricle demonstrates regional wall motion abnormalities (see scoring diagram/findings for description). There is mild left ventricular hypertrophy. Left ventricular diastolic parameters are indeterminate.  2. Right ventricular systolic function is mildly reduced. The right ventricular size is normal. Tricuspid regurgitation signal is inadequate for assessing PA pressure.  3. The mitral valve is normal in structure. Trivial mitral valve regurgitation. No evidence of mitral stenosis.  4. The aortic  valve is tricuspid. Aortic valve regurgitation is not visualized. No aortic stenosis is present.  5. The inferior vena cava is dilated in size with >50% respiratory variability, suggesting right atrial pressure of 8 mmHg. FINDINGS  Left Ventricle: Left ventricular ejection fraction, by estimation, is 40 to 45%. The left ventricle has mildly decreased function. The left ventricle demonstrates regional wall motion abnormalities. The left ventricular internal cavity size was normal in size. There is mild left ventricular hypertrophy. Left ventricular diastolic parameters are indeterminate.  LV Wall Scoring: The inferior wall, mid inferoseptal segment, and basal inferoseptal segment are akinetic. The entire anterior wall, entire lateral wall, entire anterior septum, and entire apex are normal. Right Ventricle: The right ventricular size is normal. No increase in right ventricular wall thickness. Right ventricular systolic function is mildly reduced. Tricuspid regurgitation signal is inadequate for assessing PA pressure. Left Atrium: Left atrial size was normal in size. Right Atrium: Right atrial size was normal in size. Pericardium: There is no evidence of pericardial effusion. Mitral Valve: The mitral valve is normal in structure. Trivial mitral valve regurgitation. No evidence of mitral valve stenosis. Tricuspid Valve: The tricuspid valve is normal in structure. Tricuspid valve regurgitation is trivial. Aortic Valve: The aortic valve is tricuspid. Aortic valve regurgitation is not visualized. No aortic stenosis is present. Aortic valve mean gradient measures 2.0 mmHg. Aortic valve peak gradient measures 4.7 mmHg. Aortic valve area, by VTI measures 3.95 cm. Pulmonic Valve: The pulmonic valve was not well visualized. Pulmonic valve regurgitation is not visualized. Aorta: The aortic root is normal in size and structure. Venous: The inferior vena cava is dilated in size with greater than 50% respiratory variability,  suggesting right atrial pressure of 8 mmHg. IAS/Shunts: The interatrial septum was not well visualized.  LEFT VENTRICLE PLAX 2D LVIDd:         4.80 cm      Diastology LVIDs:         3.60 cm      LV e' medial:    9.48 cm/s LV PW:         1.10 cm      LV E/e' medial:  8.0 LV IVS:  0.90 cm      LV e' lateral:   9.32 cm/s LVOT diam:     2.40 cm      LV E/e' lateral: 8.1 LV SV:         90 LV SV Index:   41 LVOT Area:     4.52 cm  LV Volumes (MOD) LV vol d, MOD A4C: 154.0 ml LV vol s, MOD A4C: 79.8 ml LV SV MOD A4C:     154.0 ml RIGHT VENTRICLE             IVC RV Basal diam:  3.70 cm     IVC diam: 2.00 cm RV Mid diam:    2.90 cm RV S prime:     10.90 cm/s TAPSE (M-mode): 1.8 cm LEFT ATRIUM             Index        RIGHT ATRIUM           Index LA diam:        3.80 cm 1.74 cm/m   RA Area:     15.30 cm LA Vol (A2C):   56.1 ml 25.73 ml/m  RA Volume:   45.70 ml  20.96 ml/m LA Vol (A4C):   39.9 ml 18.30 ml/m LA Biplane Vol: 51.8 ml 23.76 ml/m  AORTIC VALVE AV Area (Vmax):    4.06 cm AV Area (Vmean):   4.02 cm AV Area (VTI):     3.95 cm AV Vmax:           108.00 cm/s AV Vmean:          70.400 cm/s AV VTI:            0.229 m AV Peak Grad:      4.7 mmHg AV Mean Grad:      2.0 mmHg LVOT Vmax:         97.00 cm/s LVOT Vmean:        62.600 cm/s LVOT VTI:          0.200 m LVOT/AV VTI ratio: 0.87  AORTA Ao Root diam: 3.60 cm MITRAL VALVE MV Area (PHT): 3.77 cm    SHUNTS MV Decel Time: 201 msec    Systemic VTI:  0.20 m MV E velocity: 75.70 cm/s  Systemic Diam: 2.40 cm MV A velocity: 40.70 cm/s MV E/A ratio:  1.86 Lonni Nanas MD Electronically signed by Lonni Nanas MD Signature Date/Time: 12/04/2023/6:35:49 PM    Final    DG Chest Port 1 View Result Date: 12/04/2023 CLINICAL DATA:  Central line placement. EXAM: PORTABLE CHEST 1 VIEW COMPARISON:  Chest radiograph dated 11/25/2023. FINDINGS: Right IJ central venous line with tip over central SVC. No focal consolidation, pleural effusion or  pneumothorax. Stable cardiac silhouette. No acute osseous pathology. IMPRESSION: Right IJ central venous line with tip over central SVC. No pneumothorax. Electronically Signed   By: Vanetta Chou M.D.   On: 12/04/2023 16:29   CARDIAC CATHETERIZATION Result Date: 12/04/2023   1st Diag lesion is 85% stenosed.   Prox RCA to Dist RCA lesion is 100% stenosed.   1st Mrg lesion is 50% stenosed.   The left ventricular ejection fraction is 35-45% by visual estimate. Occlusion of the proximal RCA. 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 85% stenosis in first diagonal Aneurysmal LAD Moderate LV dysfunction. EF 40-45% with basal to mid inferior akinesis Mildly elevated LVEDP 18 mm Hg Plan: recommend medical management. Reviewed prior cath study from 2016. Given  marked aneurysmal dilation of the entire RCA, marked tortuosity and calcification I don't think there is a PCI solution. 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 There are collaterals to the PDA. He is also presenting late in the course of his infarct.     Medications:     Scheduled Medications:  aspirin  EC  81 mg Oral Daily   Chlorhexidine  Gluconate Cloth  6 each Topical Daily   clopidogrel   75 mg Oral Daily   enoxaparin  (LOVENOX ) injection  40 mg Subcutaneous Q24H   insulin  aspart  0-15 Units Subcutaneous Q4H   multivitamin with minerals  1 tablet Oral Daily   nicotine   7 mg Transdermal Daily   pantoprazole  (PROTONIX ) IV  40 mg Intravenous Q12H   PARoxetine   40 mg Oral QHS   rosuvastatin   40 mg Oral QHS   sodium chloride  flush  3 mL Intravenous Q12H    Infusions:  sodium chloride      sodium chloride      amiodarone  30 mg/hr (12/05/23 0800)   norepinephrine  (LEVOPHED ) Adult infusion Stopped (12/05/23 0334)    PRN Medications: sodium chloride , acetaminophen , fentaNYL  (SUBLIMAZE ) injection, naLOXone  (NARCAN )  injection, ondansetron  (ZOFRAN ) IV, mouth rinse, sodium chloride   flush   Assessment/Plan:    1. CAD/ Late Presentation Inferior MI with RV involvement - Cath 11/25 culprit pRCA occlusion w/ collaterals to PDA, no intervention. Medical management  - Aneurysmal disease of LAD, RCA severely aneurysmal, calcified and extremely tortuous. 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 Acute Systolic Heart Failure - cath LV gram EF 40-45%, basal to mid inferior AK in setting or RV infarct. LVEDP 18  - Echo EF 40-45% inferior AK RV mild to moderately reduced - Off NE - Co-ox marginal 56% CVP 5 - continue digoxin 0.125 mg  - add GDMT slowly. Will spiro 12.5  3. PVCs/NSVT - continue IV amio for now - Keep K> 4.0 Mg > 2.0   4. HLD - LDL 53 - rosuvastatin  40 mg    5. Type 2 DM - Hgb A1c 5.6 - SGLT2i prior to d/c   5. 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  Critical care was necessary to treat or prevent imminent or life-threatening deterioration.  Critical care was time spent personally by me (independent of midlevel providers or residents) on the following activities: development of treatment plan with patient and/or surrogate as well as nursing, discussions with consultants, evaluation of patient's response to treatment, examination of patient, obtaining history from patient or surrogate, ordering and performing treatments and interventions, ordering and review of laboratory studies, ordering and review of radiographic studies, pulse oximetry and  re-evaluation of patient's condition.  Length of Stay: 1   Sieving Cherrie MD 12/05/2023, 11:09 AM  Advanced Heart Failure Team Pager [phone] (M-F; 7a - 4p)  Please contact [facility] for night-coverage after hours (4p -7a ) and weekends on [email or web address]

## 2023-12-05 NOTE — Progress Notes (Signed)
 12/05/2023 Discussed with HF, PCCM will be available PRN.

## 2023-12-05 NOTE — Plan of Care (Signed)

## 2023-12-06 ENCOUNTER — Other Ambulatory Visit (HOSPITAL_COMMUNITY): Payer: Self-pay

## 2023-12-06 DIAGNOSIS — I2111 ST elevation (STEMI) myocardial infarction involving right coronary artery: Secondary | ICD-10-CM

## 2023-12-06 LAB — COOXEMETRY PANEL
Carboxyhemoglobin: 1.5 % (ref 0.5–1.5)
Methemoglobin: <0.7 % (ref 0.0–1.5)
O2 Saturation: 68.7 %
Total hemoglobin: 12.4 g/dL (ref 12.0–16.0)

## 2023-12-06 LAB — BASIC METABOLIC PANEL WITH GFR
Anion gap: 8 (ref 5–15)
BUN: 7 mg/dL (ref 6–20)
CO2: 25 mmol/L (ref 22–32)
Calcium: 8.3 mg/dL — ABNORMAL LOW (ref 8.9–10.3)
Chloride: 105 mmol/L (ref 98–111)
Creatinine, Ser: 0.91 mg/dL (ref 0.61–1.24)
GFR, Estimated: >60 mL/min (ref >60)
Glucose, Bld: 109 mg/dL — ABNORMAL HIGH (ref 70–99)
Potassium: 3.9 mmol/L (ref 3.5–5.1)
Sodium: 138 mmol/L (ref 135–145)

## 2023-12-06 LAB — MAGNESIUM: Magnesium: 2 mg/dL (ref 1.7–2.4)

## 2023-12-06 LAB — GLUCOSE, CAPILLARY
Glucose-Capillary: 109 mg/dL — ABNORMAL HIGH (ref 70–99)
Glucose-Capillary: 134 mg/dL — ABNORMAL HIGH (ref 70–99)
Glucose-Capillary: 104 mg/dL — ABNORMAL HIGH (ref 70–99)

## 2023-12-06 MED ORDER — SPIRONOLACTONE 25 MG PO TABS
12.5000 mg | ORAL_TABLET | Freq: Every day | ORAL | 5 refills | Status: DC
  Filled 2023-12-06: qty 30, 60d supply, fill #0

## 2023-12-06 MED ORDER — ASPIRIN 81 MG PO TBEC
81.0000 mg | DELAYED_RELEASE_TABLET | Freq: Every day | ORAL | 12 refills | Status: AC
  Filled 2023-12-06: qty 30, 30d supply, fill #0

## 2023-12-06 MED ORDER — NITROGLYCERIN 0.4 MG SL SUBL
0.4000 mg | SUBLINGUAL_TABLET | SUBLINGUAL | 3 refills | Status: AC | PRN
  Filled 2023-12-06: qty 25, 5d supply, fill #0

## 2023-12-06 MED ORDER — CLOPIDOGREL BISULFATE 75 MG PO TABS
75.0000 mg | ORAL_TABLET | Freq: Every day | ORAL | 3 refills | Status: AC
  Filled 2023-12-06: qty 90, 90d supply, fill #0

## 2023-12-06 MED ORDER — EMPAGLIFLOZIN 10 MG PO TABS
10.0000 mg | ORAL_TABLET | Freq: Every day | ORAL | 5 refills | Status: AC
  Filled 2023-12-06: qty 30, 30d supply, fill #0

## 2023-12-06 MED ORDER — PANTOPRAZOLE SODIUM 40 MG PO TBEC
40.0000 mg | DELAYED_RELEASE_TABLET | Freq: Every day | ORAL | 4 refills | Status: DC
  Filled 2023-12-06: qty 30, 30d supply, fill #0

## 2023-12-06 MED ORDER — ROSUVASTATIN CALCIUM 40 MG PO TABS
40.0000 mg | ORAL_TABLET | Freq: Every day | ORAL | 30 refills | Status: AC
  Filled 2023-12-06: qty 30, 30d supply, fill #0

## 2023-12-06 NOTE — Discharge Summary (Addendum)
 Advanced Heart Failure Team  Discharge Summary   Patient ID: Jason Morris MRN: [id number], DOB/AGE: 46-30-1977 46 y.o. Admit date: 12/04/2023 D/C date:     12/06/2023   Primary Discharge Diagnoses:  CAD Late presenting acute inferior STEMI with RV involvement Acute systolic heart failure with RV infarct PVCs NSVT  Secondary Discharge Diagnoses:  HLD Type 2 DM Tobacco use RAS  Hospital Course:  Jason Morris is a 48 y.o. male with known aneurysmal CAD, HLD, HTN, tobacco abuse and DMII.   Admitted with STEMI. EKG consistent with inferior STE. LHC showed occlusion of the proximal RCA. The vessel is severely aneurysmal, calcified and extremely tortuous. Anatomy not felt amendable to PCI. He has collaterals to the PDA. LAD also aneurysmal and 1st diag w/ 85% stenosis, treated medically. Cath LV gram: EF 40-45% w/ basal to mid inferior AK, LVEDP mildly elevated at 18 mmHg. BP dropped with SL nitroglycerin , briefly required NE. Echo showed EF 40-45%. Had some NSVT overnight and was briefly placed on amiodarone  gtt. Resolved on day of discharge with minimal PVCs, amiodarone  stopped. GDMT started.   Post hospital follow up arranged with [facility]. Meds sent to [facility]. CVC removed.   General:  well appearing.  No respiratory difficulty Neck: JVD flat. RIJ CVC Cor: Regular rate & rhythm. No murmurs. Lungs: clear Extremities: no edema  Neuro: alert & oriented x 3. Affect pleasant.   1. CAD/ Late Presentation Inferior MI with RV involvement - Cath 11/25 culprit pRCA occlusion w/ collaterals to PDA, no intervention. Medical management  - Aneurysmal disease of LAD, RCA severely aneurysmal, calcified and extremely tortuous. 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 Acute Systolic Heart Failure - cath LV gram EF 40-45%, basal to mid  inferior AK in setting or RV infarct. LVEDP 18  - Echo EF 40-45% inferior AK RV mild to moderately reduced - Off NE. Add GDMT slowly.  - Continue spiro 12.5 - Start Jardiance 10 mg daily 3. PVCs/NSVT - Stop IV amio. No PVCs noted on tele review this morning.  - Keep K> 4.0 Mg > 2.0 4. HLD - LDL 53 - Continue rosuvastatin  40 mg  5. Type 2 DM - Hgb A1c 5.6 - SGLT2i started today 5. Tobacco Use - smokes 1ppd  - cessation advised  6. RAS - dopplers in 2015 w/ 1-59% b/l stenosis  - needs re-assessment but can do as outpatient   Discharge Weight Range: 97.5 kg Discharge Vitals: Blood pressure (!) 103/57, pulse 62, temperature 98.6 F (37 C), temperature source Oral, resp. rate 17, weight 97.5 kg, SpO2 96%.  Labs: Lab Results  Component Value Date   WBC 8.4 12/05/2023   HGB 13.7 12/05/2023   HCT 41.3 12/05/2023   MCV 91.2 12/05/2023   PLT 156 12/05/2023    Recent Labs  Lab 12/04/23 1215 12/04/23 1216 12/06/23 0440  NA 130*   < > 138  K 4.0   < > 3.9  CL 102   < > 105  CO2 18*   < > 25  BUN 9   < > 7  CREATININE 0.89   < > 0.91  CALCIUM  8.4*   < > 8.3*  PROT 5.6*  --   --   BILITOT 0.8  --   --  ALKPHOS 36*  --   --   ALT 33  --   --   AST 40  --   --   GLUCOSE 244*   < > 109*   < > = values in this interval not displayed.   Lab Results  Component Value Date   CHOL 106 12/05/2023   HDL 34 (L) 12/05/2023   LDLCALC 60 12/05/2023   TRIG 58 12/05/2023   BNP (last 3 results) No results for input(s): BNP in the last 8760 hours.  ProBNP (last 3 results) No results for input(s): PROBNP in the last 8760 hours.   Diagnostic Studies/Procedures   ECHOCARDIOGRAM COMPLETE Result Date: 12/04/2023    ECHOCARDIOGRAM REPORT   Patient Name:   Jason Morris Date of Exam: 12/04/2023 Medical Rec #:  [id number]     Height:       71.0 in Accession #:    [id number]    Weight:       216.4 lb Date of Birth:  06-Oct-1975     BSA:          2.180 m Patient Age:    48 years       BP:           94/65 mmHg Patient Gender: M             HR:           59 bpm. Exam Location:  Inpatient Procedure: 2D Echo, Cardiac Doppler and Color Doppler (Both Spectral and Color            Flow Doppler were utilized during procedure). Indications:    Acute myocardial infarction  History:        Patient has prior history of Echocardiogram examinations, most                 recent 08/09/2012. Acute MI and CAD; Risk Factors:Hypertension,                 Current Smoker and Diabetes.  Sonographer:    VALENTE, ADAM Referring Phys: IAN.HEAPS PETER M JORDAN  Sonographer Comments: Patient is obese. IMPRESSIONS  1. Left ventricular ejection fraction, by estimation, is 40 to 45%. The left ventricle has mildly decreased function. The left ventricle demonstrates regional wall motion abnormalities (see scoring diagram/findings for description). There is mild left ventricular hypertrophy. Left ventricular diastolic parameters are indeterminate.  2. Right ventricular systolic function is mildly reduced. The right ventricular size is normal. Tricuspid regurgitation signal is inadequate for assessing PA pressure.  3. The mitral valve is normal in structure. Trivial mitral valve regurgitation. No evidence of mitral stenosis.  4. The aortic valve is tricuspid. Aortic valve regurgitation is not visualized. No aortic stenosis is present.  5. The inferior vena cava is dilated in size with >50% respiratory variability, suggesting right atrial pressure of 8 mmHg. FINDINGS  Left Ventricle: Left ventricular ejection fraction, by estimation, is 40 to 45%. The left ventricle has mildly decreased function. The left ventricle demonstrates regional wall motion abnormalities. The left ventricular internal cavity size was normal in size. There is mild left ventricular hypertrophy. Left ventricular diastolic parameters are indeterminate.  LV Wall Scoring: The inferior wall, mid inferoseptal segment, and basal inferoseptal segment are akinetic. The  entire anterior wall, entire lateral wall, entire anterior septum, and entire apex are normal. Right Ventricle: The right ventricular size is normal. No increase in right ventricular wall thickness. Right ventricular systolic function is mildly reduced. Tricuspid regurgitation signal is  inadequate for assessing PA pressure. Left Atrium: Left atrial size was normal in size. Right Atrium: Right atrial size was normal in size. Pericardium: There is no evidence of pericardial effusion. Mitral Valve: The mitral valve is normal in structure. Trivial mitral valve regurgitation. No evidence of mitral valve stenosis. Tricuspid Valve: The tricuspid valve is normal in structure. Tricuspid valve regurgitation is trivial. Aortic Valve: The aortic valve is tricuspid. Aortic valve regurgitation is not visualized. No aortic stenosis is present. Aortic valve mean gradient measures 2.0 mmHg. Aortic valve peak gradient measures 4.7 mmHg. Aortic valve area, by VTI measures 3.95 cm. Pulmonic Valve: The pulmonic valve was not well visualized. Pulmonic valve regurgitation is not visualized. Aorta: The aortic root is normal in size and structure. Venous: The inferior vena cava is dilated in size with greater than 50% respiratory variability, suggesting right atrial pressure of 8 mmHg. IAS/Shunts: The interatrial septum was not well visualized.  LEFT VENTRICLE PLAX 2D LVIDd:         4.80 cm      Diastology LVIDs:         3.60 cm      LV e' medial:    9.48 cm/s LV PW:         1.10 cm      LV E/e' medial:  8.0 LV IVS:        0.90 cm      LV e' lateral:   9.32 cm/s LVOT diam:     2.40 cm      LV E/e' lateral: 8.1 LV SV:         90 LV SV Index:   41 LVOT Area:     4.52 cm  LV Volumes (MOD) LV vol d, MOD A4C: 154.0 ml LV vol s, MOD A4C: 79.8 ml LV SV MOD A4C:     154.0 ml RIGHT VENTRICLE             IVC RV Basal diam:  3.70 cm     IVC diam: 2.00 cm RV Mid diam:    2.90 cm RV S prime:     10.90 cm/s TAPSE (M-mode): 1.8 cm LEFT ATRIUM              Index        RIGHT ATRIUM           Index LA diam:        3.80 cm 1.74 cm/m   RA Area:     15.30 cm LA Vol (A2C):   56.1 ml 25.73 ml/m  RA Volume:   45.70 ml  20.96 ml/m LA Vol (A4C):   39.9 ml 18.30 ml/m LA Biplane Vol: 51.8 ml 23.76 ml/m  AORTIC VALVE AV Area (Vmax):    4.06 cm AV Area (Vmean):   4.02 cm AV Area (VTI):     3.95 cm AV Vmax:           108.00 cm/s AV Vmean:          70.400 cm/s AV VTI:            0.229 m AV Peak Grad:      4.7 mmHg AV Mean Grad:      2.0 mmHg LVOT Vmax:         97.00 cm/s LVOT Vmean:        62.600 cm/s LVOT VTI:          0.200 m LVOT/AV VTI ratio: 0.87  AORTA Ao Root diam: 3.60 cm  MITRAL VALVE MV Area (PHT): 3.77 cm    SHUNTS MV Decel Time: 201 msec    Systemic VTI:  0.20 m MV E velocity: 75.70 cm/s  Systemic Diam: 2.40 cm MV A velocity: 40.70 cm/s MV E/A ratio:  1.86 Lonni Nanas MD Electronically signed by Lonni Nanas MD Signature Date/Time: 12/04/2023/6:35:49 PM    Final    DG Chest Port 1 View Result Date: 12/04/2023 CLINICAL DATA:  Central line placement. EXAM: PORTABLE CHEST 1 VIEW COMPARISON:  Chest radiograph dated 11/25/2023. FINDINGS: Right IJ central venous line with tip over central SVC. No focal consolidation, pleural effusion or pneumothorax. Stable cardiac silhouette. No acute osseous pathology. IMPRESSION: Right IJ central venous line with tip over central SVC. No pneumothorax. Electronically Signed   By: Vanetta Chou M.D.   On: 12/04/2023 16:29   CARDIAC CATHETERIZATION Result Date: 12/04/2023   1st Diag lesion is 85% stenosed.   Prox RCA to Dist RCA lesion is 100% stenosed.   1st Mrg lesion is 50% stenosed.   The left ventricular ejection fraction is 35-45% by visual estimate. Occlusion of the proximal RCA. 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 85% stenosis in first diagonal Aneurysmal LAD Moderate LV dysfunction. EF 40-45% with basal to mid inferior akinesis Mildly elevated LVEDP 18 mm Hg Plan:  recommend medical management. Reviewed prior cath study from 2016. Given marked aneurysmal dilation of the entire RCA, marked tortuosity and calcification I don't think there is a PCI solution. 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 There are collaterals to the PDA. He is also presenting late in the course of his infarct.    Discharge Medications   Allergies as of 12/06/2023       Reactions   Lipitor [atorvastatin ] Hives   Lopressor  [metoprolol  Tartrate] Shortness Of Breath   Felt like he was going to die , could not breath & syncope   Egg Protein-containing Drug Products Other (See Comments)   Really bad stomach pain   Lactose Intolerance (gi) Diarrhea        Medication List     STOP taking these medications    esomeprazole  40 MG capsule Commonly known as: NexIUM    lisinopril  40 MG tablet Commonly known as: ZESTRIL    nabumetone  500 MG tablet Commonly known as: RELAFEN        TAKE these medications    aspirin  EC 81 MG tablet Take 1 tablet (81 mg total) by mouth daily. Swallow whole.   clopidogrel  75 MG tablet Commonly known as: PLAVIX  Take 1 tablet (75 mg total) by mouth daily.   empagliflozin 10 MG Tabs tablet Commonly known as: Jardiance Take 1 tablet (10 mg total) by mouth daily before breakfast.   multivitamin tablet Take 1 tablet by mouth daily.   nitroGLYCERIN  0.4 MG SL tablet Commonly known as: Nitrostat  Place 1 tablet (0.4 mg total) under the tongue every 5 (five) minutes as needed for chest pain.   pantoprazole  40 MG tablet Commonly known as: Protonix  Take 1 tablet (40 mg total) by mouth daily.   PARoxetine  40 MG tablet Commonly known as: PAXIL  Take 1 tablet (40 mg total) by mouth daily. What changed: when to take this   rosuvastatin  40 MG tablet Commonly known as: CRESTOR  Take 1 tablet (40 mg total) by mouth at bedtime. What changed:  medication strength how much to take when to take this   sildenafil  20 MG  tablet Commonly known as: REVATIO  Take 1-5 tablets (20-100 mg total) by  mouth as needed. What changed:  when to take this reasons to take this   spironolactone  25 MG tablet Commonly known as: ALDACTONE  Take 0.5 tablets (12.5 mg total) by mouth daily.   TIRZEPATIDE [place] Inject 68 Units into the skin See admin instructions. Niancinamode-tirzepadide injection; compounded. Inject 68 units into the skin once a week - may titrate up to 91 units once a week.   traZODone  100 MG tablet Commonly known as: DESYREL  Take 0.5-1 tablets (50-100 mg total) by mouth at bedtime as needed for sleep.   varenicline  1 MG tablet Commonly known as: CHANTIX  Take 1 tablet (1 mg total) by mouth 2 (two) times daily.        Disposition   The patient will be discharged in stable condition to home.   Follow-up Information     [facility] of [facility] [employer] Follow up on 12/20/2023.   Specialty: Cardiology Why: 12/12 at 9:15 Contact information: [address] [place]   [id number] [phone]                  APP Duration of Discharge Encounter: ~20 mins  Signed, Beckey LITTIE Coe AGACNP-BC  12/06/2023, 11:37 AM  Patient seen and examined with the above-signed Advanced Practice Provider and/or Housestaff. I personally reviewed laboratory data, imaging studies and relevant notes. I independently examined the patient and formulated the important aspects of the plan. I have edited the note to reflect any of my changes or salient points. I have personally discussed the plan with the patient and/or family.  Much improved today. BP stable. Tolerating meds.   General:  Sitting up in bed. No resp difficulty HEENT: normal Neck: supple. no JVD.  Cor: Regular rate & rhythm. No rubs, gallops or murmurs. Lungs: clear Abdomen: soft, nontender, nondistended.Good bowel sounds. Extremities: no cyanosis, clubbing, rash, edema Neuro: alert & orientedx3, cranial  nerves grossly intact. moves all 4 extremities w/o difficulty. Affect pleasant  Ok for d/c today. Meds reviewed in detail with him and PharmD. Stressed need for smoking cessation. CR has seen.   MD time 38 mins   Toribio Fuel, MD  11:41 AM

## 2023-12-09 ENCOUNTER — Telehealth: Payer: Self-pay

## 2023-12-09 ENCOUNTER — Ambulatory Visit: Payer: Self-pay | Admitting: Family Medicine

## 2023-12-09 DIAGNOSIS — E7841 Elevated Lipoprotein(a): Secondary | ICD-10-CM

## 2023-12-09 LAB — LIPOPROTEIN A (LPA): Lipoprotein (a): 56.1 nmol/L — ABNORMAL HIGH (ref <75.0)

## 2023-12-09 MED ORDER — ESOMEPRAZOLE MAGNESIUM 40 MG PO CPDR: 40.0000 mg | DELAYED_RELEASE_CAPSULE | Freq: Every day | ORAL | 0 refills | Status: DC

## 2023-12-09 NOTE — Telephone Encounter (Signed)
 Copied from CRM #[id number]. Topic: Appointments - Appointment Scheduling >> Dec 09, 2023  8:26 AM Diannia H wrote: Patient/patient representative is calling to schedule an appointment. He is also needing to speak with Dr. Maryanne because patient had a major heart attack and was in the hospital and was discharged on 12/06/2023. Could you assist? Patients callback number is [phone].

## 2023-12-09 NOTE — Telephone Encounter (Signed)
 30d supply of Nexium  sent to [facility]. Pt aware.

## 2023-12-09 NOTE — Addendum Note (Signed)
 Addended by: LEIGH ROSINA SAILOR on: 12/09/2023 04:54 PM   Modules accepted: Orders

## 2023-12-09 NOTE — Telephone Encounter (Signed)
 Yes that is fine to go ahead and send in Nexium  for him instead of Protonix , keep it the same milligrams of Nexium  that he has a Protonix , once daily and give him a month worth

## 2023-12-09 NOTE — Telephone Encounter (Signed)
 [facility] appt scheduled for 12/10 at 8:55 am. Pt states that he has an appt 12/12 with cardiology.  Pt wants to know if Dettinger is alright changing from Protonix  to Nexium . States that Protonix  does not work as well.

## 2023-12-10 ENCOUNTER — Telehealth: Payer: Self-pay | Admitting: Pharmacy Technician

## 2023-12-10 NOTE — Telephone Encounter (Signed)
 Are we prescribing this medication?

## 2023-12-10 NOTE — Telephone Encounter (Signed)
 Dr.Jordan has already left for the day.He will be back Thursday 12/4.I will ask him.

## 2023-12-10 NOTE — Telephone Encounter (Signed)
 Hi, can someone please get the provider to sign the form that is scanned in media under [facility] [employer] PROVIDER APPLICATION for this patient and fax back to [phone]? Thank you!

## 2023-12-12 ENCOUNTER — Telehealth: Payer: Self-pay

## 2023-12-12 NOTE — Telephone Encounter (Addendum)
 Faxed application to [employer]

## 2023-12-12 NOTE — Telephone Encounter (Signed)
 Patient assistance form for Jardiance signed by Dr.Jordan.Faxed to RX Team at fax # [phone].

## 2023-12-12 NOTE — Telephone Encounter (Addendum)
 Faxed jardiance [employer] application

## 2023-12-12 NOTE — Telephone Encounter (Signed)
 Paperwork has been signed and faxed to the [employer].

## 2023-12-17 NOTE — Telephone Encounter (Signed)
 Approved 12/16/23-12/15/24   [id number]

## 2023-12-18 ENCOUNTER — Ambulatory Visit: Payer: Self-pay | Admitting: Family Medicine

## 2023-12-18 ENCOUNTER — Telehealth: Payer: Self-pay

## 2023-12-18 ENCOUNTER — Encounter: Payer: Self-pay | Admitting: Family Medicine

## 2023-12-18 VITALS — BP 124/76 | HR 76 | Ht 71.0 in | Wt 217.0 lb

## 2023-12-18 DIAGNOSIS — I2111 ST elevation (STEMI) myocardial infarction involving right coronary artery: Secondary | ICD-10-CM

## 2023-12-18 DIAGNOSIS — I2510 Atherosclerotic heart disease of native coronary artery without angina pectoris: Secondary | ICD-10-CM

## 2023-12-18 LAB — CMP14+EGFR
ALT: 37 IU/L (ref 0–44)
AST: 19 IU/L (ref 0–40)
Albumin: 4.3 g/dL (ref 4.1–5.1)
Alkaline Phosphatase: 65 IU/L (ref 47–123)
BUN/Creatinine Ratio: 14 (ref 9–20)
BUN: 16 mg/dL (ref 6–24)
Bilirubin Total: 0.3 mg/dL (ref 0.0–1.2)
CO2: 23 mmol/L (ref 20–29)
Calcium: 9.6 mg/dL (ref 8.7–10.2)
Chloride: 101 mmol/L (ref 96–106)
Creatinine, Ser: 1.14 mg/dL (ref 0.76–1.27)
Globulin, Total: 2.8 g/dL (ref 1.5–4.5)
Glucose: 86 mg/dL (ref 70–99)
Potassium: 4.8 mmol/L (ref 3.5–5.2)
Sodium: 138 mmol/L (ref 134–144)
Total Protein: 7.1 g/dL (ref 6.0–8.5)
eGFR: 80 mL/min/1.73 (ref >59)

## 2023-12-18 LAB — CBC WITH DIFF/PLATELET
Basophils Absolute: 0 x10E3/uL (ref 0.0–0.2)
Basos: 0 %
EOS (ABSOLUTE): 0.1 x10E3/uL (ref 0.0–0.4)
Eos: 2 %
Hematocrit: 48.4 % (ref 37.5–51.0)
Hemoglobin: 15.8 g/dL (ref 13.0–17.7)
Immature Grans (Abs): 0 x10E3/uL (ref 0.0–0.1)
Immature Granulocytes: 0 %
Lymphocytes Absolute: 2 x10E3/uL (ref 0.7–3.1)
Lymphs: 32 %
MCH: 30.9 pg (ref 26.6–33.0)
MCHC: 32.6 g/dL (ref 31.5–35.7)
MCV: 95 fL (ref 79–97)
Monocytes Absolute: 0.7 x10E3/uL (ref 0.1–0.9)
Monocytes: 11 %
Neutrophils Absolute: 3.5 x10E3/uL (ref 1.4–7.0)
Neutrophils: 54 %
Platelets: 233 x10E3/uL (ref 150–450)
RBC: 5.12 x10E6/uL (ref 4.14–5.80)
RDW: 11.8 % (ref 11.6–15.4)
WBC: 6.4 x10E3/uL (ref 3.4–10.8)

## 2023-12-18 MED ORDER — PANTOPRAZOLE SODIUM 40 MG PO TBEC: 40.0000 mg | DELAYED_RELEASE_TABLET | Freq: Two times a day (BID) | ORAL | 3 refills | Status: DC

## 2023-12-18 NOTE — Progress Notes (Signed)
 BP 124/76   Pulse 76   Ht 5' 11 (1.803 m)   Wt 217 lb (98.4 kg)   SpO2 96%   BMI 30.27 kg/m    Subjective:   Patient ID: Jason Morris, male    DOB: Aug 01, 1975, 48 y.o.   MRN: [id number]  HPI: Jason Morris is a 47 y.o. male presenting on 12/18/2023 for Hospitalization Follow-up (Cardiac, new meds, has cardio f/u)   Discussed the use of AI scribe software for clinical note transcription with the patient, who gave verbal consent to proceed.  History of Present Illness   Jason Morris is a 48 year old male with coronary artery disease and acute inferior STEMI who presents for hospital follow-up.  Chest pain and myocardial infarction - Hospitalized from December 04, 2023, to December 06, 2023, for late presenting acute inferior ST-elevation myocardial infarction with right ventricular involvement and acute systolic heart failure. - Experienced recurrent chest pain prompting re-evaluation; blockages were found but no stent was placed due to an aneurysmal area in the right coronary artery. - Significant episode of nocturnal chest pain initially thought to be indigestion, which woke him from sleep and persisted during physical activity. - Upon ambulance arrival, blood pressure was 60/40 mmHg; received nitroglycerin  and fentanyl  for pain relief. - Heart rate stabilized and pain subsided after medications were stopped. - Told that the heart muscle had some damage, but the affected side was still receiving oxygen  due to collateral circulation. - Premature ventricular contractions noted during hospitalization.  Cardiac catheterization and medical therapy - Underwent cardiac catheterization on December 03, 2023. - Continues on aspirin , Plavix , and Crestor . 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  Orthostatic symptoms and functional status - Experiences dizziness when standing, attributed to new medications affecting blood pressure. - Symptoms have improved over the past few days,  with less dizziness and increased strength. - Has returned to work and is able to perform physical activities without significant issues, though occasionally experiences symptoms when walking long distances.  Gastrointestinal symptoms and proton pump inhibitor therapy - Inquires about discontinuation of Nexium , as he feels Protonix  is not as effective.          Relevant past medical, surgical, family and social history reviewed and updated as indicated. Interim medical history since our last visit reviewed. Allergies and medications reviewed and updated.  Review of Systems  Constitutional:  Negative for chills and fever.  Eyes:  Negative for visual disturbance.  Respiratory:  Negative for shortness of breath and wheezing.   Cardiovascular:  Positive for chest pain. Negative for leg swelling.  Musculoskeletal:  Negative for back pain and gait problem.  Skin:  Negative for rash.  Neurological:  Negative for dizziness and numbness.  All other systems reviewed and are negative.   Per HPI unless specifically indicated above   Allergies as of 12/18/2023       Reactions   Lipitor [atorvastatin ] Hives   Lopressor  [metoprolol  Tartrate] Shortness Of Breath   Felt like he was going to die , could not breath & syncope   Egg Protein-containing Drug Products Other (See Comments)   Really bad stomach pain   Lactose Intolerance (gi) Diarrhea        Medication List        Accurate as of December 18, 2023  9:24 AM. If you have any questions, ask your nurse or doctor.          STOP taking these medications    esomeprazole  40  MG capsule Commonly known as: NEXIUM  Stopped by: Fonda LABOR Pressley Barsky       TAKE these medications    aspirin  EC 81 MG tablet Take 1 tablet (81 mg total) by mouth daily. Swallow whole.   clopidogrel  75 MG tablet Commonly known as: PLAVIX  Take 1 tablet (75 mg total) by mouth daily.   Jardiance  10 MG Tabs tablet Generic drug: empagliflozin  Take 1  tablet (10 mg total) by mouth daily before breakfast.   multivitamin tablet Take 1 tablet by mouth daily.   nitroGLYCERIN  0.4 MG SL tablet Commonly known as: Nitrostat  Place 1 tablet (0.4 mg total) under the tongue every 5 (five) minutes as needed for chest pain.   pantoprazole  40 MG tablet Commonly known as: PROTONIX  Take 1 tablet (40 mg total) by mouth 2 (two) times daily before a meal. Started by: Fonda LABOR Rosiland Sen   PARoxetine  40 MG tablet Commonly known as: PAXIL  Take 1 tablet (40 mg total) by mouth daily. What changed: when to take this   rosuvastatin  40 MG tablet Commonly known as: CRESTOR  Take 1 tablet (40 mg total) by mouth at bedtime.   sildenafil  20 MG tablet Commonly known as: REVATIO  Take 1-5 tablets (20-100 mg total) by mouth as needed. What changed:  when to take this reasons to take this   spironolactone  25 MG tablet Commonly known as: ALDACTONE  Take 0.5 tablets (12.5 mg total) by mouth daily.   TIRZEPATIDE [place] Inject 68 Units into the skin See admin instructions. Niancinamode-tirzepadide injection; compounded. Inject 68 units into the skin once a week - may titrate up to 91 units once a week.   traZODone  100 MG tablet Commonly known as: DESYREL  Take 0.5-1 tablets (50-100 mg total) by mouth at bedtime as needed for sleep.   varenicline  1 MG tablet Commonly known as: CHANTIX  Take 1 tablet (1 mg total) by mouth 2 (two) times daily.         Objective:   BP 124/76   Pulse 76   Ht 5' 11 (1.803 m)   Wt 217 lb (98.4 kg)   SpO2 96%   BMI 30.27 kg/m   Wt Readings from Last 3 Encounters:  12/18/23 217 lb (98.4 kg)  12/06/23 214 lb 15.2 oz (97.5 kg)  11/29/23 216 lb 6.4 oz (98.2 kg)    Physical Exam Vitals and nursing note reviewed.  Constitutional:      Appearance: Normal appearance.  Neurological:     Mental Status: He is alert.    Physical Exam   CHEST: Lungs clear to auscultation bilaterally. CARDIOVASCULAR: Regular heart rate and  rhythm, no murmurs.         Assessment & Plan:   Problem List Items Addressed This Visit       Cardiovascular and Mediastinum   CAD (coronary artery disease) - Primary   Relevant Orders   CBC With Diff/Platelet   CMP14+EGFR   STEMI involving right coronary artery (HCC)   Relevant Orders   CBC With Diff/Platelet   CMP14+EGFR       Late presenting acute inferior STEMI with right ventricular involvement and acute systolic heart failure Recent hospitalization for acute inferior STEMI with right ventricular involvement and acute systolic heart failure. Coronary catheterization showed blockages; stenting not performed due to aneurysmal changes. Heart muscle shows some damage but functions adequately. Blood flow improved, heart pumping well. Intermittent chest pain managed with nitroglycerin . Blood pressure low during hospitalization, symptoms improved. Cardiologist follow-up scheduled. - Continue aspirin , Plavix , and Crestor . - Use  nitroglycerin  for chest pain as needed. - Follow up with cardiologist on Friday. - Monitor for chest pain, seek immediate medical attention if it occurs. - Ensure adequate hydration.  Coronary artery aneurysm Identified during catheterization, too large for stenting, managed medically. Potential for clot passage and dissolution discussed. Symptoms include intermittent chest pain and low blood pressure, improved. Advised to monitor symptoms and seek timely medical attention. - Continue current medical management. - Monitor for chest pain, seek medical attention if it occurs. - Follow up with cardiologist for further evaluation.          Follow up plan: Return if symptoms worsen or fail to improve.  Counseling provided for all of the vaccine components Orders Placed This Encounter  Procedures   CBC With Diff/Platelet   CMP14+EGFR    Fonda Levins, MD [facility] 12/18/2023, 9:24 AM

## 2023-12-18 NOTE — Telephone Encounter (Signed)
 Copied from CRM [id number]. Topic: Clinical - Medication Question >> Dec 18, 2023 11:47 AM Tysheama G wrote: Reason for CRM: Patient is wanting Dr.Dettinger to prescribe him a blood sugar monitor so he doesn't have to prick his finger. Callback number [phone]

## 2023-12-19 ENCOUNTER — Other Ambulatory Visit: Payer: Self-pay

## 2023-12-19 MED ORDER — FREESTYLE LIBRE 3 PLUS SENSOR MISC: 3 refills | Status: AC

## 2023-12-19 MED ORDER — FREESTYLE LIBRE 2 PLUS SENSOR MISC: 3 refills | Status: DC

## 2023-12-19 NOTE — Telephone Encounter (Signed)
 That is fine to send the freestyle libre monitor for him to the pharmacy, just let him know that his insurance will not likely cover it as they only cover freestyle libre systems if on insulin  for diabetes.  Self-pay price for it which is somewhere around $75 a month I believe.

## 2023-12-19 NOTE — Telephone Encounter (Signed)
 No answer and vmail not set up. Libre sent to [facility].

## 2023-12-20 ENCOUNTER — Other Ambulatory Visit (HOSPITAL_COMMUNITY): Payer: Self-pay

## 2023-12-20 ENCOUNTER — Ambulatory Visit: Payer: Self-pay | Admitting: Physician Assistant

## 2023-12-20 ENCOUNTER — Ambulatory Visit: Payer: Self-pay | Attending: Cardiology | Admitting: Cardiology

## 2023-12-20 ENCOUNTER — Telehealth: Payer: Self-pay

## 2023-12-20 ENCOUNTER — Telehealth: Payer: Self-pay | Admitting: *Deleted

## 2023-12-20 ENCOUNTER — Other Ambulatory Visit: Payer: Self-pay | Admitting: *Deleted

## 2023-12-20 VITALS — BP 110/66 | HR 66 | Ht 71.0 in | Wt 218.0 lb

## 2023-12-20 DIAGNOSIS — Z720 Tobacco use: Secondary | ICD-10-CM

## 2023-12-20 DIAGNOSIS — E1159 Type 2 diabetes mellitus with other circulatory complications: Secondary | ICD-10-CM

## 2023-12-20 DIAGNOSIS — I152 Hypertension secondary to endocrine disorders: Secondary | ICD-10-CM

## 2023-12-20 DIAGNOSIS — E66811 Obesity, class 1: Secondary | ICD-10-CM

## 2023-12-20 DIAGNOSIS — I509 Heart failure, unspecified: Secondary | ICD-10-CM

## 2023-12-20 DIAGNOSIS — E1169 Type 2 diabetes mellitus with other specified complication: Secondary | ICD-10-CM

## 2023-12-20 DIAGNOSIS — I2510 Atherosclerotic heart disease of native coronary artery without angina pectoris: Secondary | ICD-10-CM

## 2023-12-20 DIAGNOSIS — I2111 ST elevation (STEMI) myocardial infarction involving right coronary artery: Secondary | ICD-10-CM

## 2023-12-20 DIAGNOSIS — E785 Hyperlipidemia, unspecified: Secondary | ICD-10-CM

## 2023-12-20 MED ORDER — SPIRONOLACTONE 25 MG PO TABS: 25.0000 mg | ORAL_TABLET | Freq: Every day | ORAL | 3 refills | Status: AC

## 2023-12-20 NOTE — Progress Notes (Signed)
 Cardiology Office Note:  .   Date:  12/22/2023  ID:  Jason Morris, DOB [birth year], MRN [id number] PCP: Dettinger, Fonda LABOR, MD  [facility] Providers Cardiologist:  Alm Clay, MD     Chief Complaint  Patient presents with   Hospitalization Follow-up    First hospital visit post MI   Coronary Artery Disease    Inferior STEMI with occluded RCA.  No further angina   Cardiomyopathy    EF 40 to 45% post MI no heart failure symptoms.    Patient Profile: .     Jason Morris is a formerly obese 48 y.o. male heavy smoker although cut down to 1/2 pack a day from 3 pack a day) with a PMH notable for unusual CAD now with inferior STEMI and ischemic cardiomyopathy who presents here for hospital follow-up at the request of Dettinger, Fonda LABOR, MD.  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 Non-STEMI in 2016 revealed an EF with no significant stenosis.  Medical management. 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 DM-2 GERD   QUANDARIUS NILL was evaluated in 2014 with a chest CT showing aneurysmal dilation of the RCA reaching 12 mm diameter with extensive mixed plaque throughout the RCA and 2 moderate stenosis of roughly 70% in the proximal RCA and ostial PDA at 50%.  CAC score 3557.  Ectatic LAD without stenosis.  Small LCx system.  This was followed up with a Myoview stress test which showed no evidence of ischemia or infarction with normal LV size and function. Echocardiogram was also normal.   He underwent cardiac catheterization in February 2016 for elevated troponin => evaluation for inflammatory condition with ESR rheumatoid factor and ANA etc. were all  relatively normal. He was followed for couple years by Dr. Pietro last seen by him in March or 2017.  He then saw Herlene Canterbury, [place] in January 2018.  No chest pain noted reduced libido with Crestor , and LDL was 114.  Still smoking 1-1/2 packs a day and gained 20 pounds.  Noted rash to Lipitor, Pravachol  ineffective and so that Crestor  was causing side effects. => Referred to [facility] for trial of PCSK9 inhibitor. => He was seen by Allean Mink, RPH- CCP and [facility].  They discussed Repatha  but cost being a major issue.  Also discussed trying to reduce from 3 packs a day to less than 1 pack a day.  Also just weight loss.  That was the last visit he had clinic.     ER 11/25/2023: He presented to [facility] on 11/25/2023 with chest pain-centralized chest/epigastric pain rating to left arm off-and-on for 4 days.  He was not sure if this was GERD or angina.  Pain described as a squeezing sensation I did not notice any correlation with eating.  He did state that the GI cocktail may be helped some.  He ruled out for MI and was discharged home on PPI Seen by Dr. Maryanne on 11/29/2023 => he had been able to get tirzepatide formulation of 7.5 mg dose from [facility] (a compound pharmacy).  They discussed GERD switching from omeprazole  to esomeprazole .  Indicated that Dexilant  was not helpful in the past. Admission 11/28/2023: Inferior STEMI => late presenting (after 9 hours of pain).  Unfortunately, the extremely aneurysmal RCA was now occluded and not felt to be favorable to PCI.  There were already collaterals to PDA.  EF 40 to 45% with basal mid inferior akinesis.  Because of blood pressure dropping off nitroglycerin , he briefly was on norepinephrine , and during that interval he also had episodes of NSVT temporarily requiring amiodarone . He was discharged on aspirin  and Plavix  as well as Crestor  40 mL daily, 12.5 mg spironolactone  and Jardiance  10 mg.  BP did not tolerate further  titration. Smoking cessation counseling.   Subjective  Discussed the use of AI scribe software for clinical note transcription with the patient, who gave verbal consent to proceed.  History of Present Illness Jason Morris is a 48 year old male with a history of myocardial infarction, diabetes, hypertension, and hyperlipidemia who presents for a cardiology follow-up after a recent heart attack.  He is following up after a heart attack that occurred on December 04, 2023, during which he was hospitalized. He is confused about the cause of the heart attack, especially since he had lost over 100 pounds and nearly quit smoking. He was started on Nexium  for heartburn a week before the heart attack, which he initially thought was indigestion. The pain was described as a 'spear in the middle of my chest all the way through to my back.'  He has a history of diabetes, hypertension, and hyperlipidemia. He has been on Plavix  since 2015 and Crestor , which was recently increased from 20 mg to 40 mg. He also takes Protonix  instead of Nexium  to avoid interaction with Plavix . He is on spironolactone  for blood pressure management, taking a full tablet. He was previously on lisinopril  but is not currently taking it due to low blood pressure.  He has a history of smoking but has reduced his intake to less than a pack a day. He is not fully quit and has not resumed Chantix  due to uncertainty about its role in his heart attack. He is self-paying for medications and has been managing his diabetes with Mounjaro, which was increased to 91 mg, and Jardiance , which he finds costly.  He experiences episodes of dizziness and hypoglycemia, especially after losing weight. He had a significant episode of weakness and sweating while working, which led to his hospitalization. He describes a history of feeling weak and nearly passing out, with blood sugar readings as low as 40 mg/dL.  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 He returned to work shortly after his heart attack but has been taking it easy. He reports feeling stronger and having more energy post-heart attack, despite the recent event.   Cardiovascular ROS: no chest pain or dyspnea on exertion positive for - actually feels stronger than he did pre-MI.  He lost a significant weight recently-likely related to his appetite.  He notes dizziness when his blood sugars get low but his blood pressures are usually been okay. negative for - edema, irregular heartbeat, orthopnea, palpitations, paroxysmal nocturnal dyspnea, rapid heart rate, shortness of breath, or syncope/near syncope or TIA/amaurosis fugax, claudication  ROS:  Review of Systems - Negative except symptoms noted above.    Objective   Discharge Medications: DAPT: ASA 81 mg daily, Plavix  75 mg daily; PRN NTG Jardiance  10 daily Rosuvastatin  40 mg Daily Spironolactone  12.5 Miller daily Pantoprazole  40 mL daily Paroxetine  40 Owen daily Tirzepatide 68 units, consider titration up to 91 Chantix  Trazodone  100 mg tablet-1/2 to 1 tablet nightly for sleep  Statin Myalgia: 80 mg atorvastatin  caused hives and myalgias, rosuvastatin  40 mg caused myalgia less prominent of 20 mg but also noted decreased libido.  Pravastatin  ineffective at reducing lipids.  Family history That: Father died of heart disease at age 53 and mother has coronary stents.  Studies Reviewed: SABRA   EKG Interpretation Date/Time:  Friday December 20 2023 11:16:38 EST Ventricular Rate:  66 PR Interval:  144 QRS Duration:  92 QT Interval:  404 QTC Calculation: 423 R Axis:   63  Text Interpretation: Normal sinus rhythm T wave abnormality, consider inferior ischemia When compared with ECG of 06-Dec-2023 07:23, No significant change was found Confirmed by Anner Lenis ([id number]) on 12/22/2023 12:13:24 PM    Lab Results  Component Value Date   CHOL 106 12/05/2023   HDL 34 (L) 12/05/2023    LDLCALC 60 12/05/2023   LDLDIRECT 151.2 03/02/2013   TRIG 58 12/05/2023   CHOLHDL 3.1 12/05/2023   Lab Results  Component Value Date   NA 138 12/18/2023   K 4.8 12/18/2023   CREATININE 1.14 12/18/2023   EGFR 80 12/18/2023   GLUCOSE 86 12/18/2023   Lab Results  Component Value Date   NA 138 12/18/2023   K 4.8 12/18/2023   CREATININE 1.14 12/18/2023   EGFR 80 12/18/2023   GLUCOSE 86 12/18/2023   Lab Results  Component Value Date   HGBA1C 5.6 12/04/2023   Studies from Recent Hospitalization Echocardiogram: Mild reduced LV function with a EF of 40 to 45%.  Inferior, mid inferoseptal and basal inferoseptal wall are akinetic.  Mildly reduced RV function with mildly elevated RAP but unable to assess RVSP/PAP.  Normal aortic and mitral valves.  (12/04/2023)   CATH: Inferior STEMI -culprit LESION: Occlusion of the proximal RCA. This vessel is severely aneurysmal, calcified and extremely tortuous; 85% stenosis in small caliber first diagonal; Aneurysmal LAD; Moderate LV dysfunction. EF 40-45% with basal to mid inferior akinesis; Mildly elevated LVEDP 18 mm Hg = Plan: recommend medical management. Reviewed prior cath study from 2016. (12/04/2023)    Previous Studies  Cardiac Cath: Severe aneurysmal dilation RCA up to 10 to 12 mm diameter) as well as LAD and LCx without flow-limiting lesions.  Normal LV systolic function.  Newly elevated troponin.  (02/21/2014) Treadmill Myoview: Exercised 11:30 minutes reaching 13.7 METS.  No Ischemia or Infarction normal LVEF.  (11/25/2013) Coronary CT Angiogram: Large aneurysmal dominant RCA reaching up to 12 mm diameter with extensive mixed plaque throughout.  2 serial moderate stenoses in the proximal RCA as well as 50% ostial PDA.  Ectatic LAD without stenosis.  Small LCx.  (08/13/2012) Echocardiogram: Normal LV size and function with EF 55 to 60%.  No RWMA.  Normal diastolic parameters but mildly elevated LA.  (08/09/2012)  Risk  Assessment/Calculations:           Physical Exam:   VS:  BP 110/66 (BP Location: Right Arm, Patient Position: Sitting, Cuff Size: Normal)   Pulse 66   Ht 5' 11 (1.803 m)   Wt 218 lb (98.9 kg)   SpO2 99%   BMI 30.40 kg/m    Wt Readings from Last 3 Encounters:  12/20/23 218 lb (98.9 kg)  12/18/23 217 lb (98.4 kg)  12/06/23 214 lb 15.2 oz (97.5 kg)      GEN: Well nourished, well groomed; in no acute distress; mildly obese but seems relatively healthy NECK: No JVD; No carotid bruits CARDIAC: Normal S1, S2; RRR, no murmurs, rubs, gallops RESPIRATORY:  Clear to auscultation  without rales, wheezing or rhonchi ; nonlabored, good air movement. ABDOMEN: Soft, non-tender, non-distended EXTREMITIES:  No edema; No deformity     ASSESSMENT AND PLAN: .    Coronary artery disease involving native heart without angina pectoris Extensive calcification and occlusion of right coronary artery, too large for stenting. Left coronary artery has smaller branches with some disease. Collateral circulation present. - Continue current medications including DAPT with aspirin  81 mg and Plavix  75 mg. - Gradually titrate GDMT medications are limited by low blood pressure): He has actually been taking a full 25 mg spironolactone  so we will with a prescription for 25 mg daily As pressure tolerates, would likely add afterload reduction with low-dose losartan or valsartan before considering beta-blocker He seems to be doing okay with rosuvastatin  40 mg daily - Consider surveillance testing in the future.  STEMI involving right coronary artery (HCC) Acute myocardial infarction with chronic myocardial scar and heart failure with reduced ejection fraction (EF 40-45%) Recent myocardial infarction with significant right coronary artery occlusion. Chronic myocardial scar in the inferior wall. Right coronary artery too large and calcified for intervention.  Collateral circulation present.  Heart compensating well but  risk of further damage with intense exertion. - Advised against heavy lifting and strenuous activity until mid-January. - Encouraged gradual increase in activity by mid-January. - Continue aspirin  and Plavix . - Monitor for symptoms of angina or heart failure exacerbation.  ACC/AHA stage C congestive heart failure due to ischemic cardiomyopathy (HCC) Mild to moderate reduced EF not unexpected based on the distribution of the RCA territory and very small LCx.  Thankfully, he is not really having any active heart failure symptoms.  NYHA class II. Titration of GDMT limited by low blood pressure. Continue titration of spironolactone  to 25 mg daily He probably needs medication assistance to be other maintain coverage with Jardiance  10 mg (will seek assistance through [facility]) Close follow-up to determine if BP will tolerate addition of ARB-would probably start with low-dose losartan, and if heart rate would tolerate, consider low-dose beta-blocker He is feeling pretty well today and I am leery of pushing medications too far today especially with having dizziness episodes.. Euvolemic.  Not on diuretic.  Not likely to require further echocardiogram as this area is likely not going to recover with no revascularization.  Hypertension associated with diabetes (HCC) Blood pressure is borderline low now on spironolactone  which we are titrating to 25 mg daily. Plan close follow-up, and adding low-dose losartan 12.5 to 25 mg plus or minus low-dose Toprol  Toprol  5 mg at next follow-up.   Hyperlipidemia associated with type 2 diabetes mellitus (HCC) Long-standing hyperlipidemia managed with Crestor . Recent dosage increase to 40 mg for better cholesterol control. - Continue Crestor  at increased dosage of 40 mg.  In the past, he has had some intolerance issues with Crestor  but seems to be doing okay right now.  Monitor closely.  Type 2 diabetes with recent weight loss and improved A1c (5.6%). On Mounjaro and  Jardiance . Blood sugar levels variable with hypoglycemia episodes. - Continue Mounjaro and Jardiance . - Monitor blood sugar levels closely. - Coordinate with pharmacy for medication management and cost reduction.  Tobacco abuse Nicotine  dependence with current smoking less than a pack per day. Previous Chantix  use with unclear impact. Encouraged to quit smoking to reduce cardiovascular risk. - Encouraged smoking cessation using nicotine  patches or other aids. - Discussed potential risks of continued smoking on cardiovascular health.  5 minutes  Obesity (BMI 30.0-34.9) Significant weight loss with the tirzepatide.  Management PCP.  Cardiovascular and Mediastinum I spent quite a bit of time reviewing his Films and CT scan results as well as echo results.  We talked about his unusual anatomy and the fact that despite having he was doing positively including cutting down cigarettes, weight loss and increasing his exercise, he still had an MI.  He feels a whole lot better and is wanting to get back to full work and I would caution him to take this slowly.  I did explain to him the reduced ejection fraction and that he may have some symptoms of fatigue and dyspnea.  We reviewed the natural history of this condition and the importance of being on titrate medications. He indicated difficulties with cost in the past-noting right now that Jardiance  is almost cost prohibitive.  In the past PCSK9 inhibitors were not available.  Prolonged visit.           Follow-Up: Return in about 2 months (around 02/20/2024) for Follow-up with APP, 3-4 month follow-up, Routine follow up with me. => Plan will be to determine blood pressure room to titrate GDMT-adding ARB and low-dose beta-blocker.  I spent 98 minutes in the care of GARYN ARLOTTA today including reviewing labs (2 minutes), reviewing studies (I personally reviewed his Films both from 2016 as well as his recent STEMI films, I reviewed his Coronary CTA  from 2014 as well as the echocardiogram and Myoview at that same time.  And then also reviewed his current Myoview-15 minutes), reviewing outside studies (N/A), face to face time discussing treatment options (45 minutes), reviewing records from his recent hospitalization as well as several clinic notes (18 minutes), 18 minutes dictating, and documenting in the encounter.      Signed, Alm MICAEL Clay, MD, MS Alm Clay, M.D., M.S. Interventional Cardiologist  [facility] Pager # [phone]

## 2023-12-20 NOTE — Patient Instructions (Signed)
 Medication Instructions:   Increase Spironolactone  25 mg daily     *If you need a refill on your cardiac medications before your next appointment, please call your pharmacy*   Lab Work:fasting in 4 months ( April 2026 prior to appointment with Dr Anner)  LIPID CMP If you have labs (blood work) drawn today and your tests are completely normal, you will receive your results only by: MyChart Message (if you have MyChart) OR A paper copy in the mail If you have any lab test that is abnormal or we need to change your treatment, we will call you to review the results.   Testing/Procedures: Not needed   Follow-Up: At [facility], you and your health needs are our priority.  As part of our continuing mission to provide you with exceptional heart care, we have created designated Provider Care Teams.  These Care Teams include your primary Cardiologist (physician) and Advanced Practice Providers (APPs -  Physician Assistants and Nurse Practitioners) who all work together to provide you with the care you need, when you need it.     Your next appointment:   2 month(s)  The format for your next appointment:   In Person  Provider:   Josefa Beauvais, NP or Lum Louis, NP      Then, Alm Anner, MD will plan to see you again in 4 month(s).   Other Instructions    It will be okay to increase to full activity in the middle of Jan 2026 (@ 15th)

## 2023-12-20 NOTE — Telephone Encounter (Signed)
 PAP: Patient assistance application for Jardiance  through [employer]) has been mailed to pt's home address on file. Provider portion of application will be faxed to provider's office once pt portion has been received.

## 2023-12-20 NOTE — Telephone Encounter (Signed)
 Patient states he can not afford Jardiance . He had an appointment with Dr Anner today  12/20/23   This message sent to Med assistance Team  to assist if possible

## 2023-12-22 ENCOUNTER — Encounter: Payer: Self-pay | Admitting: Cardiology

## 2023-12-22 DIAGNOSIS — I255 Ischemic cardiomyopathy: Secondary | ICD-10-CM | POA: Insufficient documentation

## 2023-12-22 MED ORDER — EMPAGLIFLOZIN 10 MG PO TABS: 10.0000 mg | ORAL_TABLET | Freq: Every day | ORAL | 0 refills | Status: AC

## 2023-12-22 NOTE — Assessment & Plan Note (Addendum)
 Nicotine  dependence with current smoking less than a pack per day. Previous Chantix  use with unclear impact. Encouraged to quit smoking to reduce cardiovascular risk. - Encouraged smoking cessation using nicotine  patches or other aids. - Discussed potential risks of continued smoking on cardiovascular health.  5 minutes

## 2023-12-22 NOTE — Assessment & Plan Note (Signed)
 I spent quite a bit of time reviewing his Films and CT scan results as well as echo results.  We talked about his unusual anatomy and the fact that despite having he was doing positively including cutting down cigarettes, weight loss and increasing his exercise, he still had an MI.  He feels a whole lot better and is wanting to get back to full work and I would caution him to take this slowly.  I did explain to him the reduced ejection fraction and that he may have some symptoms of fatigue and dyspnea.  We reviewed the natural history of this condition and the importance of being on titrate medications. He indicated difficulties with cost in the past-noting right now that Jardiance  is almost cost prohibitive.  In the past PCSK9 inhibitors were not available.  Prolonged visit.

## 2023-12-22 NOTE — Assessment & Plan Note (Signed)
 Long-standing hyperlipidemia managed with Crestor . Recent dosage increase to 40 mg for better cholesterol control. - Continue Crestor  at increased dosage of 40 mg.  In the past, he has had some intolerance issues with Crestor  but seems to be doing okay right now.  Monitor closely.  Type 2 diabetes with recent weight loss and improved A1c (5.6%). On Mounjaro and Jardiance . Blood sugar levels variable with hypoglycemia episodes. - Continue Mounjaro and Jardiance . - Monitor blood sugar levels closely. - Coordinate with pharmacy for medication management and cost reduction.

## 2023-12-22 NOTE — Assessment & Plan Note (Signed)
 Acute myocardial infarction with chronic myocardial scar and heart failure with reduced ejection fraction (EF 40-45%) Recent myocardial infarction with significant right coronary artery occlusion. Chronic myocardial scar in the inferior wall. Right coronary artery too large and calcified for intervention.  Collateral circulation present.  Heart compensating well but risk of further damage with intense exertion. - Advised against heavy lifting and strenuous activity until mid-January. - Encouraged gradual increase in activity by mid-January. - Continue aspirin  and Plavix . - Monitor for symptoms of angina or heart failure exacerbation.

## 2023-12-22 NOTE — Assessment & Plan Note (Signed)
 Extensive calcification and occlusion of right coronary artery, too large for stenting. Left coronary artery has smaller branches with some disease. Collateral circulation present. - Continue current medications including DAPT with aspirin  81 mg and Plavix  75 mg. - Gradually titrate GDMT medications are limited by low blood pressure): He has actually been taking a full 25 mg spironolactone  so we will with a prescription for 25 mg daily As pressure tolerates, would likely add afterload reduction with low-dose losartan or valsartan before considering beta-blocker He seems to be doing okay with rosuvastatin  40 mg daily - Consider surveillance testing in the future.

## 2023-12-22 NOTE — Assessment & Plan Note (Signed)
 Mild to moderate reduced EF not unexpected based on the distribution of the RCA territory and very small LCx.  Thankfully, he is not really having any active heart failure symptoms.  NYHA class II. Titration of GDMT limited by low blood pressure. Continue titration of spironolactone  to 25 mg daily He probably needs medication assistance to be other maintain coverage with Jardiance  10 mg (will seek assistance through [facility]) Close follow-up to determine if BP will tolerate addition of ARB-would probably start with low-dose losartan, and if heart rate would tolerate, consider low-dose beta-blocker He is feeling pretty well today and I am leery of pushing medications too far today especially with having dizziness episodes.. Euvolemic.  Not on diuretic.  Not likely to require further echocardiogram as this area is likely not going to recover with no revascularization.

## 2023-12-22 NOTE — Assessment & Plan Note (Signed)
 Blood pressure is borderline low now on spironolactone  which we are titrating to 25 mg daily. Plan close follow-up, and adding low-dose losartan 12.5 to 25 mg plus or minus low-dose Toprol  Toprol  5 mg at next follow-up.

## 2023-12-22 NOTE — Assessment & Plan Note (Signed)
 Significant weight loss with the tirzepatide.  Management PCP.

## 2023-12-23 ENCOUNTER — Telehealth: Payer: Self-pay | Admitting: Family Medicine

## 2023-12-23 MED ORDER — DEXLANSOPRAZOLE 30 MG PO CPDR: 30.0000 mg | DELAYED_RELEASE_CAPSULE | Freq: Every day | ORAL | 1 refills | Status: AC

## 2023-12-23 NOTE — Telephone Encounter (Signed)
 This has been addressed in another telephone counter today.

## 2023-12-23 NOTE — Telephone Encounter (Signed)
 Copied from CRM [id number]. Topic: Clinical - Refused Triage >> Dec 23, 2023  4:14 PM Alfonso ORN wrote: Patient/caller voiced complaints of patient . Declined transfer to triage.  pain in chest when burp and cramping  in chest when move certain way   ----------------------------------------------------------------------- From previous Reason for Contact - Red Word Triage: Red Word that prompted transfer to Nurse Triage:

## 2023-12-23 NOTE — Telephone Encounter (Signed)
 I sent Dexilant  for him and he can see if it could be covered, it is usually not covered very well.

## 2023-12-23 NOTE — Telephone Encounter (Signed)
 Copied from CRM [id number]. Topic: Clinical - Medication Question >> Dec 23, 2023 12:00 PM DeAngela L wrote: Reason for CRM: the patient spoke with Dr Dettinger about taking pantoprazole  (PROTONIX ) and it makes the patient chest feel sore and this is not effective, the patient Cardiologist recommended Dexilant  if this works for the patient and the [employer] could work with him on the price, the patient is asking if he have a prescription for a 30 day supply cause this would be in his price range  Pt num Mobile [phone]  [facility] [place], [place] - [address] [place] [address] Phone: [phone] Fax: [phone]

## 2023-12-23 NOTE — Telephone Encounter (Signed)
 Approved 12/16/23-12/15/24    [id number]        I called the patient and his vm box is not set up. [employer] will be calling him to confirm his address and shipment. He will have to talk to them before this will ship. I also sent a mychart message to him about this approval on 12/17/23 that he has not read yet

## 2023-12-23 NOTE — Telephone Encounter (Signed)
 Pt made aware and understood. States that the dexilant  is going to be $175 per month. Pt is ok paying that.

## 2023-12-23 NOTE — Telephone Encounter (Signed)
 SABRA

## 2023-12-23 NOTE — Telephone Encounter (Signed)
 Copied from CRM #[id number]. Topic: Clinical - Medication Refill >> Dec 23, 2023  4:07 PM Alfonso ORN wrote: Medication: Dexilant   (Cardioligist recommended and there is no interaction with other medication pt is on ) Has the patient contacted their pharmacy? Yes (Agent: If no, request that the patient contact the pharmacy for the refill. If patient does not wish to contact the pharmacy document the reason why and proceed with request.) (Agent: If yes, when and what did the pharmacy advise?)  This is the patient's preferred pharmacy:  [facility] [place], [place] - [address] [place] [address] Phone: [phone] Fax: [phone]   Is this the correct pharmacy for this prescription? Yes If no, delete pharmacy and type the correct one.   Has the prescription been filled recently? No  Is the patient out of the medication? Yes  last time taken was in 2017   Has the patient been seen for an appointment in the last year OR does the patient have an upcoming appointment? Yes  Can we respond through MyChart? Yes  Agent: Please be advised that Rx refills may take up to 3 business days. We ask that you follow-up with your pharmacy.

## 2023-12-23 NOTE — Telephone Encounter (Unsigned)
 Copied from CRM [id number]. Topic: Clinical - Refused Triage >> Dec 23, 2023  4:14 PM Alfonso ORN wrote: Patient/caller voiced complaints of patient . Declined transfer to triage.  pain in chest when burp and cramping  in chest when move certain way   ----------------------------------------------------------------------- From previous Reason for Contact - Red Word Triage: Red Word that prompted transfer to Nurse Triage:

## 2023-12-23 NOTE — Telephone Encounter (Signed)
 Copied from CRM #[id number]. Topic: Clinical - Prescription Issue >> Dec 23, 2023 11:53 AM Zane F wrote: Reason for CRM:    Patient called in to provide PCP an update from his cardiologist due to MyChart not allowing the patient to send update. Patient was informed he could be prescribed Dexilant  (dexlansoprazole ) but the Protonix  and Nexium  cannot be used due to the patient being on Plavix . (Sidenote: Patient mentioned the two prescriptions can cause severe side effects and even death). Please follow up with patient for additional information.   Please assist the patient in sending over the prescription requested to his preferred pharmacy listed below.   Preferred Pharmacy: [facility] [address] [place] [address] Phone: [phone]  Fax: [phone] DEA #: [id number]   Callback Number: [phone]

## 2023-12-23 NOTE — Telephone Encounter (Signed)
 This has been address in a fpl group.

## 2023-12-25 ENCOUNTER — Ambulatory Visit: Payer: Self-pay | Admitting: Family Medicine

## 2023-12-25 NOTE — Telephone Encounter (Signed)
 Voice mail has not been set up

## 2023-12-27 NOTE — Telephone Encounter (Signed)
 Called patient still unable to leave a message .  Rn called patient's mother  Heron  Left message to have patient to call the office or view his mychart for the information He received assistance for  Jardiance  10 mg

## 2023-12-30 NOTE — Telephone Encounter (Signed)
"   Spoke to  patient . He states received  message concerning Jardiance .-patient assistance.   Patient had a question  concerning him taking an increase dose of Mounjaro.  Patient states on his dischagre summary from the  hospital - summary states he was to increase the dose.  He states  he  received to increase medication    RN informed patient to contact  primary who started medication  Also will send to [facility] pharm-D   Patient verbalized understanding "

## 2023-12-30 NOTE — Telephone Encounter (Signed)
 Patient returned RN's call.

## 2024-01-03 NOTE — Telephone Encounter (Signed)
 Called pt advised of PharmD response pt had no further questions or concerns.

## 2024-01-06 ENCOUNTER — Telehealth: Payer: Self-pay | Admitting: Cardiology

## 2024-01-06 DIAGNOSIS — I2510 Atherosclerotic heart disease of native coronary artery without angina pectoris: Secondary | ICD-10-CM

## 2024-01-06 DIAGNOSIS — E1159 Type 2 diabetes mellitus with other circulatory complications: Secondary | ICD-10-CM

## 2024-01-06 NOTE — Telephone Encounter (Signed)
 Patient identification verified by 2 forms.   Called and spoke to patient  Patient states:  -Indigestion worst in the morning -Dizziness when bending over and standing up -In in hot bath tub, will begin to sweat than get dizzy -Sugar around 60s and 70s -Wants to know should he go back on Losartan.            Interventions/Plan: -Will get message to Dr. Anner to review.   Reviewed ED warning signs/precautions  Patient agrees with plan, no questions at this time

## 2024-01-06 NOTE — Telephone Encounter (Signed)
 Pt c/o BP issue: STAT if pt c/o blurred vision, one-sided weakness or slurred speech.  STAT if BP is GREATER than 180/120 TODAY.  STAT if BP is LESS than 90/60 and SYMPTOMATIC TODAY  1. What is your BP concern? High BP   2. Have you taken any BP medication today? 3. What are your last 5 BP readings? Today 150/78 The other day 130/70's (starting to go up higher)  4. Are you having any other symptoms (ex. Dizziness, headache, blurred vision, passed out)? Dizziness   Patient was in the hospital recently and was told to start taking   spironolactone  (ALDACTONE ) 25 MG tablet  Patient was taking Losartan and thinks the Losartan may have worked better with controlling his BP. Please advise.

## 2024-01-07 NOTE — Telephone Encounter (Signed)
Called patient voice mail is full unable to leave a message

## 2024-01-08 NOTE — Telephone Encounter (Signed)
"   spoke to patient another  telephone encounter "

## 2024-01-08 NOTE — Telephone Encounter (Signed)
 Per Dr Court , Would have pt keep a 2 week BP log then F/U with a Pharm D to review and recommend medication changes  JJB  01/07/24      Patient return call.  He states he was calling back from a missed call.RN informed patient of the recommendation for elevated blood pressure per Dr Court.  Patient verbalized understanding. Patient is concerned that his blood  pressure is elevated - it was lower prior to hospitalization - '110s  systolic before they stopped his Lisinopril   in the hospital ( Now 150's systolic.   RN informed patient the reason the blood pressure log is to gather more data  about patient's blood pressure.  Patient verbalized  understanding and agreed to send a blood pressure reading.  Emergency room protocol given.  Patient aware message will be deferred to Dr Anner.

## 2024-02-20 ENCOUNTER — Ambulatory Visit: Payer: Self-pay | Admitting: Emergency Medicine

## 2024-05-28 ENCOUNTER — Encounter: Payer: Self-pay | Admitting: Family Medicine
